# Patient Record
Sex: Female | Born: 1935 | Race: White | Hispanic: No | Smoking: Former smoker
Health system: Southern US, Academic
[De-identification: ages and names within clinical notes are randomized; demographics above are authoritative.]

## PROBLEM LIST (undated history)

## (undated) DIAGNOSIS — M4800 Spinal stenosis, site unspecified: Secondary | ICD-10-CM

## (undated) DIAGNOSIS — M419 Scoliosis, unspecified: Secondary | ICD-10-CM

## (undated) DIAGNOSIS — S022XXA Fracture of nasal bones, initial encounter for closed fracture: Secondary | ICD-10-CM

## (undated) DIAGNOSIS — M549 Dorsalgia, unspecified: Secondary | ICD-10-CM

## (undated) DIAGNOSIS — E041 Nontoxic single thyroid nodule: Secondary | ICD-10-CM

## (undated) DIAGNOSIS — E781 Pure hyperglyceridemia: Secondary | ICD-10-CM

## (undated) DIAGNOSIS — G4700 Insomnia, unspecified: Secondary | ICD-10-CM

## (undated) DIAGNOSIS — E559 Vitamin D deficiency, unspecified: Secondary | ICD-10-CM

## (undated) DIAGNOSIS — M1990 Unspecified osteoarthritis, unspecified site: Secondary | ICD-10-CM

## (undated) DIAGNOSIS — E785 Hyperlipidemia, unspecified: Secondary | ICD-10-CM

## (undated) DIAGNOSIS — M810 Age-related osteoporosis without current pathological fracture: Secondary | ICD-10-CM

## (undated) DIAGNOSIS — M5030 Other cervical disc degeneration, unspecified cervical region: Secondary | ICD-10-CM

## (undated) DIAGNOSIS — M48061 Spinal stenosis, lumbar region without neurogenic claudication: Secondary | ICD-10-CM

## (undated) HISTORY — PX: HX GALL BLADDER SURGERY/CHOLE: SHX55

## (undated) HISTORY — DX: Other cervical disc degeneration, unspecified cervical region: M50.30

## (undated) HISTORY — PX: HX DILATION AND CURETTAGE: SHX78

## (undated) HISTORY — DX: Hyperlipidemia, unspecified: E78.5

## (undated) HISTORY — PX: HX OTHER: 2100001105

## (undated) HISTORY — DX: Spinal stenosis, site unspecified: M48.00

## (undated) HISTORY — DX: Age-related osteoporosis without current pathological fracture: M81.0

## (undated) HISTORY — PX: HX COLONOSCOPY: 2100001147

## (undated) HISTORY — PX: HX TONSILLECTOMY: SHX27

## (undated) HISTORY — DX: Vitamin D deficiency, unspecified: E55.9

## (undated) HISTORY — DX: Pure hyperglyceridemia: E78.1

## (undated) HISTORY — PX: HX CATARACT REMOVAL: SHX102

## (undated) HISTORY — DX: Nontoxic single thyroid nodule: E04.1

## (undated) HISTORY — DX: Fracture of nasal bones, initial encounter for closed fracture: S02.2XXA

## (undated) HISTORY — PX: HX WISDOM TEETH EXTRACTION: SHX21

## (undated) HISTORY — DX: Dorsalgia, unspecified: M54.9

## (undated) HISTORY — PX: HX HIP SURGERY: 2100001310

## (undated) HISTORY — DX: Scoliosis, unspecified: M41.9

## (undated) HISTORY — DX: Spinal stenosis, lumbar region without neurogenic claudication: M48.061

## (undated) HISTORY — DX: Unspecified osteoarthritis, unspecified site: M19.90

## (undated) HISTORY — DX: Insomnia, unspecified: G47.00

## (undated) HISTORY — PX: KNEE ARTHROPLASTY: SHX992

## (undated) HISTORY — PX: TONSILLECTOMY: SUR1361

## (undated) HISTORY — PX: CHOLECYSTECTOMY: SHX55

---

## 2009-02-13 ENCOUNTER — Inpatient Hospital Stay (HOSPITAL_COMMUNITY): Payer: Self-pay | Admitting: Neurological Surgery

## 2011-04-10 ENCOUNTER — Ambulatory Visit (INDEPENDENT_AMBULATORY_CARE_PROVIDER_SITE_OTHER): Payer: Medicare Other

## 2011-04-25 ENCOUNTER — Other Ambulatory Visit (INDEPENDENT_AMBULATORY_CARE_PROVIDER_SITE_OTHER): Payer: Self-pay

## 2011-04-25 ENCOUNTER — Encounter (INDEPENDENT_AMBULATORY_CARE_PROVIDER_SITE_OTHER): Payer: Self-pay | Admitting: Neurological Surgery

## 2012-07-21 ENCOUNTER — Encounter (INDEPENDENT_AMBULATORY_CARE_PROVIDER_SITE_OTHER): Payer: Self-pay | Admitting: Medical

## 2012-08-10 ENCOUNTER — Encounter (INDEPENDENT_AMBULATORY_CARE_PROVIDER_SITE_OTHER): Payer: Self-pay | Admitting: Otolaryngology

## 2012-08-10 ENCOUNTER — Ambulatory Visit (INDEPENDENT_AMBULATORY_CARE_PROVIDER_SITE_OTHER): Payer: Medicare Other | Admitting: Otolaryngology

## 2012-08-10 VITALS — Resp 18 | Ht 62.0 in | Wt 120.0 lb

## 2012-08-10 NOTE — Progress Notes (Signed)
[facility] ENT AND AUDIOLOGY  [facility]  [address] Laurell Ladera [address] [address]  [phone]          Encounter Date: 08/10/2012  9:00 AM EDT      Name: Sheri Garza  Age: 77 y.o.  DOB: 1935-11-05  Sex: female    Chief Complaint:   Chief Complaint   Patient presents with   . Nasal Drainage       HPI    Sheri Garza is a 77 y.o. female who complains of nasal drainage.  She is having problems with thick mucus.  This is causing her difficulty sleeping at night.  She will feel like this is choking her.  She has not tried any nasal sprays or antihistamines. She does not take any diuretics.  She drinks only a couple of glasses of water per day. She reports having had 5-6 episodes that do not last long and is able to be stopped with tissues. She does have a history of nasal trauma in the past.     History  Family History   Problem Relation Age of Onset   . Diabetes     . HTN <20 y.o.     . Cancer       Past Medical History   Diagnosis Date   . Dry eye      Past Surgical History   Procedure Laterality Date   . Hx gall bladder surgery/chole     . Hx dilation and curettage     . Hx other       trigger finger, repair quad     There are no active problems to display for this patient.    Current Outpatient Prescriptions   Medication Sig   . multivitamin Oral Tablet Take 1 Tab by mouth Once a day     History   Smoking status   . Never Smoker    Smokeless tobacco   . Not on file     History   Alcohol Use No     History   Drug Use No       Review of Systems   Constitutional: Negative.    HENT: Negative.    Eyes: Positive for redness.        Positive for dry eye.   Respiratory: Negative.    Cardiovascular: Negative.    Gastrointestinal: Negative.    Genitourinary: Negative.    Musculoskeletal: Negative.    Skin: Negative.    Neurological: Negative.    Endo/Heme/Allergies: Negative.    Psychiatric/Behavioral: Negative.        Examination  Vitals: Resp 18   Ht 1.575 m (5\' 2" )   Wt 54.432 kg (120 lb)   BMI 21.94 kg/m2   Physical Exam   Constitutional: She appears well-developed and well-nourished. No distress.   HENT:   Head: Normocephalic and atraumatic.   Right Ear: Tympanic membrane, external ear and ear canal normal.   Left Ear: Tympanic membrane, external ear and ear canal normal.   Nose: Septal deviation (off to the right) present.   Mouth/Throat: Uvula is midline and oropharynx is clear and moist. Mucous membranes are dry. Oral lesions (located inside the right cheek from recurrent trauma) present. No oropharyngeal exudate.   Pale mucosa.   Eyes: Pupils are equal, round, and reactive to light. No scleral icterus.   Neck: No tracheal deviation present.   Pulm:  Observations:  no respiratory distress and no stridor.    Lymphadenopathy:  She has no cervical adenopathy.   Neurological: She is alert.   Skin: Skin is warm and dry.   Psychiatric: She has a normal mood and affect. Her behavior is normal.     .    Assessment and Plan  Sheri Garza was seen today for nasal drainage.    Thick nasal mucus    Epistaxis    DNS (deviated nasal septum)    I recommended the patient perform saline moisturization, use a humidifier in her room at bedtime, and drink 4-5 glasses of water per day. A nosebleed handout was provided. I advised her to try a nasal spray or Mucinex to help with thickened mucus. She was notified that nasal spray usage may cause irritation in the nasal septum and possibly nosebleeds.       Return in about 5 months (around 01/10/2013).    Weyman Rodney, LPN    I have reviewed and confirmed the ROS, PFSH, and all other elements documented by the LPN. The scribed portion of the progress note was scribed on my behalf and at my direction.  I have reviewed and attest to the accuracy of the note.    Kae Heller, MD 08/10/2012, 4:10 PM

## 2012-09-14 ENCOUNTER — Other Ambulatory Visit (INDEPENDENT_AMBULATORY_CARE_PROVIDER_SITE_OTHER): Payer: Self-pay | Admitting: Adult Reconstructive Orthopaedic Surgery

## 2012-09-15 ENCOUNTER — Ambulatory Visit (INDEPENDENT_AMBULATORY_CARE_PROVIDER_SITE_OTHER): Payer: Medicare Other | Admitting: Adult Reconstructive Orthopaedic Surgery

## 2012-09-15 ENCOUNTER — Encounter (INDEPENDENT_AMBULATORY_CARE_PROVIDER_SITE_OTHER): Payer: Self-pay | Admitting: Adult Reconstructive Orthopaedic Surgery

## 2012-09-15 VITALS — BP 124/68 | Temp 97.8°F | Ht 62.0 in | Wt 120.0 lb

## 2012-09-17 NOTE — Progress Notes (Signed)
[facility]  [address]  [address]  [place], [place] [address]  [phone]      FOLLOW-UP VISIT    PATIENT NAME:        Sheri Garza, Sheri Garza  VISIT IDENTIFICATION [id number]  MEDICAL RECORD NUMBER [id number]    DICTATING PHYSICIAN: Nile Dear. Grafton, DO   REFERRING PHYSICIAN:  Clemon Chambers, MD    DOB:   31-Jan-1935  DOS: 09/15/2012    cc:  Theron Arista Ang, MD    HISTORY OF PRESENT ILLNESS:  When I had last seen Ms. Dedominicis she had radiculopathy of the left lower  extremity and was seen by Dr. Arrie Aran, started in therapy, and she is doing  very well in regards to sciatic symptoms coming down her left leg. Now she is  having right-sided knee pain.  The knee pain only comes oddly enough when she  kneels down onto her knee. She has been cleaning up from road work that was  done around her house that effected her yard and she states that when she gets  down to pick things up it bothers her in the front of the knee. She denies any  pain that awakens her from sleeping. She denies any start-up pain symptoms.  She is here today for an evaluation in regards to her right knee pain.   Past Medical History  Current Outpatient Prescriptions   Medication Sig   . artificial tear, gonioscop-HPM, (GONIOSOL) 2.5 % Ophthalmic Drops Instill 1 Drop into both eyes Once a day   . multivitamin Oral Tablet Take 1 Tab by mouth Once a day     Allergies   Allergen Reactions   . Flagyl (Metronidazole)    . Gentamicin    . Iodine-Iodine Containing    . Other      Antihistamines and all caines   . Penicillins    . Tetracycline    . Vancomycin      Past Medical History   Diagnosis Date   . Dry eye      Past Surgical History   Procedure Laterality Date   . Hx gall bladder surgery/chole     . Hx dilation and curettage     . Hx other       trigger finger, repair quad     Family History   Problem Relation Age of Onset   . Diabetes     . HTN <20 y.o.     . Cancer       History     Social History   . Marital Status: Widowed     Spouse Name: N/A      Number of Children: N/A   . Years of Education: N/A     Social History Main Topics   . Smoking status: Never Smoker    . Smokeless tobacco: Not on file   . Alcohol Use: No   . Drug Use: No   . Sexually Active: Not on file     Other Topics Concern   . Not on file     Social History Narrative   . No narrative on file       REVIEW OF SYSTEMS:  Review of systems is positive for musculoskeletal complaints of knee pain.   History of chronic low back pain with paresthesias in the left lower  extremity.     PHYSICAL EXAMINATION:  BP 124/68   Temp(Src) 36.6 C (97.8 F)   Ht 1.575 m (5\' 2" )   Wt 54.432 kg (120  lb)   BMI 21.94 kg/m2    Orthopedic Evaluation:  Patient displays an normal/ antalgic/Trendelenburg gait.      Gross inspection reveals normal mechanical and anatomic axis of the lower  extremities with no appreciable leg length discrepancy.  No tenderness found  with AP or lateral compression testing of the pelvis.      Right hip shows full PROM (Forward flexion 0-115 degrees, internal rotation  0-35 degrees, external rotation 0-45 degrees). McCarthy Maneuvers are negative  for impingement. FABRE testing negative. No tenderness to palpation over the  greater trochanter.  Straight leg testing is negative for radiculopathy.   Compartments are supple.  Skin is clear.  +5/5 hip flexors/extenders, as well  as, knee flexors and extenders.  Intact sensory exam along all dermatomes.      Left hip shows full PROM (Forward flexion 0-115 degrees, internal rotation  0-35 degrees, external rotation 0-45 degrees). McCarthy Maneuvers are negative  for impingement. FABRE testing negative. No tenderness to palpation over the  greater trochanter.  Straight leg testing is negative for radiculopathy.   Compartments are supple.  Skin is clear.  +5/5 hip flexors/extenders, as well  as, knee flexors and extenders.  Intact sensory exam along all dermatomes.      Right knee shows point tenderness to palpation with the knee flexed at 45   degrees over the distal end of the lateral femoral condyle and proximal tibia.       Left knee shows full PROM (0-120 degrees).  Patella tracks midline without  instability.  Clark patella-femoral test is negative for crepitus and pain.   No effusion is present. Lachman test is negative. Posterior Drawer test is  negative.  Collateral ligamental testing is stable at 30 degrees of extension,  mid-flexion, and terminal flexion. McMurray testing is negative. No Baker's  cyst is present. Skin is clear.  Normoreflexic.      Right Ankle shows full PROM in plantar and dorsiflexion.  Stable to ligamental  testing. Skin is clear. +5/5 motor strength to plantar/dorsiflexion,  inversion/eversion, and EHL. Intact sensory to light-touch. +2/4 dorsalis  pedis and posterior tibial pulse.      Left Ankle shows full PROM in plantar and dorsiflexion. Stable to ligamental  testing. Skin is clear. +5/5 motor strength to plantar/dorsiflexion,  inversion/eversion, and EHL. Intact sensory to light-touch. +2/4 dorsalis  pedis and posterior tibial pulse.      X-RAYS:  X-rays of the knees were obtained today. This does show some evidence of  arthritic changes beginning in the knee; I grade these more mild. I could see  some sclerotic changes seen in the proximal and lateral tibia.  There are  osteophytes seen and these are best seen on the merchant view.     ASSESSMENT:  Right knee osteoarthritis.     PLAN:  At this time, I have discussed with Mrs. Hevia her radiographic and clinical  findings. Obviously she is very functional and having no pain with activity  right now, therefore, I am not recommending anything.  If her pain becomes  worse other than just kneeling on her knee becomes worse with activities, I  have recommended initiating and anti-inflammatory as well as we can initiate  injection management.  I reviewed these treatments and their benefits in  detail. She will follow up with [place] on a p.r.n. basis.  Nile Dear. Toni Amend, DO                                          d:  09/15/2012 14:26:43  t:  09/17/2012 09:24:40  jr  doc#:  [id number]  voice#:  [id number]  <START FOOTER> Page 2 of 3  <end footer>

## 2013-01-11 ENCOUNTER — Encounter (INDEPENDENT_AMBULATORY_CARE_PROVIDER_SITE_OTHER): Payer: Medicare Other | Admitting: Otolaryngology

## 2013-02-03 ENCOUNTER — Encounter (INDEPENDENT_AMBULATORY_CARE_PROVIDER_SITE_OTHER): Payer: Self-pay | Admitting: Otolaryngology

## 2013-02-03 ENCOUNTER — Ambulatory Visit (INDEPENDENT_AMBULATORY_CARE_PROVIDER_SITE_OTHER): Payer: Medicare Other | Admitting: Otolaryngology

## 2013-02-03 VITALS — Resp 20 | Ht 62.0 in | Wt 120.0 lb

## 2013-02-03 DIAGNOSIS — R040 Epistaxis: Secondary | ICD-10-CM

## 2013-02-03 DIAGNOSIS — R0982 Postnasal drip: Principal | ICD-10-CM

## 2013-02-03 DIAGNOSIS — J342 Deviated nasal septum: Secondary | ICD-10-CM

## 2013-02-03 NOTE — Progress Notes (Signed)
[facility]  [facility]  [address]   [place] [id number]  [phone]          Encounter Date: 02/03/2013 11:00 AM EST      Name: Sheri Garza  Age: 78 y.o.  DOB: 04-Feb-1935  Sex: female    Chief Complaint:   Chief Complaint   Patient presents with    Epistaxis    Post Nasal Drip       HPI    Sheri Garza follows up today for thick post nasal drip and epistaxis. She hasn't had a nosebleed for the past few months. She complains of PND. She is using nasal saline BID and PRN bacitracin. She is not using an antihistamine or a nasal spray.  She is not terribly bothered by the post nasal drip.  She does not want to do any medication for the condition because she doesn't like to take medications.    Review of Systems   Constitutional: Negative for fever, chills and weight loss.       Examination  Vitals: Resp 20   Ht 1.575 m (5\' 2" )   Wt 54.432 kg (120 lb)   BMI 21.94 kg/m2  Physical Exam   Constitutional: She appears well-developed and well-nourished. No distress.   HENT:   Head: Normocephalic and atraumatic.   Right Ear: Tympanic membrane, external ear and ear canal normal. No middle ear effusion.   Left Ear: Tympanic membrane and external ear normal.  No middle ear effusion.   Nose: Mucosal edema (tissues are pale as well) and septal deviation (to the right) present. No epistaxis.   Mouth/Throat: Uvula is midline, oropharynx is clear and moist and mucous membranes are normal. No oropharyngeal exudate.   Eyes: Pupils are equal, round, and reactive to light. No scleral icterus.   Neck: Trachea normal. Neck supple. No tracheal deviation present.   Pulm:  Observations:  no respiratory distress and no stridor.    Lymphadenopathy:     She has no cervical adenopathy.   Neurological: She is alert. GCS eye subscore is 4. GCS verbal subscore is 5. GCS motor subscore is 6.   Skin: Skin is warm and dry.   Psychiatric: She has a normal mood and affect. Her behavior is normal.     .    Assessment and  Plan  Sheri Garza was seen today for epistaxis and post nasal drip.    PND (post-nasal drip)    Epistaxis    DNS (deviated nasal septum)        Continue current treatment.    At this point since she is doing well, she can return to the clinic on an as needed basis.    Sheri RodneyShellie Lexxus Underhill, LPN    I have reviewed and confirmed the ROS, PFSH, and all other elements documented by the LPN. The scribed portion of the progress note was scribed on my behalf and at my direction.  I have reviewed and attest to the accuracy of the note.    Kae HellerKevin S Oxley, MD 02/03/2013, 11:32 AM

## 2013-02-03 NOTE — Patient Instructions (Signed)

## 2014-11-24 ENCOUNTER — Ambulatory Visit (INDEPENDENT_AMBULATORY_CARE_PROVIDER_SITE_OTHER): Payer: Self-pay | Admitting: OTOLARYNGOLOGY

## 2014-12-05 DIAGNOSIS — E042 Nontoxic multinodular goiter: Secondary | ICD-10-CM

## 2014-12-11 ENCOUNTER — Encounter (INDEPENDENT_AMBULATORY_CARE_PROVIDER_SITE_OTHER): Payer: Self-pay | Admitting: OTOLARYNGOLOGY

## 2014-12-11 ENCOUNTER — Ambulatory Visit (INDEPENDENT_AMBULATORY_CARE_PROVIDER_SITE_OTHER): Payer: Medicare Other | Admitting: OTOLARYNGOLOGY

## 2014-12-11 VITALS — BP 110/70 | Temp 98.9°F | Ht 62.0 in | Wt 126.0 lb

## 2014-12-11 DIAGNOSIS — R0982 Postnasal drip: Principal | ICD-10-CM

## 2014-12-11 MED ORDER — AZELASTINE 137 MCG (0.1 %) NASAL SPRAY AEROSOL
2.00 | INHALATION_SPRAY | Freq: Two times a day (BID) | NASAL | Status: AC
Start: 2014-12-11 — End: 2015-01-10

## 2014-12-11 NOTE — H&P (Signed)
[facility]  DEPARTMENT OF OTOLARYNGOLOGY - HEAD AND NECK SURGERY  CLINIC H&P    Name: Sheri Garza, 79 y.o. female  MRN: [id number]  Date of Birth: 1935-04-04  Date of Service: 12/11/2014    Chief Complaint:    Chief Complaint   Patient presents with    Post Nasal Drip     History of Present Illness: This is a 79 y.o. female with PND for over a year; tried saline rinses  Past Medical History:  Past Medical History   Diagnosis Date    Dry eye          Past Surgical History:  Past Surgical History   Procedure Laterality Date    Hx gall bladder surgery/chole      Hx dilation and curettage      Hx other       trigger finger, repair quad         Medications:  Outpatient Prescriptions Marked as Taking for the 12/11/14 encounter (Office Visit) with Maree Erie, MD   Medication Sig    artificial tear, gonioscop-HPM, (GONIOSOL) 2.5 % Ophthalmic Drops Instill 1 Drop into both eyes Once a day    azelastine (ASTELIN) 137 mcg (0.1 %) Nasal Aerosol, Spray 2 Sprays by Each Nostril route Twice daily for 30 days Use in each nostril as directed      Family History:  Family History   Problem Relation Age of Onset    Diabetes      HTN <20 y.o.      Cancer           Social History:  Social History     Occupational History    Not on file.     Social History Main Topics    Smoking status: Never Smoker     Smokeless tobacco: Never Used    Alcohol Use: No    Drug Use: No    Sexual Activity: Not on file     Allergies:  Allergies   Allergen Reactions    Flagyl [Metronidazole]     Gentamicin     Iodine And Iodide Containing Products     Other      Antihistamines and all caines    Penicillins     Tetracycline     Vancomycin        Review of Systems:    Do you have any fevers: no   Any weight change: no   Change in your vision: no    Chest Pain: no   Shortness of Breath: no   Stomach pain: no   Urinary difficulity: no   Joint Pain: no   Skin Problems: no   Weakness or Numbness: no   Easy Bruising or  Bleeding: no   Excessive Thirst: no   Seasonal Allergies: no    All other systems reviewed and found to be negative.    Physical Exam:  BP 110/70 mmHg   Temp(Src) 37.2 C (98.9 F)   Ht 1.575 m ( )   Wt 57.153 kg (126 lb)   BMI 23.04 kg/m2  General Appearance: Pleasant, cooperative, healthy, and in no acute distress.  Eyes: Conjunctivae/corneas clear, PERRLA, EOM's intact.  Head and Face: Normocephalic, atraumatic.  Face symmetric, no obvious lesions.   Pinnae: Normal shape and position.   External auditory canals:  Patent without inflammation.  Tympanic membranes:  Intact, translucent, midposition, middle ear aerated.  Nose:  External pyramid midline. Septum midline. Mucosa normal. No purulence, polyps, or crusts. Pale  turbinates, clear mucus  Oral Cavity/Oropharynx: No mucosal lesions, masses, or pharyngeal asymmetry.  Good dentition  Hypopharynx/Larynx: Indirect mirror laryngoscopy revealed no hypopharyngeal or laryngeal masses or lesions, normal laryngeal mobility, and voice normal.  Neck:  No palpable , salivary gland, or neck masses. Nodular thyroid  Heme/Lymph:  No cervical adenopathy.  Cardiovascular:  Good perfusion of upper extremities.  No cyanosis of the hands or fingers.  Lungs: No apparent stridorous breathing. No acute distress.  Skin: Skin warm and dry.  Neurologic: Cranial nerves:  grossly intact.  Psychiatric:  Alert and oriented x 3.    Review of Information:  N/A    Procedure:  No notes on file    Assessment:   Sheri Garza is a 79 y.o. female who presents clear nasal discharge      ICD-10-CM    1. PND (post-nasal drip) R09.82          Plan:  Orders Placed This Encounter    azelastine (ASTELIN) 137 mcg (0.1 %) Nasal Aerosol, Spray   discussed allergy tests and imaging      Maree ErieJoedy Angelina Neece, MD  12/11/2014, 13:52    Maree ErieJoedy Markelle Asaro, MD    CC:    PCP Clemon ChambersPeter Ang, MD  [address]  [place] [id number]   Referring Provider Self, Referral  No address on file

## 2015-01-12 ENCOUNTER — Encounter (INDEPENDENT_AMBULATORY_CARE_PROVIDER_SITE_OTHER): Payer: Medicare Other | Admitting: OTOLARYNGOLOGY

## 2015-02-09 DIAGNOSIS — E041 Nontoxic single thyroid nodule: Secondary | ICD-10-CM

## 2015-03-10 ENCOUNTER — Emergency Department (HOSPITAL_COMMUNITY): Payer: Medicare Other

## 2015-03-10 ENCOUNTER — Inpatient Hospital Stay (HOSPITAL_COMMUNITY)
Admission: EM | Admit: 2015-03-10 | Discharge: 2015-03-12 | DRG: 872 | Disposition: A | Discharge status: Home / Self Care | Payer: Medicare Other | Attending: Internal Medicine | Admitting: Internal Medicine

## 2015-03-10 ENCOUNTER — Encounter (HOSPITAL_COMMUNITY): Payer: Self-pay | Admitting: Emergency Medicine

## 2015-03-10 DIAGNOSIS — A419 Sepsis, unspecified organism: Principal | ICD-10-CM | POA: Diagnosis present

## 2015-03-10 DIAGNOSIS — E876 Hypokalemia: Secondary | ICD-10-CM | POA: Diagnosis present

## 2015-03-10 DIAGNOSIS — Z888 Allergy status to other drugs, medicaments and biological substances status: Secondary | ICD-10-CM

## 2015-03-10 DIAGNOSIS — Z96659 Presence of unspecified artificial knee joint: Secondary | ICD-10-CM | POA: Diagnosis present

## 2015-03-10 DIAGNOSIS — Z9049 Acquired absence of other specified parts of digestive tract: Secondary | ICD-10-CM

## 2015-03-10 DIAGNOSIS — Z881 Allergy status to other antibiotic agents status: Secondary | ICD-10-CM

## 2015-03-10 DIAGNOSIS — R509 Fever, unspecified: Secondary | ICD-10-CM

## 2015-03-10 DIAGNOSIS — R197 Diarrhea, unspecified: Secondary | ICD-10-CM

## 2015-03-10 DIAGNOSIS — R112 Nausea with vomiting, unspecified: Secondary | ICD-10-CM

## 2015-03-10 DIAGNOSIS — Z880 Allergy status to penicillin: Secondary | ICD-10-CM

## 2015-03-10 DIAGNOSIS — J069 Acute upper respiratory infection, unspecified: Secondary | ICD-10-CM

## 2015-03-10 DIAGNOSIS — Z91041 Radiographic dye allergy status: Secondary | ICD-10-CM

## 2015-03-10 DIAGNOSIS — Z91013 Allergy to seafood: Secondary | ICD-10-CM

## 2015-03-10 DIAGNOSIS — Z7401 Bed confinement status: Secondary | ICD-10-CM

## 2015-03-10 DIAGNOSIS — E860 Dehydration: Secondary | ICD-10-CM

## 2015-03-10 DIAGNOSIS — J101 Influenza due to other identified influenza virus with other respiratory manifestations: Secondary | ICD-10-CM | POA: Diagnosis present

## 2015-03-10 DIAGNOSIS — Z87891 Personal history of nicotine dependence: Secondary | ICD-10-CM

## 2015-03-10 LAB — CBC WITH DIFFERENTIAL/PLATELET
BASOS PCT: 0 %
Basophils Absolute: 0 10*3/uL (ref 0.0–0.1)
Eosinophils Absolute: 0 10*3/uL (ref 0.0–0.7)
Eosinophils Relative: 0 %
HEMATOCRIT: 42.1 % (ref 36.0–46.0)
HEMOGLOBIN: 13.6 g/dL (ref 12.0–15.0)
Lymphocytes Relative: 7 %
Lymphs Abs: 0.5 10*3/uL — ABNORMAL LOW (ref 0.7–4.0)
MCH: 27.6 pg (ref 26.0–34.0)
MCHC: 32.3 g/dL (ref 30.0–36.0)
MCV: 85.4 fL (ref 78.0–100.0)
MONOS PCT: 11 %
Monocytes Absolute: 0.8 10*3/uL (ref 0.1–1.0)
NEUTROS ABS: 6.6 10*3/uL (ref 1.7–7.7)
NEUTROS PCT: 82 %
Platelets: 159 10*3/uL (ref 150–400)
RBC: 4.93 MIL/uL (ref 3.87–5.11)
RDW: 13 % (ref 11.5–15.5)
WBC: 8 10*3/uL (ref 4.0–10.5)

## 2015-03-10 LAB — URINALYSIS, ROUTINE W REFLEX MICROSCOPIC
Bilirubin Urine: NEGATIVE
Glucose, UA: NEGATIVE mg/dL
KETONES UR: 15 mg/dL — AB
LEUKOCYTES UA: NEGATIVE
NITRITE: NEGATIVE
PH: 5.5 (ref 5.0–8.0)
Protein, ur: NEGATIVE mg/dL

## 2015-03-10 LAB — COMPREHENSIVE METABOLIC PANEL
ALT: 14 U/L (ref 14–54)
AST: 24 U/L (ref 15–41)
Albumin: 3.5 g/dL (ref 3.5–5.0)
Alkaline Phosphatase: 90 U/L (ref 38–126)
Anion gap: 8 (ref 5–15)
BUN: 15 mg/dL (ref 6–20)
CHLORIDE: 104 mmol/L (ref 101–111)
CO2: 26 mmol/L (ref 22–32)
CREATININE: 0.58 mg/dL (ref 0.44–1.00)
Calcium: 8.5 mg/dL — ABNORMAL LOW (ref 8.9–10.3)
GFR calc non Af Amer: >60 mL/min (ref >60)
Glucose, Bld: 97 mg/dL (ref 65–99)
Potassium: 3.4 mmol/L — ABNORMAL LOW (ref 3.5–5.1)
SODIUM: 138 mmol/L (ref 135–145)
Total Bilirubin: 0.7 mg/dL (ref 0.3–1.2)
Total Protein: 6.3 g/dL — ABNORMAL LOW (ref 6.5–8.1)

## 2015-03-10 LAB — URINE MICROSCOPIC-ADD ON

## 2015-03-10 LAB — TROPONIN I

## 2015-03-10 LAB — LIPASE, BLOOD: Lipase: 24 U/L (ref 11–51)

## 2015-03-10 LAB — LACTIC ACID, PLASMA: Lactic Acid, Venous: 0.9 mmol/L (ref 0.5–2.0)

## 2015-03-10 MED ORDER — ACETAMINOPHEN 325 MG PO TABS
650.0000 mg | ORAL_TABLET | Freq: Once | ORAL | Status: AC
  Administered 2015-03-10: 650 mg via ORAL
  Filled 2015-03-10: qty 2

## 2015-03-10 MED ORDER — POTASSIUM CHLORIDE CRYS ER 20 MEQ PO TBCR
40.0000 meq | EXTENDED_RELEASE_TABLET | Freq: Once | ORAL | Status: AC
  Administered 2015-03-10: 40 meq via ORAL
  Filled 2015-03-10: qty 2

## 2015-03-10 MED ORDER — SODIUM CHLORIDE 0.9 % IV BOLUS (SEPSIS)
500.0000 mL | Freq: Once | INTRAVENOUS | Status: AC
  Administered 2015-03-10: 500 mL via INTRAVENOUS

## 2015-03-10 MED ORDER — ONDANSETRON HCL 4 MG/2ML IJ SOLN: 4.0000 mg | INTRAMUSCULAR | Status: DC | PRN

## 2015-03-10 MED ORDER — SODIUM CHLORIDE 0.9 % IV SOLN
INTRAVENOUS | Status: DC
  Administered 2015-03-10: 75 mL/h via INTRAVENOUS

## 2015-03-10 NOTE — ED Notes (Signed)
Patient ambulatory to restroom with steady gait, clean catch instructions given and advised pt to bring specimen back to room as well.  

## 2015-03-10 NOTE — ED Provider Notes (Signed)
CSN: [id number]     Arrival date & time 03/10/15  2014 History   First MD Initiated Contact with Patient 03/10/15 2040     Chief Complaint  Patient presents with  . Nausea  . Generalized Body Aches      HPI  Pt was seen at 2050.  Per pt, c/o gradual onset and persistence of constant runny/stuffy nose, sinus congestion, and cough for the past 2-3 days. Has been associated with home temps to "99," generalized body aches/fatigue, as well as multiple intermittent episodes of N/V/D. Pt has had poor PO intake due to her symptoms. Others in family with similar symptoms. Denies fevers 101+, no rash, no CP/SOB, no neck or back pain, no abd pain, no black or blood in stools or emesis, no focal motor weakness, no tingling/numbness in extremities.     History reviewed. No pertinent past medical history.   Past Surgical History  Procedure Laterality Date  . Knee arthroplasty    . Cholecystectomy    . Tonsillectomy      Social History  Substance Use Topics  . Smoking status: Former Games developer  . Smokeless tobacco: Never Used  . Alcohol Use: No    Review of Systems ROS: Statement: All systems negative except as marked or noted in the HPI; Constitutional: Negative for fever and chills. +generalized weakness/fatigue, generalized body aches. ; ; Eyes: Negative for eye pain, redness and discharge. ; ; ENMT: Negative for ear pain, hoarseness, nasal congestion, sinus pressure and sore throat. ; ; Cardiovascular: Negative for chest pain, palpitations, diaphoresis, dyspnea and peripheral edema. ; ; Respiratory: +cough. Negative for wheezing and stridor. ; ; Gastrointestinal: +N/V/D, poor PO intake. Negative for abdominal pain, blood in stool, hematemesis, jaundice and rectal bleeding. . ; ; Genitourinary: Negative for dysuria, flank pain and hematuria. ; ; Musculoskeletal: Negative for back pain and neck pain. Negative for swelling and trauma.; ; Skin: Negative for pruritus, rash, abrasions, blisters, bruising  and skin lesion.; ; Neuro: Negative for headache, lightheadedness and neck stiffness. Negative for altered level of consciousness , altered mental status, extremity weakness, paresthesias, involuntary movement, seizure and syncope.      Allergies  Antihistamines, chlorpheniramine-type; Gentamycin; Iodine; Lidocaine; Shellfish allergy; Tetracyclines & related; Vancomycin; Metronidazole; and Penicillins  Home Medications   Prior to Admission medications   Not on File   BP 107/49 mmHg  Pulse 95  Temp(Src) 99.8 F (37.7 C) (Oral)  Resp 17  Ht 5\' 2"  (1.575 m)  Wt 126 lb (57.153 kg)  BMI 23.04 kg/m2  SpO2 95%   22:42:57 Orthostatic Vital Signs RC  Orthostatic Lying  - BP- Lying: 107/57 mmHg ; Pulse- Lying: 112  Orthostatic Sitting - BP- Sitting: 115/53 mmHg ; Pulse- Sitting: 119  Orthostatic Standing at 0 minutes - BP- Standing at 0 minutes: 114/65 mmHg ; Pulse- Standing at 0 minutes: 115     Patient Vitals for the past 24 hrs:  BP Temp Temp src Pulse Resp SpO2 Height Weight  03/10/15 2313 (!) 108/49 mmHg - - 102 (!) 27 90 % - -  03/10/15 2259 - 101.3 F (38.5 C) Oral - - - - -  03/10/15 2138 - 100.1 F (37.8 C) Oral - - - - -  03/10/15 2020 (!) 107/49 mmHg 99.8 F (37.7 C) Oral 95 17 95 % 5\' 2"  (1.575 m) 126 lb (57.153 kg)     Physical Exam  2055: Physical examination:  Nursing notes reviewed; Vital signs and O2 SAT reviewed;  Constitutional: Well  developed, Well nourished, In no acute distress; Head:  Normocephalic, atraumatic; Eyes: EOMI, PERRL, No scleral icterus; ENMT: Mouth and pharynx normal, Mucous membranes dry; Neck: Supple, Full range of motion, No lymphadenopathy; Cardiovascular: Regular rate and rhythm, No gallop; Respiratory: Breath sounds clear & equal bilaterally, No wheezes.  Speaking full sentences with ease, Normal respiratory effort/excursion; Chest: Nontender, Movement normal; Abdomen: Soft, Nontender, Nondistended, Normal bowel sounds; Genitourinary: No CVA  tenderness; Extremities: Pulses normal, No tenderness, No edema, No calf edema or asymmetry.; Neuro: AA&Ox3, Major CN grossly intact.  Speech clear. No gross focal motor or sensory deficits in extremities.; Skin: Color normal, Warm, Dry.   ED Course  Procedures (including critical care time) Labs Review  Imaging Review  I have personally reviewed and evaluated these images and lab results as part of my medical decision-making.   EKG Interpretation None      MDM  MDM Reviewed: previous chart, nursing note and vitals Reviewed previous: labs and ECG Interpretation: labs, ECG and x-ray   Results for orders placed or performed during the hospital encounter of 03/10/15  Comprehensive metabolic panel  Result Value Ref Range   Sodium 138 135 - 145 mmol/L   Potassium 3.4 (L) 3.5 - 5.1 mmol/L   Chloride 104 101 - 111 mmol/L   CO2 26 22 - 32 mmol/L   Glucose, Bld 97 65 - 99 mg/dL   BUN 15 6 - 20 mg/dL   Creatinine, Ser 1.61 0.44 - 1.00 mg/dL   Calcium 8.5 (L) 8.9 - 10.3 mg/dL   Total Protein 6.3 (L) 6.5 - 8.1 g/dL   Albumin 3.5 3.5 - 5.0 g/dL   AST 24 15 - 41 U/L   ALT 14 14 - 54 U/L   Alkaline Phosphatase 90 38 - 126 U/L   Total Bilirubin 0.7 0.3 - 1.2 mg/dL   GFR calc non Af Amer >60 >60 mL/min   GFR calc Af Amer >60 >60 mL/min   Anion gap 8 5 - 15  Lipase, blood  Result Value Ref Range   Lipase 24 11 - 51 U/L  Troponin I  Result Value Ref Range   Troponin I <0.03 <0.031 ng/mL  Lactic acid, plasma  Result Value Ref Range   Lactic Acid, Venous 0.9 0.5 - 2.0 mmol/L  CBC with Differential  Result Value Ref Range   WBC 8.0 4.0 - 10.5 K/uL   RBC 4.93 3.87 - 5.11 MIL/uL   Hemoglobin 13.6 12.0 - 15.0 g/dL   HCT 09.6 04.5 - 40.9 %   MCV 85.4 78.0 - 100.0 fL   MCH 27.6 26.0 - 34.0 pg   MCHC 32.3 30.0 - 36.0 g/dL   RDW 81.1 91.4 - 78.2 %   Platelets 159 150 - 400 K/uL   Neutrophils Relative % 82 %   Neutro Abs 6.6 1.7 - 7.7 K/uL   Lymphocytes Relative 7 %   Lymphs Abs  0.5 (L) 0.7 - 4.0 K/uL   Monocytes Relative 11 %   Monocytes Absolute 0.8 0.1 - 1.0 K/uL   Eosinophils Relative 0 %   Eosinophils Absolute 0.0 0.0 - 0.7 K/uL   Basophils Relative 0 %   Basophils Absolute 0.0 0.0 - 0.1 K/uL  Urinalysis, Routine w reflex microscopic  Result Value Ref Range   Color, Urine YELLOW YELLOW   APPearance CLEAR CLEAR   Specific Gravity, Urine >1.030 (H) 1.005 - 1.030   pH 5.5 5.0 - 8.0   Glucose, UA NEGATIVE NEGATIVE mg/dL   Hgb  urine dipstick TRACE (A) NEGATIVE   Bilirubin Urine NEGATIVE NEGATIVE   Ketones, ur 15 (A) NEGATIVE mg/dL   Protein, ur NEGATIVE NEGATIVE mg/dL   Nitrite NEGATIVE NEGATIVE   Leukocytes, UA NEGATIVE NEGATIVE  Urine microscopic-add on  Result Value Ref Range   Squamous Epithelial / LPF 6-30 (A) NONE SEEN   WBC, UA 6-30 0 - 5 WBC/hpf   RBC / HPF 0-5 0 - 5 RBC/hpf   Bacteria, UA RARE (A) NONE SEEN   Dg Abd Acute W/chest 03/10/2015  CLINICAL DATA:  Acute onset of productive cough and upper abdominal pain. Generalized body aches, nausea, vomiting and diarrhea. Generalized weakness. Initial encounter. EXAM: DG ABDOMEN ACUTE W/ 1V CHEST COMPARISON:  None. FINDINGS: The lungs are well-aerated. Mild vascular congestion is noted. Mild bilateral atelectasis is seen. Scarring is noted at the lung apices. There is no evidence of pleural effusion or pneumothorax. The cardiomediastinal silhouette is borderline normal in size. The visualized bowel gas pattern is unremarkable. Scattered stool and air are seen within the colon; there is no evidence of small bowel dilatation to suggest obstruction. No free intra-abdominal air is identified on the provided upright view. Clips are noted within the right upper quadrant, reflecting prior cholecystectomy. No acute osseous abnormalities are seen; the sacroiliac joints are unremarkable in appearance. Mild degenerative change is noted along the lumbar spine. IMPRESSION: 1. Unremarkable bowel gas pattern; no free  intra-abdominal air seen. Small amount of stool noted in the colon. 2. Mild vascular congestion noted. Mild bilateral atelectasis seen. Scarring at the lung apices. Electronically Signed   By: Roanna Raider M.D.   On: 03/10/2015 22:25    2055:  Exam completed and plan of care explained to pt and family. Pt refusing interventions. Explained rationale for interventions and pt verb understanding.  Pt continues to refuse interventions, stating her family "made me come" and "I don't want to be here and have any of this." Pt's son insistent pt stay for evaluation/workup. Mother now agreeable.  2145:  Pt again refusing interventions (temp, urine, IV, anti-emetic, etc). States she "doesn't want to be here." Explained rationale for plan of care. Pt again verb understanding, stating she "doesn't want that stuff." I queried pt's goals of care for her ED visit today. She stated she "didn't have any because he made me come." Pt son insistent he wants pt "checked out." Pt now agreeable again to proceed with plan of care.   2350:  APAP given for fever. Udip appears contaminated; UC is pending. Pt persistently tachycardic despite APAP and IVF; will admit. Dx and testing d/w pt and family.  Questions answered.  Verb understanding, agreeable to admit. T/C to [facility] Dr. Antionette Char, case discussed, including:  HPI, pertinent PM/SHx, VS/PE, dx testing, ED course and treatment:  Agreeable to admit, requests to write temporary orders, obtain tele bed to team APAdmits.   Samuel Jester, DO 03/13/15 1232

## 2015-03-10 NOTE — ED Notes (Signed)
Nausea, diarrhea, general body aches, weakness since yesterday

## 2015-03-11 ENCOUNTER — Encounter (HOSPITAL_COMMUNITY): Payer: Self-pay | Admitting: Family Medicine

## 2015-03-11 DIAGNOSIS — E876 Hypokalemia: Secondary | ICD-10-CM | POA: Diagnosis present

## 2015-03-11 DIAGNOSIS — J069 Acute upper respiratory infection, unspecified: Secondary | ICD-10-CM | POA: Insufficient documentation

## 2015-03-11 DIAGNOSIS — E860 Dehydration: Secondary | ICD-10-CM | POA: Diagnosis present

## 2015-03-11 DIAGNOSIS — R509 Fever, unspecified: Secondary | ICD-10-CM | POA: Diagnosis present

## 2015-03-11 DIAGNOSIS — Z87891 Personal history of nicotine dependence: Secondary | ICD-10-CM | POA: Diagnosis not present

## 2015-03-11 DIAGNOSIS — Z96659 Presence of unspecified artificial knee joint: Secondary | ICD-10-CM | POA: Diagnosis present

## 2015-03-11 DIAGNOSIS — Z7401 Bed confinement status: Secondary | ICD-10-CM | POA: Diagnosis not present

## 2015-03-11 DIAGNOSIS — A419 Sepsis, unspecified organism: Secondary | ICD-10-CM | POA: Diagnosis present

## 2015-03-11 DIAGNOSIS — Z91041 Radiographic dye allergy status: Secondary | ICD-10-CM | POA: Diagnosis not present

## 2015-03-11 DIAGNOSIS — R112 Nausea with vomiting, unspecified: Secondary | ICD-10-CM | POA: Insufficient documentation

## 2015-03-11 DIAGNOSIS — Z880 Allergy status to penicillin: Secondary | ICD-10-CM | POA: Diagnosis not present

## 2015-03-11 DIAGNOSIS — Z91013 Allergy to seafood: Secondary | ICD-10-CM | POA: Diagnosis not present

## 2015-03-11 DIAGNOSIS — Z881 Allergy status to other antibiotic agents status: Secondary | ICD-10-CM | POA: Diagnosis not present

## 2015-03-11 DIAGNOSIS — J101 Influenza due to other identified influenza virus with other respiratory manifestations: Secondary | ICD-10-CM | POA: Diagnosis present

## 2015-03-11 DIAGNOSIS — Z888 Allergy status to other drugs, medicaments and biological substances status: Secondary | ICD-10-CM | POA: Diagnosis not present

## 2015-03-11 DIAGNOSIS — Z9049 Acquired absence of other specified parts of digestive tract: Secondary | ICD-10-CM | POA: Diagnosis not present

## 2015-03-11 DIAGNOSIS — R197 Diarrhea, unspecified: Secondary | ICD-10-CM

## 2015-03-11 LAB — LACTIC ACID, PLASMA: Lactic Acid, Venous: 0.7 mmol/L (ref 0.5–2.0)

## 2015-03-11 LAB — PROCALCITONIN: Procalcitonin: <0.1 ng/mL

## 2015-03-11 LAB — APTT: aPTT: 28 seconds (ref 24–37)

## 2015-03-11 LAB — BRAIN NATRIURETIC PEPTIDE: B NATRIURETIC PEPTIDE 5: 40 pg/mL (ref 0.0–100.0)

## 2015-03-11 LAB — GLUCOSE, CAPILLARY: Glucose-Capillary: 75 mg/dL (ref 65–99)

## 2015-03-11 LAB — MAGNESIUM: Magnesium: 1.8 mg/dL (ref 1.7–2.4)

## 2015-03-11 LAB — PROTIME-INR
INR: 1.07 (ref 0.00–1.49)
PROTHROMBIN TIME: 14.1 s (ref 11.6–15.2)

## 2015-03-11 LAB — INFLUENZA PANEL BY PCR (TYPE A & B)
H1N1 flu by pcr: NOT DETECTED
INFLAPCR: POSITIVE — AB
INFLBPCR: NEGATIVE

## 2015-03-11 MED ORDER — ACETAMINOPHEN 650 MG RE SUPP: 650.0000 mg | Freq: Four times a day (QID) | RECTAL | Status: DC | PRN

## 2015-03-11 MED ORDER — POTASSIUM CHLORIDE IN NACL 20-0.9 MEQ/L-% IV SOLN
INTRAVENOUS | Status: AC
  Administered 2015-03-11 (×2): via INTRAVENOUS

## 2015-03-11 MED ORDER — SODIUM CHLORIDE 0.9% FLUSH
3.0000 mL | Freq: Two times a day (BID) | INTRAVENOUS | Status: DC
  Administered 2015-03-11: 3 mL via INTRAVENOUS

## 2015-03-11 MED ORDER — ENOXAPARIN SODIUM 40 MG/0.4ML ~~LOC~~ SOLN: 40.0000 mg | SUBCUTANEOUS | Status: DC

## 2015-03-11 MED ORDER — BISACODYL 5 MG PO TBEC: 5.0000 mg | DELAYED_RELEASE_TABLET | Freq: Every day | ORAL | Status: DC | PRN

## 2015-03-11 MED ORDER — POTASSIUM CHLORIDE IN NACL 20-0.9 MEQ/L-% IV SOLN
INTRAVENOUS | Status: AC
  Administered 2015-03-11: via INTRAVENOUS

## 2015-03-11 MED ORDER — HYPROMELLOSE (GONIOSCOPIC) 2.5 % OP SOLN
1.0000 [drp] | Freq: Three times a day (TID) | OPHTHALMIC | Status: DC
  Administered 2015-03-12: 1 [drp] via OPHTHALMIC
  Filled 2015-03-11: qty 15

## 2015-03-11 MED ORDER — OSELTAMIVIR PHOSPHATE 30 MG PO CAPS
30.0000 mg | ORAL_CAPSULE | Freq: Two times a day (BID) | ORAL | Status: DC
  Administered 2015-03-11 – 2015-03-12 (×4): 30 mg via ORAL
  Filled 2015-03-11 (×9): qty 1

## 2015-03-11 MED ORDER — POLYVINYL ALCOHOL 1.4 % OP SOLN
1.0000 [drp] | Freq: Three times a day (TID) | OPHTHALMIC | Status: DC
  Filled 2015-03-11: qty 15

## 2015-03-11 MED ORDER — OSELTAMIVIR PHOSPHATE 75 MG PO CAPS: 75.0000 mg | ORAL_CAPSULE | Freq: Once | ORAL | Status: DC

## 2015-03-11 MED ORDER — ACETAMINOPHEN 325 MG PO TABS
650.0000 mg | ORAL_TABLET | Freq: Four times a day (QID) | ORAL | Status: DC | PRN
  Administered 2015-03-11 (×2): 650 mg via ORAL
  Filled 2015-03-11 (×2): qty 2

## 2015-03-11 MED ORDER — ENOXAPARIN SODIUM 40 MG/0.4ML ~~LOC~~ SOLN
40.0000 mg | SUBCUTANEOUS | Status: DC
  Filled 2015-03-11: qty 0.4

## 2015-03-11 MED ORDER — HYDROCODONE-ACETAMINOPHEN 5-325 MG PO TABS: 1.0000 | ORAL_TABLET | ORAL | Status: DC | PRN

## 2015-03-11 MED ORDER — ONDANSETRON HCL 4 MG PO TABS: 4.0000 mg | ORAL_TABLET | Freq: Four times a day (QID) | ORAL | Status: DC | PRN

## 2015-03-11 MED ORDER — CARBOXYMETHYLCELLULOSE SODIUM 0.5 % OP SOLN: 2.0000 [drp] | Freq: Three times a day (TID) | OPHTHALMIC | Status: DC

## 2015-03-11 MED ORDER — POLYETHYLENE GLYCOL 3350 17 G PO PACK: 17.0000 g | PACK | Freq: Every day | ORAL | Status: DC | PRN

## 2015-03-11 MED ORDER — ONDANSETRON HCL 4 MG/2ML IJ SOLN: 4.0000 mg | Freq: Four times a day (QID) | INTRAMUSCULAR | Status: DC | PRN

## 2015-03-11 NOTE — H&P (Signed)
[facility] History and Physical  Tmya Wigington WUJ:[id number] DOB: August 19, 1935 DOA: 03/10/2015  Referring physician: ED physician PCP: No PCP Per Patient  Specialists:  None listed  Chief Complaint:  Fever, cough, diarrhea  HPI: Stacey Payne is a 80 y.o. female with PMH of osteoarthritis status post total knee arthroplasty who presents to the ED with 2 days of fever, chills, malaise, rhinorrhea, sinus congestion, cough, and watery diarrhea. She describes herself as generally healthy and was in her usual state until approximately 2 days ago when she woke with clear rhinorrhea and sinus congestion. Over the course of the day, symptoms be came accompanied by fever and chills. Watery diarrhea and then developed and persists. Her symptoms worsened progressively over the ensuing days and by the day of admission, she had become essentially bedbound with generalized weakness. She denies any chest pain or palpitations. She describes her cough is productive of thick white sputum. Diarrhea is watery and there is nausea but no vomiting or abdominal pain. She denies any recent long distance travel but her son with whom she lives is just now recovering from very similar symptoms.  In ED, patient was found toto be febrile to 38.5 C, saturating 90% on room air, tachycardic in the low 100s, tachypneic with a rate of 27, and with blood pressure in the 110/50 range. Radiographs of the chest and abdomen were obtained in feature mild vascular congestion and mild bilateral atelectasis but bowel gas pattern is unremarkable and there are no infiltrates or pulmonary edema identified. CBC is normal and CMP features mild hypokalemia to 3.4. Troponin is undetectable, lactic acid is 0.9, and urinalysis features rare bacteria with negative leukocyte, negative nitrite, and specific gravity > 1.030.   flu swab was obtained and pending. Patient was given a 500 mL normal saline bolus, continued on IV fluids with 100 mL/hr.  Potassium was replaced by mouth. Tylenol was given for fever and Zofran for nausea. Patient persisted in sinus tachycardia and blood pressure remained marginal despite IV fluids.  She will be admitted to the telemetry unit for ongoing evaluation and management of sepsis suspected secondary to acute URI.  Where does patient live?   At home     Can patient participate in ADLs?  Yes       Review of Systems:   General: no sweats or weight change. Fever, chills, loss of appetite, fatigue  HEENT: no blurry vision, hearing changes or sore throat. Sinus congestion, rhinorrhea Pulm: no wheeze. Exertional dyspnea, cough  CV: no chest pain or palpitations Abd: no vomiting, abdominal pain, or constipation. Nausea, watery diarrhea GU: no dysuria, hematuria, increased urinary frequency, or urgency  Ext: no leg edema Neuro: no focal weakness, numbness, or tingling, no vision change or hearing loss Skin: no rash, no wounds MSK: No muscle spasm, no deformity, no red, hot, or swollen joint Heme: No easy bruising or bleeding Travel history: No recent long distant travel    Allergy:  Allergies  Allergen Reactions  . Antihistamines, Chlorpheniramine-Type Anaphylaxis    All antihistamines   . Gentamycin [Gentamicin] Other (See Comments)    Phlebitis   . Lidocaine Swelling    All numbing agents except xylocaine  . Tetracyclines & Related Other (See Comments)    Tongue blisters  . Vancomycin Other (See Comments)    phlebitis  . Iodine Rash  . Metronidazole Swelling and Rash  . Penicillins Rash  . Shellfish Allergy Rash    History reviewed. No pertinent past medical history.  Past  Surgical History  Procedure Laterality Date  . Knee arthroplasty    . Cholecystectomy    . Tonsillectomy      Social History:  reports that she has quit smoking. She has never used smokeless tobacco. She reports that she does not drink alcohol or use illicit drugs.  Family History: History reviewed. No pertinent  family history.   Prior to Admission medications   Medication Sig Start Date End Date Taking? Authorizing Provider  carboxymethylcellulose (REFRESH PLUS) 0.5 % SOLN Place 2 drops into both eyes 3 (three) times daily.   Yes Historical Provider, MD    Physical Exam: Filed Vitals:   03/10/15 2020 03/10/15 2138 03/10/15 2259 03/10/15 2313  BP: 107/49   108/49  Pulse: 95   102  Temp: 99.8 F (37.7 C) 100.1 F (37.8 C) 101.3 F (38.5 C)   TempSrc: Oral Oral Oral   Resp: 17   27  Height:  (1.575 m)     Weight: 57.153 kg (126 lb)     SpO2: 95%   90%   General: Not in acute distress, but obvious discomfort HEENT:       Eyes: PERRL, EOMI, no scleral icterus or conjunctival pallor.       ENT: No discharge from the ears or nose, no pharyngeal ulcers. Tender over maxillary sinuses, Rt > Lt        Neck: No JVD, no bruit, no appreciable mass Heme: No cervical adenopathy, no pallor Cardiac:  Rate ~120 and regular, No murmurs, No gallops or rubs. Pulm: Good air movement bilaterally. Coarse ronchi over right apex. Abd: Soft, nondistended, mild tenderness throughout, no rebound pain or gaurding, no mass or organomegaly, BS present. Ext: No LE edema bilaterally. 2+DP/PT pulse bilaterally. Musculoskeletal: No gross deformity, no red, hot, swollen joints   Skin: No rashes or wounds on exposed surfaces  Neuro: Alert, oriented X3, cranial nerves II-XII grossly intact. No focal findings Psych: Patient is not overtly psychotic, appropriate mood and affect.  Labs on Admission:  Basic Metabolic Panel:  Recent Labs Lab 03/10/15 2130  NA 138  K 3.4*  CL 104  CO2 26  GLUCOSE 97  BUN 15  CREATININE 0.58  CALCIUM 8.5*   Liver Function Tests:  Recent Labs Lab 03/10/15 2130  AST 24  ALT 14  ALKPHOS 90  BILITOT 0.7  PROT 6.3*  ALBUMIN 3.5    Recent Labs Lab 03/10/15 2130  LIPASE 24   No results for input(s): AMMONIA in the last 168 hours. CBC:  Recent Labs Lab  03/10/15 2130  WBC 8.0  NEUTROABS 6.6  HGB 13.6  HCT 42.1  MCV 85.4  PLT 159   Cardiac Enzymes:  Recent Labs Lab 03/10/15 2130  TROPONINI <0.03    BNP (last 3 results) No results for input(s): BNP in the last 8760 hours.  ProBNP (last 3 results) No results for input(s): PROBNP in the last 8760 hours.  CBG: No results for input(s): GLUCAP in the last 168 hours.  Radiological Exams on Admission: Dg Abd Acute W/chest  03/10/2015  CLINICAL DATA:  Acute onset of productive cough and upper abdominal pain. Generalized body aches, nausea, vomiting and diarrhea. Generalized weakness. Initial encounter. EXAM: DG ABDOMEN ACUTE W/ 1V CHEST COMPARISON:  None. FINDINGS: The lungs are well-aerated. Mild vascular congestion is noted. Mild bilateral atelectasis is seen. Scarring is noted at the lung apices. There is no evidence of pleural effusion or pneumothorax. The cardiomediastinal silhouette is borderline normal in size. The  visualized bowel gas pattern is unremarkable. Scattered stool and air are seen within the colon; there is no evidence of small bowel dilatation to suggest obstruction. No free intra-abdominal air is identified on the provided upright view. Clips are noted within the right upper quadrant, reflecting prior cholecystectomy. No acute osseous abnormalities are seen; the sacroiliac joints are unremarkable in appearance. Mild degenerative change is noted along the lumbar spine. IMPRESSION: 1. Unremarkable bowel gas pattern; no free intra-abdominal air seen. Small amount of stool noted in the colon. 2. Mild vascular congestion noted. Mild bilateral atelectasis seen. Scarring at the lung apices. Electronically Signed   By: Roanna Raider M.D.   On: 03/10/2015 22:25    EKG: Independently reviewed.  Abnormal findings:  Sinus tachycardia (rate 101), otherwise normal    Assessment/Plan  1. Sepsis  - Meets criteria at time of admission with fever, tachycardia, tachypnea, and suspected  respiratory source  - Blood and urine cultures are incubating  - Suspect this is secondary to acute viral illness and will watch pt off of abx for now with cultures pending  - If condition worsens or procalcitonin returns elevated, will consider initiating empiric abx  - Trend lactate, check procalcitonin - Continue IVF, monitor on telemetry  - Supportive care with antipyretics, antiemetics  - Droplet precautions  - Empiric Tamiflu, will likely d/c if flu PCR negative    2. Dehydration  - Secondary to diarrhea, acute febrile illness  - Continue IVF resuscitation  - Follow fluid status  - May require diuresis following resolution of acute illness and stabilization of BP and HR as already mild vascular congestion noted on CXR   3. Hypokalemia - Serum potassium 3.4 on presentation, likely secondary to GI losses in setting of diarrhea  - Replaced PO in ED, will continue to supplement in IVF  - Check mag level and replete as needed   4. Acute URI  - Suspect seasonal influenza given current outbreak  - Flu swab sent and pending  - Empiric Tamiflu, will continue if flu assay positive     DVT ppx:  SQ Lovenox     Code Status: Full code Family Communication:  Yes, patient's son at bed side Disposition Plan: Admit to inpatient   Date of Service 03/11/2015    Briscoe Deutscher, MD [facility] Pager [phone]  If 7PM-7AM, please contact night-coverage [email or web address] Password [facility] 03/11/2015, 12:32 AM

## 2015-03-11 NOTE — Progress Notes (Signed)
Patient seen and examined, chart reviewed. Discussed with son and granddaughter at bedside and all questions answered. She presents with myalgias, fever, cough and diarrhea. She has tested positive for influenza B and is on tamiflu. DC 24 hours after last temperature.  Peggye Pitt, MD [facility] Pager: [phone]

## 2015-03-11 NOTE — ED Notes (Signed)
Lab at the bedside 

## 2015-03-11 NOTE — Progress Notes (Signed)
CRITICAL VALUE ALERT  Critical value received:  Flu pcr Flu A positive   Date of notification: 03/11/15  Time of notification:  0155  Critical value read back: yes  Nurse who received alert:  L. Junita Push, RN  MD notified (1st page): Dr. Antionette Char  Time of first page:  0159  MD notified (2nd page):  Time of second page:  Responding MD:  Dr. Antionette Char  Time MD responded:  0200

## 2015-03-11 NOTE — Progress Notes (Signed)
Patient had a oral temp of 101.4 this am, Dr Ardyth Harps notified. Will continue to monitor patient.

## 2015-03-12 LAB — COMPREHENSIVE METABOLIC PANEL
ALT: 15 U/L (ref 14–54)
ANION GAP: 8 (ref 5–15)
AST: 30 U/L (ref 15–41)
Albumin: 2.9 g/dL — ABNORMAL LOW (ref 3.5–5.0)
Alkaline Phosphatase: 76 U/L (ref 38–126)
BILIRUBIN TOTAL: 0.5 mg/dL (ref 0.3–1.2)
BUN: 9 mg/dL (ref 6–20)
CHLORIDE: 107 mmol/L (ref 101–111)
CO2: 22 mmol/L (ref 22–32)
Calcium: 7.9 mg/dL — ABNORMAL LOW (ref 8.9–10.3)
Creatinine, Ser: 0.59 mg/dL (ref 0.44–1.00)
Glucose, Bld: 73 mg/dL (ref 65–99)
POTASSIUM: 3.9 mmol/L (ref 3.5–5.1)
Sodium: 137 mmol/L (ref 135–145)
TOTAL PROTEIN: 5.5 g/dL — AB (ref 6.5–8.1)

## 2015-03-12 LAB — CBC
HEMATOCRIT: 40.6 % (ref 36.0–46.0)
Hemoglobin: 13.1 g/dL (ref 12.0–15.0)
MCH: 27.9 pg (ref 26.0–34.0)
MCHC: 32.3 g/dL (ref 30.0–36.0)
MCV: 86.4 fL (ref 78.0–100.0)
PLATELETS: 130 10*3/uL — AB (ref 150–400)
RBC: 4.7 MIL/uL (ref 3.87–5.11)
RDW: 13.3 % (ref 11.5–15.5)
WBC: 4.5 10*3/uL (ref 4.0–10.5)

## 2015-03-12 LAB — GLUCOSE, CAPILLARY
GLUCOSE-CAPILLARY: 130 mg/dL — AB (ref 65–99)
GLUCOSE-CAPILLARY: 48 mg/dL — AB (ref 65–99)

## 2015-03-12 LAB — URINE CULTURE: Culture: 2000

## 2015-03-12 MED ORDER — OSELTAMIVIR PHOSPHATE 30 MG PO CAPS: 30.0000 mg | ORAL_CAPSULE | Freq: Two times a day (BID) | ORAL | Status: AC

## 2015-03-12 MED ORDER — GLUCOSE 40 % PO GEL
ORAL | Status: AC
  Administered 2015-03-12: 37.5 g
  Filled 2015-03-12: qty 1

## 2015-03-12 NOTE — Progress Notes (Signed)
CRITICAL VALUE ALERT  Critical value received:  CBG 48  Date of notification:  03/12/2015  Time of notification:  0741  Critical value read back:Yes.    Nurse who received alert:  Billie Lade  MD notified (1st page):  Dr Ardyth Harps  Time of first page:  [phone]  MD notified (2nd page):  Time of second page:  Responding MD:  Awaiting response  Time MD responded:  Awaiting response

## 2015-03-12 NOTE — Clinical Documentation Improvement (Signed)
Internal Medicine  Based on the clinical findings below, please document any associated diagnoses/conditions the patient has or may have.    .   Influenza A  Other  Clinically Undetermined  Supporting Information: Component     Latest Ref Rng 03/10/2015  Influenza A By PCR     NEGATIVE POSITIVE (A)  Influenza B By PCR     NEGATIVE NEGATIVE  H1N1 flu by pcr     NOT DETECTED NOT DETECTED     Please exercise your independent, professional judgment when responding. A specific answer is not anticipated or expected. Please update your documentation within the medical record to reflect your response to this query. Thank you  Thank Barrie Dunker [facility] Information [facility] [place] [phone]

## 2015-03-12 NOTE — Discharge Summary (Signed)
Physician Discharge Summary  Stacey Payne ZOX:[id number] DOB: 1935/07/01 DOA: 03/10/2015  PCP: No PCP Per Patient  Admit date: 03/10/2015 Discharge date: 03/12/2015  Time spent: 45 minutes  Recommendations for Outpatient Follow-up:  -Will be discharged home today. -Advised to follow up with PCP in 2 weeks.   Discharge Diagnoses:  Principal Problem:   Influenza A Active Problems:   Dehydration   Acute URI   Sepsis (HCC)   Hypokalemia   Sepsis due to undetermined organism [facility])   Discharge Condition: Stable and improved  Filed Weights   03/10/15 2020 03/11/15 0156 03/12/15 0700  Weight: 57.153 kg (126 lb) 55.7 kg (122 lb 12.7 oz) 55.384 kg (122 lb 1.6 oz)    History of present illness:  As per Dr. Antionette Char on 2/26: Stacey Payne is a 80 y.o. female with PMH of osteoarthritis status post total knee arthroplasty who presents to the ED with 2 days of fever, chills, malaise, rhinorrhea, sinus congestion, cough, and watery diarrhea. She describes herself as generally healthy and was in her usual state until approximately 2 days ago when she woke with clear rhinorrhea and sinus congestion. Over the course of the day, symptoms be came accompanied by fever and chills. Watery diarrhea and then developed and persists. Her symptoms worsened progressively over the ensuing days and by the day of admission, she had become essentially bedbound with generalized weakness. She denies any chest pain or palpitations. She describes her cough is productive of thick white sputum. Diarrhea is watery and there is nausea but no vomiting or abdominal pain. She denies any recent long distance travel but her son with whom she lives is just now recovering from very similar symptoms.  In ED, patient was found toto be febrile to 38.5 C, saturating 90% on room air, tachycardic in the low 100s, tachypneic with a rate of 27, and with blood pressure in the 110/50 range. Radiographs of the chest and abdomen were  obtained in feature mild vascular congestion and mild bilateral atelectasis but bowel gas pattern is unremarkable and there are no infiltrates or pulmonary edema identified. CBC is normal and CMP features mild hypokalemia to 3.4. Troponin is undetectable, lactic acid is 0.9, and urinalysis features rare bacteria with negative leukocyte, negative nitrite, and specific gravity > 1.030. flu swab was obtained and pending. Patient was given a 500 mL normal saline bolus, continued on IV fluids with 100 mL/hr. Potassium was replaced by mouth. Tylenol was given for fever and Zofran for nausea. Patient persisted in sinus tachycardia and blood pressure remained marginal despite IV fluids. She will be admitted to the telemetry unit for ongoing evaluation and management of sepsis suspected secondary to acute URI.  Hospital Course:   Influenza A ->24 hours since last fever. -Plan to DC home today. -Has 4 days of tamiflu remaining.  Procedures:  None   Consultations:  None  Discharge Instructions  Discharge Instructions    Increase activity slowly    Complete by:  As directed             Medication List    TAKE these medications        carboxymethylcellulose 0.5 % Soln  Commonly known as:  REFRESH PLUS  Place 2 drops into both eyes 3 (three) times daily.     oseltamivir 30 MG capsule  Commonly known as:  TAMIFLU  Take 1 capsule (30 mg total) by mouth 2 (two) times daily.       Allergies  Allergen Reactions  .  Antihistamines, Chlorpheniramine-Type Anaphylaxis    All antihistamines   . Gentamycin [Gentamicin] Other (See Comments)    Phlebitis   . Lidocaine Swelling    All numbing agents except xylocaine  . Tetracyclines & Related Other (See Comments)    Tongue blisters  . Vancomycin Other (See Comments)    phlebitis  . Iodine Rash  . Metronidazole Swelling and Rash  . Penicillins Rash  . Shellfish Allergy Rash       Follow-up Information    Schedule an appointment as  soon as possible for a visit in 2 weeks to follow up.   Why:  with your regular physician       The results of significant diagnostics from this hospitalization (including imaging, microbiology, ancillary and laboratory) are listed below for reference.    Significant Diagnostic Studies: Dg Abd Acute W/chest  03/10/2015  CLINICAL DATA:  Acute onset of productive cough and upper abdominal pain. Generalized body aches, nausea, vomiting and diarrhea. Generalized weakness. Initial encounter. EXAM: DG ABDOMEN ACUTE W/ 1V CHEST COMPARISON:  None. FINDINGS: The lungs are well-aerated. Mild vascular congestion is noted. Mild bilateral atelectasis is seen. Scarring is noted at the lung apices. There is no evidence of pleural effusion or pneumothorax. The cardiomediastinal silhouette is borderline normal in size. The visualized bowel gas pattern is unremarkable. Scattered stool and air are seen within the colon; there is no evidence of small bowel dilatation to suggest obstruction. No free intra-abdominal air is identified on the provided upright view. Clips are noted within the right upper quadrant, reflecting prior cholecystectomy. No acute osseous abnormalities are seen; the sacroiliac joints are unremarkable in appearance. Mild degenerative change is noted along the lumbar spine. IMPRESSION: 1. Unremarkable bowel gas pattern; no free intra-abdominal air seen. Small amount of stool noted in the colon. 2. Mild vascular congestion noted. Mild bilateral atelectasis seen. Scarring at the lung apices. Electronically Signed   By: Roanna Raider M.D.   On: 03/10/2015 22:25    Microbiology: Recent Results (from the past 240 hour(s))  Urine culture     Status: None   Collection Time: 03/10/15 10:54 PM  Result Value Ref Range Status   Specimen Description URINE, CLEAN CATCH  Final   Special Requests NONE  Final   Culture   Final    2,000 COLONIES/mL INSIGNIFICANT GROWTH Performed at [facility]     Report Status 03/12/2015 FINAL  Final  Culture, blood (x 2)     Status: None (Preliminary result)   Collection Time: 03/11/15 12:44 AM  Result Value Ref Range Status   Specimen Description BLOOD RIGHT ARM  Final   Special Requests BOTTLES DRAWN AEROBIC AND ANAEROBIC 6CC  Final   Culture NO GROWTH 1 DAY  Final   Report Status PENDING  Incomplete  Culture, blood (x 2)     Status: None (Preliminary result)   Collection Time: 03/11/15 12:50 AM  Result Value Ref Range Status   Specimen Description LEFT ANTECUBITAL  Final   Special Requests BOTTLES DRAWN AEROBIC ONLY 7CC  Final   Culture NO GROWTH 1 DAY  Final   Report Status PENDING  Incomplete     Labs: Basic Metabolic Panel:  Recent Labs Lab 03/10/15 2130 03/11/15 0040 03/12/15 0115  NA 138  --  137  K 3.4*  --  3.9  CL 104  --  107  CO2 26  --  22  GLUCOSE 97  --  73  BUN 15  --  9  CREATININE 0.58  --  0.59  CALCIUM 8.5*  --  7.9*  MG  --  1.8  --    Liver Function Tests:  Recent Labs Lab 03/10/15 2130 03/12/15 0115  AST 24 30  ALT 14 15  ALKPHOS 90 76  BILITOT 0.7 0.5  PROT 6.3* 5.5*  ALBUMIN 3.5 2.9*    Recent Labs Lab 03/10/15 2130  LIPASE 24   No results for input(s): AMMONIA in the last 168 hours. CBC:  Recent Labs Lab 03/10/15 2130 03/12/15 0115  WBC 8.0 4.5  NEUTROABS 6.6  --   HGB 13.6 13.1  HCT 42.1 40.6  MCV 85.4 86.4  PLT 159 130*   Cardiac Enzymes:  Recent Labs Lab 03/10/15 2130  TROPONINI <0.03   BNP: BNP (last 3 results)  Recent Labs  03/11/15 0040  BNP 40.0    ProBNP (last 3 results) No results for input(s): PROBNP in the last 8760 hours.  CBG:  Recent Labs Lab 03/11/15 0746 03/12/15 0736 03/12/15 0816  GLUCAP 75 48* 130*       Signed:  HERNANDEZ ACOSTA,ESTELA  [facility] Pager: [phone] 03/12/2015, 4:16 PM

## 2015-03-16 LAB — CULTURE, BLOOD (ROUTINE X 2)
CULTURE: NO GROWTH
Culture: NO GROWTH

## 2015-12-26 ENCOUNTER — Other Ambulatory Visit (HOSPITAL_COMMUNITY): Payer: Self-pay

## 2015-12-26 DIAGNOSIS — E041 Nontoxic single thyroid nodule: Secondary | ICD-10-CM

## 2016-01-29 ENCOUNTER — Other Ambulatory Visit (HOSPITAL_COMMUNITY): Payer: Self-pay | Admitting: Radiology

## 2016-04-01 ENCOUNTER — Ambulatory Visit (INDEPENDENT_AMBULATORY_CARE_PROVIDER_SITE_OTHER): Payer: Medicare Other

## 2016-04-01 ENCOUNTER — Encounter (INDEPENDENT_AMBULATORY_CARE_PROVIDER_SITE_OTHER): Payer: Self-pay

## 2016-04-01 DIAGNOSIS — E785 Hyperlipidemia, unspecified: Secondary | ICD-10-CM | POA: Insufficient documentation

## 2016-04-01 DIAGNOSIS — M4800 Spinal stenosis, site unspecified: Secondary | ICD-10-CM | POA: Insufficient documentation

## 2016-04-01 DIAGNOSIS — E041 Nontoxic single thyroid nodule: Principal | ICD-10-CM

## 2016-04-01 NOTE — Progress Notes (Signed)
[facility]    Sheri Garza is a 81 y.o. y.o. female who presents to Endocrine clinic today with concerns regarding thyroid nodules.        [facility]  Right lobe : RMP 0.7 CM and 1.1x0.5 cm nodule  Left lobe : 2.7x 1.1 cm nodule    No history of head or neck irradiation . No family history of Thyroid cancer    She has mild dysphagia and hoarseness which comes off and on but not persistent    Last TSH : 0.9    She had FNAB of the left side nodule which was benign in 2015  She had FNA BT of the right midpole thyroid nodule which was benign in January of 2017    She had a follow up US in NOV 2016 which was stable from 2015    Another thyroid ultrasound in 2017 from [facility] shows a thyroid nodules are overall stable  This  ultrasound report will be scanned in the electronic medical record      Social History   Substance Use Topics   . Smoking status: Never Smoker   . Smokeless tobacco: Never Used   . Alcohol use No       Past Medical History:   Diagnosis Date   . Dry eye    . Hyperlipidemia    . Spinal stenosis    . Thyroid nodule            Past Surgical History:   Procedure Laterality Date   . HX CHOLECYSTECTOMY     . HX DILATION AND CURETTAGE     . HX OTHER      trigger finger, repair quad             Outpatient Medications Prior to Visit:  artificial tear, gonioscop-HPM, (GONIOSOL) 2.5 % Ophthalmic Drops Instill 1 Drop into both eyes Once a day   multivitamin Oral Tablet Take 1 Tab by mouth Once a day     No facility-administered medications prior to visit.     Allergies   Allergen Reactions   . Flagyl [Metronidazole]    . Gentamicin    . Iodine And Iodide Containing Products    . Other      Antihistamines and all caines   . Penicillins    . Tetracycline    . Vancomycin          REVIEW OF SYSTEMS:    GENERAL:  Loss of appetite: no  Weight Loss:no  Weight Gain:no  Fatigue:yes GENITOURINARY:  Frequent Urination:no  Change Sex Drive:no      SKIN:  Dry Skin:no  Hair Loss:no  Hair Growth:no  Nail Changes:no MUSCULOSKELETAL:  Joint Pain:yes  Muscle Pain:no  Muscle Cramps:no     EYES:  Blurred Vision:no  Cataract: no  Decreased Vision: no  Double Vision: no  Glaucoma: no NEUROLOGY:  Headaches:no  Numbness-location:    Burning-location:   Tingling-location:    NECK:  Pain:no  Swelling:no  Swollen Gland:no PSYCHIATRIC:  Anxiety:no  Depression:no}  Insomnia:no  Mood Changes:no  Memory Loss:no   HEART:  Irregular Heart Rate:no  Rapid Heart Rate:no ENDOCRINOLOGY:  Cold Intolerance:no  Elevated Blood Sugar:no  Heat Intolerance:no  Libido Change:no  Low Blood Sugar:no   GASTROINTESTINAL:  Nausea: no  Constipation:no  Diarrhea:no ANY OTHER SYMPTOMS:         Physical Exam    Thyroid:  enlarged  Vitals:  Blood pressure  120/62, pulse 90, height 1.575 m (5\' 2" ), weight 54.9 kg (121 lb), SpO2 96 %.   Body mass index is 22.13 kg/(m^2).  General:  pleasant, well-appearing, no acute distress, conversant  Psych:  appropriate affect, A&Ox3  Skin:  warm, dry, no rashes, no abnormal bruising, no abnormal striae  Eyes:  EOMI, PER, non-injected conjunctiva  HENT:  atraumatic, oral mucosa moist and pink, nl pinna  Neck:  trachea midline, neck supple    LNs:  no cervical, submandibular, supraclavicular adenopathy, no LNs notes in extremities  Heart: RRR, 2+ peripheral pulses, no peripheral edema  Lungs: CTAB, nl respiratory effort, no wheezes, rales, or rhonchi  Abdomen: soft, non-tender, normoactive bowel sounds  Musculoskeletal: nl-looking joints, nl muscular strength, nl gait  Neurological: nl patellar reflexes, no tremor, nl sensation        Assessment and Plan:    1. THYROID NODULE  - [facility]    - the right mid pole thyroid nodule was benign in January of 2017.  The lower left lobe thyroid nodule was benign in November of 2015.    - ultrasound from 2015-2018 have been stable    - Discussed the prevalence of thyroid nodules in the  general population and the low risk of cancer . Reviewed with her the American Thyroid Association's recommendations regarding nodules.    -Given the size and features of her nodules and the fact that they have been stable with 3 years, I have recommended that we simply follow it for now with palpation and serial ultrasound..     - Discussed reporting compressive symptoms like choking , cough , shortness of breath as well increase in lump around the neck etc    She will repeat U/S in 2 years by her primary care physician.

## 2016-04-01 NOTE — Nursing Note (Signed)
Patient is referred here for management of Thyroid nodule  She is here for follow up    [facility]  Right lobe : RMP 0.7 CM and 1.1x0.5 cm nodule  Left lobe : 2.7x 1.1 cm nodule  Isthmus    No history of head or neck irradiation . No family history of Thyroid cancer    She has mild dysphagia and hoarseness which comes off and on but not persistent    Last TSH : 0.9    She had FNAB of the left side nodule which was benign    She had a follow up US in NOV 2016 which shows the left sided nodule is stable in size  There are other subcentimeter nodules stable in size  The right side nodule 1.1 cm is now 1.4 cm     REVIEW OF SYSTEMS:    GENERAL:  Loss of appetite: no  Weight Loss:no  Weight Gain:no  Fatigue:yes GENITOURINARY:  Frequent Urination:no  Change Sex Drive:no     SKIN:  Dry Skin:no  Hair Loss:no  Hair Growth:no  Nail Changes:no MUSCULOSKELETAL:  Joint Pain:yes  Muscle Pain:no  Muscle Cramps:no     EYES:  Blurred Vision:no  Cataract: no  Decreased Vision: no  Double Vision: no  Glaucoma: no NEUROLOGY:  Headaches:no  Numbness-location:    Burning-location:   Tingling-location:    NECK:  Pain:no  Swelling:no  Swollen Gland:no PSYCHIATRIC:  Anxiety:no  Depression:no}  Insomnia:no  Mood Changes:no  Memory Loss:no   HEART:  Irregular Heart Rate:no  Rapid Heart Rate:no ENDOCRINOLOGY:  Cold Intolerance:no  Elevated Blood Sugar:no  Heat Intolerance:no  Libido Change:no  Low Blood Sugar:no   GASTROINTESTINAL:  Nausea: no  Constipation:no  Diarrhea:no ANY OTHER SYMPTOMS:

## 2016-08-25 ENCOUNTER — Encounter (INDEPENDENT_AMBULATORY_CARE_PROVIDER_SITE_OTHER): Payer: Self-pay | Admitting: Foot Surgery

## 2016-08-25 ENCOUNTER — Ambulatory Visit (INDEPENDENT_AMBULATORY_CARE_PROVIDER_SITE_OTHER): Payer: Medicare Other | Admitting: Foot Surgery

## 2016-08-25 VITALS — Resp 16 | Ht 62.0 in | Wt 121.0 lb

## 2016-08-25 DIAGNOSIS — M79672 Pain in left foot: Secondary | ICD-10-CM

## 2016-08-25 DIAGNOSIS — M79671 Pain in right foot: Secondary | ICD-10-CM

## 2016-08-25 DIAGNOSIS — L84 Corns and callosities: Secondary | ICD-10-CM

## 2016-08-25 NOTE — Progress Notes (Signed)
[place]   Department of [facility]  Podiatry    Sheri Garza is a 81 y.o. female    Chief Complaint   Patient presents with   . Callous           Past Medical History:     Past Medical History:   Diagnosis Date   . Dry eye    . Hyperlipidemia    . Spinal stenosis    . Thyroid nodule          Past Surgical History:   Procedure Laterality Date   . HX CHOLECYSTECTOMY     . HX DILATION AND CURETTAGE     . HX OTHER      trigger finger, repair quad           Family History:  Family Medical History     Problem Relation (Age of Onset)    Cancer Other, Father, Brother    Diabetes Other, Mother    HTN <20 y.o. Other    Heart Disease Mother              Social History:     Social History   Substance Use Topics   . Smoking status: Never Smoker   . Smokeless tobacco: Never Used   . Alcohol use No       Medications:      Outpatient Medications Prior to Visit:  artificial tear, gonioscop-HPM, (GONIOSOL) 2.5 % Ophthalmic Drops Instill 1 Drop into both eyes Once a day     No facility-administered medications prior to visit.     Allergies:     Allergies   Allergen Reactions   . Flagyl [Metronidazole]    . Gentamicin    . Iodine And Iodide Containing Products    . Other      Antihistamines and all caines   . Penicillins    . Tetracycline    . Vancomycin        Patient Active Problem List   Diagnosis   . Right Knee OA   . Thyroid nodule   . Spinal stenosis   . Hyperlipidemia     Resp 16  Ht 1.575 m (_0 )  Wt 54.9 kg (121 lb)  BMI 22.13 kg/m2    DPM Addition to HPI: Sheri Garza is a 81 y.o. female presents today as a patient who is known to me and I have within the past year.  She presents today with complaint of calluses on both of her feet.  She states her extremely sore she points to the ball of the right foot and the heel and the ball of the left foot. She states she does get a burning type sensation in her feet.  And they are in the area of the callus.    All other ROS Negative    Physical  Exam:    Vascular exam       Dorsalis pedis pulse Right  1/4 Left   1/4   Posterior tibial pulse 1+ 1+   Capilary fill time  Less than 3 seconds Less than 3 seconds   Skin temperature cool cool   Edema 0 0   Comments:       Neurological exam      Right Left    Gross sensation intact to touch? yes yes   Paresthesia noted?  no no   Vibratory sensation? intact intact   Comments:       Dermatologic exam  Right Left    Open wounds or lesions? yes and comments Hyperkeratosis is noted plantar aspect the 2nd met head and the plantar right heel. yes and comments Area hyperkeratosis is noted plantar aspect of the 1st metatarsal head.   Ecchymosis 0 0   Erythema 0 0   Edema 0 0   Hyperpigmentation yes yes   Nail appearance Normal in appearance 1-5 Normal in appearance 1-5   Comments:       Musculoskeletal exam      Right Left    Dorsiflexion 5+ 5+   Plantar flexion 5+ 5+   Inversion 5+ 5+   Eversion 5+ 5+   Intrinsic muscle strength     Range of Motion Pain-free range of motion AJ STJ MPJs Pain-free range of motion AJ STJ MPJs   Crepitus no no   Foot type planus planus   Comments:             Assessment:      ICD-10-CM    1. Callus L84    2. Right foot pain M79.671        Plan of Care:    Discussed etiology and treatment options of callus deformities with patient. Did remove areas hyperkeratosis and advised patient on use of different types of skin softeners and paddings.    Follow up:    I have asked Sheri Garza to follow up in 3 months or PRN if symptoms worsen or fail to improve.   Sheri Garza, DPM 08/25/2016, 15:43

## 2016-10-27 ENCOUNTER — Encounter (INDEPENDENT_AMBULATORY_CARE_PROVIDER_SITE_OTHER): Payer: Self-pay | Admitting: Foot Surgery

## 2016-11-24 ENCOUNTER — Encounter (INDEPENDENT_AMBULATORY_CARE_PROVIDER_SITE_OTHER): Payer: Self-pay | Admitting: Foot Surgery

## 2016-11-24 ENCOUNTER — Ambulatory Visit (INDEPENDENT_AMBULATORY_CARE_PROVIDER_SITE_OTHER): Payer: Medicare Other | Admitting: Foot Surgery

## 2016-11-24 VITALS — Ht 62.0 in | Wt 118.0 lb

## 2016-11-24 DIAGNOSIS — M79672 Pain in left foot: Secondary | ICD-10-CM

## 2016-11-24 DIAGNOSIS — M79671 Pain in right foot: Secondary | ICD-10-CM

## 2016-11-24 DIAGNOSIS — L84 Corns and callosities: Secondary | ICD-10-CM

## 2016-11-24 DIAGNOSIS — R600 Localized edema: Secondary | ICD-10-CM

## 2016-11-24 NOTE — Progress Notes (Signed)
[place]   Department of [facility]  Podiatry    Sheri Garza is a 81 y.o. female    Chief Complaint   Patient presents with   . Foot Pain     RFC           Past Medical History:     Past Medical History:   Diagnosis Date   . Dry eye    . Hyperlipidemia    . Spinal stenosis    . Thyroid nodule          Past Surgical History:   Procedure Laterality Date   . HX CHOLECYSTECTOMY     . HX DILATION AND CURETTAGE     . HX OTHER      trigger finger, repair quad           Family History:  Family Medical History:     Problem Relation (Age of Onset)    Cancer Other, Father, Brother    Diabetes Other, Mother    HTN <20 y.o. Other    Heart Disease Mother              Social History:     Social History   Substance Use Topics   . Smoking status: Never Smoker   . Smokeless tobacco: Never Used   . Alcohol use No       Medications:      Outpatient Medications Prior to Visit:  artificial tear, gonioscop-HPM, (GONIOSOL) 2.5 % Ophthalmic Drops Instill 2 Drops into both eyes Three times a day      No facility-administered medications prior to visit.     Allergies:     Allergies   Allergen Reactions   . Flagyl [Metronidazole]    . Gentamicin    . Iodine And Iodide Containing Products    . Other      Antihistamines and all caines, Pt states Xylocaine is ok   . Penicillins    . Tetracycline    . Vancomycin        Patient Active Problem List   Diagnosis   . Right Knee OA   . Thyroid nodule   . Spinal stenosis   . Hyperlipidemia     Ht 1.575 m ('5\' 2"'$ )  Wt 53.5 kg (118 lb)  BMI 21.58 kg/m2    DPM Addition to HPI: Sheri Garza is a 81 y.o. female presents today as a patient who is known to me and I have within the past year.  She presents today with complaint of calluses on both of her feet.  She states her extremely sore she points to the ball of the right foot and the heel and the ball of the left foot. She states she does get a burning type sensation in her feet.   All other ROS Negative    Physical Exam:     Vascular exam       Dorsalis pedis pulse Right  1/4 Left   1/4   Posterior tibial pulse 1+ 1+   Capilary fill time  Less than 3 seconds Less than 3 seconds   Skin temperature cool cool   Edema 0 0   Comments:       Neurological exam      Right Left    Gross sensation intact to touch? yes yes   Paresthesia noted?  no no   Vibratory sensation? intact intact   Comments:       Dermatologic exam  Right Left    Open wounds or lesions? yes and comments Hyperkeratosis is noted plantar aspect the 2nd met head and the plantar right heel. yes and comments Area hyperkeratosis is noted plantar aspect of the 1st metatarsal head.   Ecchymosis 0 0   Erythema 0 0   Edema 0 0   Hyperpigmentation yes yes   Nail appearance Normal in appearance 1-5 Normal in appearance 1-5   Comments:       Musculoskeletal exam      Right Left    Dorsiflexion 5+ 5+   Plantar flexion 5+ 5+   Inversion 5+ 5+   Eversion 5+ 5+   Intrinsic muscle strength     Range of Motion Pain-free range of motion AJ STJ MPJs Pain-free range of motion AJ STJ MPJs   Crepitus no no   Foot type planus planus   Comments:             Assessment:      ICD-10-CM    1. Callus L84    2. Right foot pain M79.671    3. Left foot pain M79.672    4. Bilateral edema of lower extremity R60.0        Plan of Care:    Discussed etiology and treatment options of callus deformities with patient. Did remove areas hyperkeratosis and advised patient on use of different types of skin softeners and paddings.    Follow up:    I have asked Sheri Garza to follow up in 3 months or PRN if symptoms worsen or fail to improve.   Clarise Cruz, DPM 11/24/2016, 14:03

## 2016-12-22 DIAGNOSIS — I340 Nonrheumatic mitral (valve) insufficiency: Secondary | ICD-10-CM

## 2016-12-22 DIAGNOSIS — R600 Localized edema: Secondary | ICD-10-CM

## 2017-02-25 ENCOUNTER — Encounter (INDEPENDENT_AMBULATORY_CARE_PROVIDER_SITE_OTHER): Payer: Medicare Other | Admitting: Foot Surgery

## 2017-03-11 ENCOUNTER — Ambulatory Visit (INDEPENDENT_AMBULATORY_CARE_PROVIDER_SITE_OTHER): Payer: Medicare Other | Admitting: Foot Surgery

## 2017-03-11 ENCOUNTER — Encounter (INDEPENDENT_AMBULATORY_CARE_PROVIDER_SITE_OTHER): Payer: Self-pay | Admitting: Foot Surgery

## 2017-03-11 VITALS — Ht 62.0 in | Wt 118.0 lb

## 2017-03-11 DIAGNOSIS — I872 Venous insufficiency (chronic) (peripheral): Secondary | ICD-10-CM

## 2017-03-11 DIAGNOSIS — R600 Localized edema: Secondary | ICD-10-CM

## 2017-03-11 DIAGNOSIS — L84 Corns and callosities: Secondary | ICD-10-CM

## 2017-03-11 DIAGNOSIS — M79671 Pain in right foot: Principal | ICD-10-CM

## 2017-03-11 DIAGNOSIS — M79672 Pain in left foot: Secondary | ICD-10-CM

## 2017-03-11 NOTE — Progress Notes (Signed)
[place]   Department of [facility]  Podiatry    Sheri Garza is a 82 y.o. female    Chief Complaint   Patient presents with   . Foot Pain     RFC           Past Medical History:     Past Medical History:   Diagnosis Date   . Dry eye    . Hyperlipidemia    . Spinal stenosis    . Thyroid nodule          Past Surgical History:   Procedure Laterality Date   . HX CHOLECYSTECTOMY     . HX DILATION AND CURETTAGE     . HX OTHER      trigger finger, repair quad           Family History:  Family Medical History:     Problem Relation (Age of Onset)    Cancer Other, Father, Brother    Diabetes Other, Mother    HTN <20 y.o. Other    Heart Disease Mother              Social History:     Social History     Tobacco Use   . Smoking status: Never Smoker   . Smokeless tobacco: Never Used   Substance Use Topics   . Alcohol use: No       Medications:      Outpatient Medications Prior to Visit:  artificial tear, gonioscop-HPM, (GONIOSOL) 2.5 % Ophthalmic Drops Instill 2 Drops into both eyes Three times a day      No facility-administered medications prior to visit.     Allergies:     Allergies   Allergen Reactions   . Flagyl [Metronidazole]    . Gentamicin    . Iodine And Iodide Containing Products    . Other      Antihistamines and all caines, Pt states Xylocaine is ok   . Penicillins    . Tetracycline    . Vancomycin        Patient Active Problem List   Diagnosis   . Right Knee OA   . Thyroid nodule   . Spinal stenosis   . Hyperlipidemia     Ht 1.575 m ('5\' 2"'$ )  Wt 53.5 kg (118 lb)  BMI 21.58 kg/m2    DPM Addition to HPI: Sheri Garza is a 82 y.o. female presents today as a patient who is known to me and I have within the past year.  She presents today with complaint of calluses on both of her feet.  She states her extremely sore she points to the ball of the right foot and the heel and the ball of the left foot. She states she does get a burning type sensation in her feet.   All other ROS  Negative    Physical Exam:    Vascular exam       Dorsalis pedis pulse Right  1/4 Left   1/4   Posterior tibial pulse 1+ 1+   Capilary fill time  Less than 3 seconds Less than 3 seconds   Skin temperature cool cool   Edema 0 0   Comments:       Neurological exam      Right Left    Gross sensation intact to touch? yes yes   Paresthesia noted?  no no   Vibratory sensation? intact intact   Comments:  Dermatologic exam      Right Left    Open wounds or lesions? yes and comments Hyperkeratosis is noted plantar aspect the 2nd met head and the plantar right heel. yes and comments Area hyperkeratosis is noted plantar aspect of the 1st metatarsal head.   Ecchymosis 0 0   Erythema 0 0   Edema 0 0   Hyperpigmentation yes yes   Nail appearance Normal in appearance 1-5 Normal in appearance 1-5   Comments:       Musculoskeletal exam      Right Left    Dorsiflexion 5+ 5+   Plantar flexion 5+ 5+   Inversion 5+ 5+   Eversion 5+ 5+   Intrinsic muscle strength     Range of Motion Pain-free range of motion AJ STJ MPJs Pain-free range of motion AJ STJ MPJs   Crepitus no no   Foot type planus planus   Comments:             Assessment:      ICD-10-CM    1. Right foot pain M79.671    2. Left foot pain M79.672    3. Bilateral edema of lower extremity R60.0    4. Callus L84    5. Venous insufficiency of both lower extremities I87.2        Plan of Care:    Did discuss venous insufficiency with the patient today.  Do recommend compressive hose.  Did advise even if she got over-the-counter maybe 10-20 mm of mercury pressure.  This would be better.  Follow up:    I have asked Jadia Capers to follow up in 3 months or PRN if symptoms worsen or fail to improve.   Clarise Cruz, DPM 03/11/2017, 09:37

## 2017-04-04 IMAGING — DX DG ABDOMEN ACUTE W/ 1V CHEST
4 series · 4 of 4 positions shown · non-contrast
Comparison: None.

CLINICAL DATA: Acute onset of productive cough and upper abdominal
pain. Generalized body aches, nausea, vomiting and diarrhea.
Generalized weakness. Initial encounter.

EXAM:
DG ABDOMEN ACUTE W/ 1V CHEST

[chest pa]
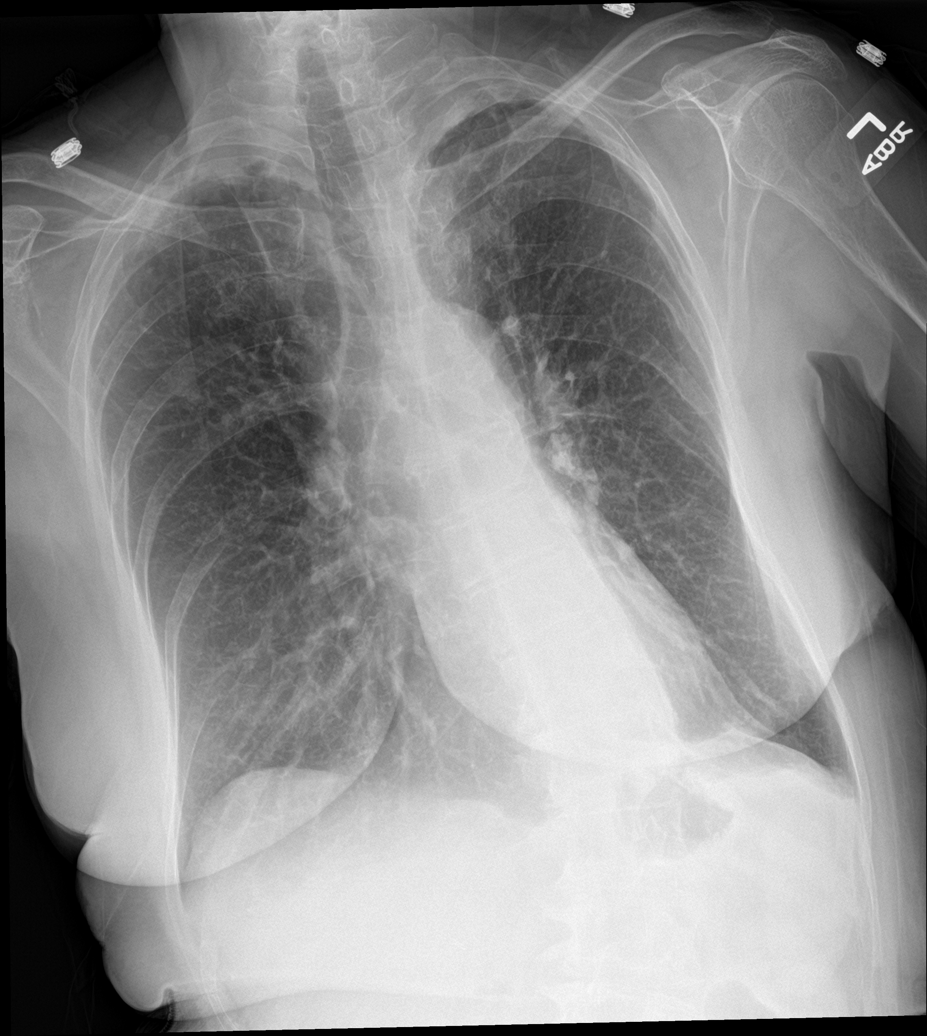

[abdomen erect]
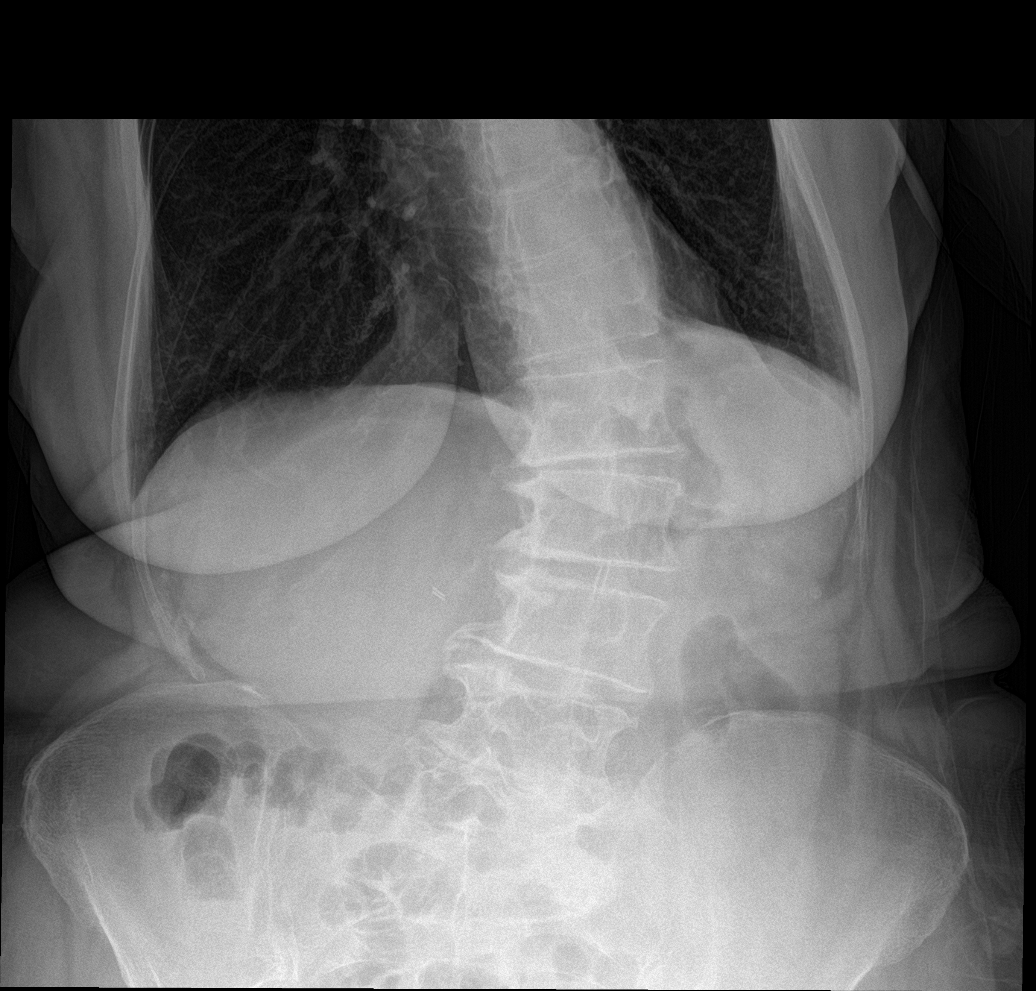

[abdomen supine (1 of 2)]
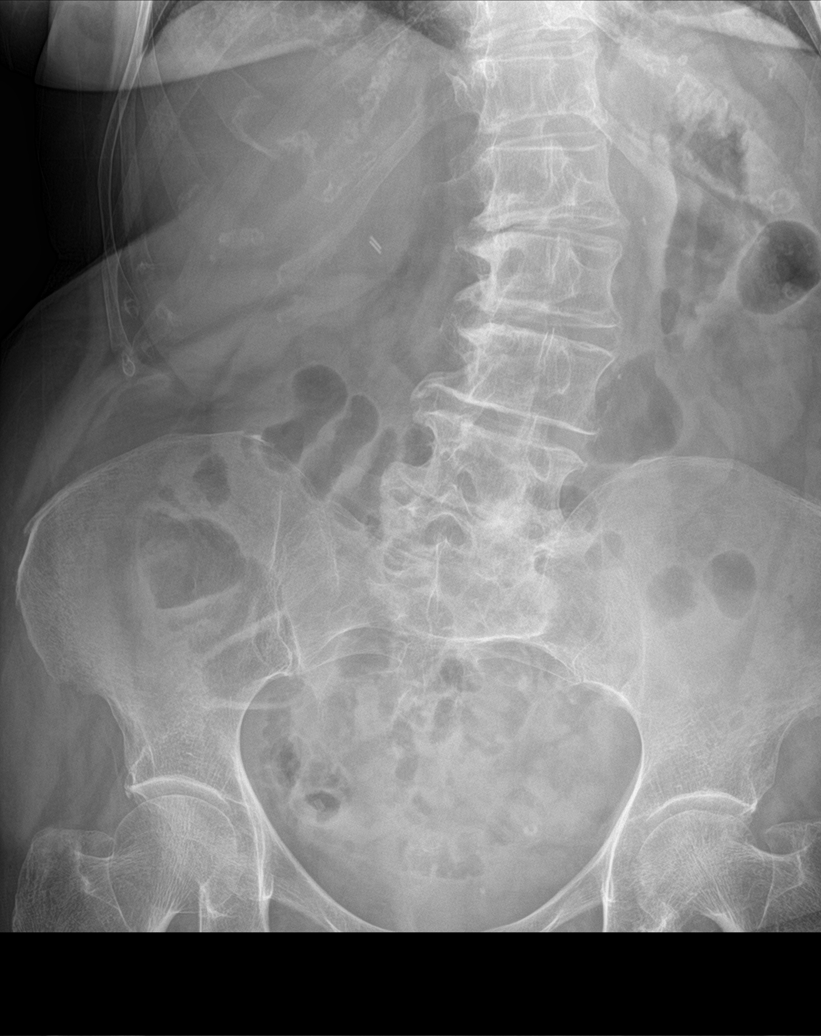

[abdomen supine (2 of 2)]
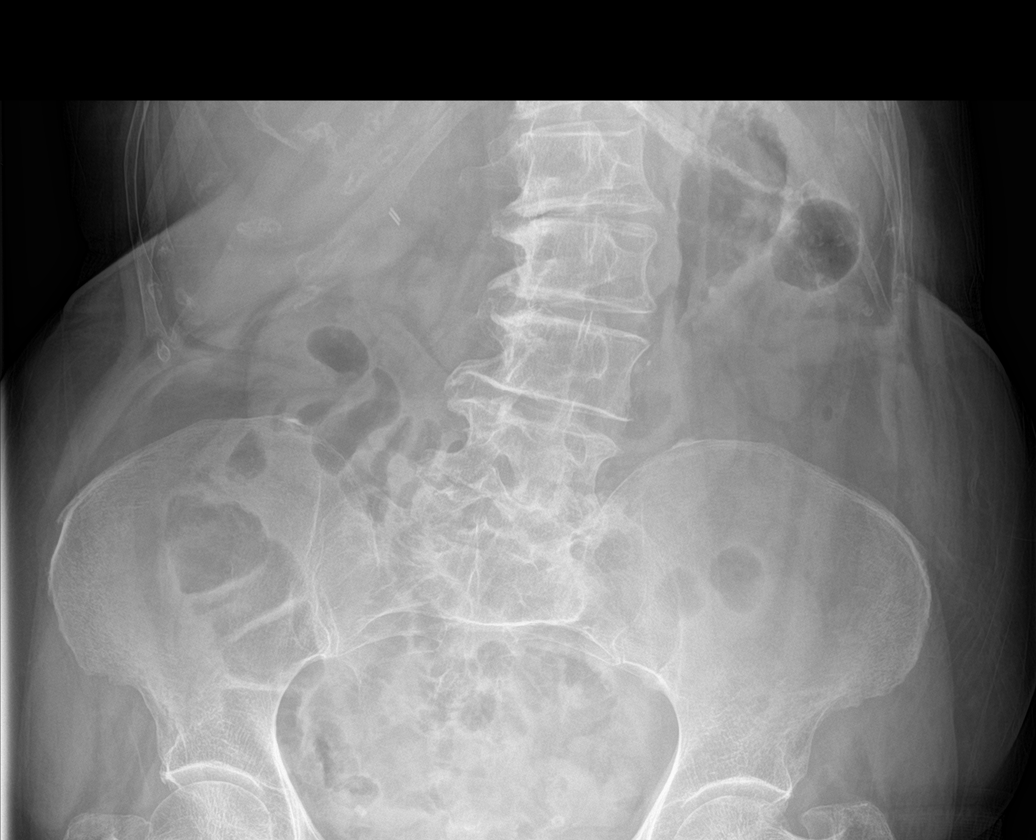

[4 of 4 positions shown; findings below may reference images not displayed]

FINDINGS: The lungs are well-aerated. Mild vascular congestion is noted. Mild
bilateral atelectasis is seen. Scarring is noted at the lung apices.
There is no evidence of pleural effusion or pneumothorax. The
cardiomediastinal silhouette is borderline normal in size.

The visualized bowel gas pattern is unremarkable. Scattered stool
and air are seen within the colon; there is no evidence of small
bowel dilatation to suggest obstruction. No free intra-abdominal air
is identified on the provided upright view. Clips are noted within
the right upper quadrant, reflecting prior cholecystectomy.

No acute osseous abnormalities are seen; the sacroiliac joints are
unremarkable in appearance. Mild degenerative change is noted along
the lumbar spine.
IMPRESSION: 1. Unremarkable bowel gas pattern; no free intra-abdominal air seen.
Small amount of stool noted in the colon.
2. Mild vascular congestion noted. Mild bilateral atelectasis seen.
Scarring at the lung apices.

## 2017-06-10 ENCOUNTER — Encounter (INDEPENDENT_AMBULATORY_CARE_PROVIDER_SITE_OTHER): Payer: Self-pay | Admitting: Foot Surgery

## 2017-06-10 ENCOUNTER — Ambulatory Visit (INDEPENDENT_AMBULATORY_CARE_PROVIDER_SITE_OTHER): Payer: Medicare Other | Admitting: Foot Surgery

## 2017-06-10 VITALS — Resp 17 | Ht 62.0 in | Wt 118.0 lb

## 2017-06-10 DIAGNOSIS — L84 Corns and callosities: Secondary | ICD-10-CM

## 2017-06-10 DIAGNOSIS — R600 Localized edema: Secondary | ICD-10-CM

## 2017-06-10 DIAGNOSIS — M79672 Pain in left foot: Secondary | ICD-10-CM

## 2017-06-10 DIAGNOSIS — I872 Venous insufficiency (chronic) (peripheral): Principal | ICD-10-CM

## 2017-06-10 NOTE — Progress Notes (Signed)
[place]   Department of [facility]  Podiatry    Sheri Garza is a 82 y.o. female    Chief Complaint   Patient presents with   . Callous           Past Medical History:     Past Medical History:   Diagnosis Date   . Dry eye    . Hyperlipidemia    . Spinal stenosis    . Thyroid nodule          Past Surgical History:   Procedure Laterality Date   . HX CHOLECYSTECTOMY     . HX DILATION AND CURETTAGE     . HX OTHER      trigger finger, repair quad           Family History:  Family Medical History:     Problem Relation (Age of Onset)    Cancer Other, Father, Brother    Diabetes Other, Mother    HTN <20 y.o. Other    Heart Disease Mother              Social History:     Social History     Tobacco Use   . Smoking status: Never Smoker   . Smokeless tobacco: Never Used   Substance Use Topics   . Alcohol use: No       Medications:      Outpatient Medications Prior to Visit:  artificial tear, gonioscop-HPM, (GONIOSOL) 2.5 % Ophthalmic Drops Instill 2 Drops into both eyes Three times a day      No facility-administered medications prior to visit.     Allergies:     Allergies   Allergen Reactions   . Flagyl [Metronidazole]    . Gentamicin    . Iodine And Iodide Containing Products    . Other      Antihistamines and all caines, Pt states Xylocaine is ok   . Penicillins    . Tetracycline    . Vancomycin        Patient Active Problem List   Diagnosis   . Right Knee OA   . Thyroid nodule   . Spinal stenosis   . Hyperlipidemia     Ht 1.575 m (_0 )  Wt 53.5 kg (118 lb)  BMI 21.58 kg/m2    DPM Addition to HPI: Sheri Garza is a 82 y.o. female presents today as a patient who is known to me and I have within the past year.  She presents today with complaint of calluses on both of her feet.  She states her extremely sore she points to the ball of the right foot and the heel and the ball of the left foot. She states she does get a burning type sensation in her feet.  She also is complaining that her  veins are showing more on her legs and that they do swell quite a bit.  All other ROS Negative    Physical Exam:    Vascular exam       Dorsalis pedis pulse Right  1/4 Left   1/4   Posterior tibial pulse 1+ 1+   Capilary fill time  Less than 3 seconds Less than 3 seconds   Skin temperature cool cool   Edema 0 0   Comments:       Neurological exam      Right Left    Gross sensation intact to touch? yes yes   Paresthesia noted?  no  no   Vibratory sensation? intact intact   Comments:       Dermatologic exam      Right Left    Open wounds or lesions? yes and comments Hyperkeratosis is noted plantar aspect the 2nd met head and the plantar right heel. yes and comments Area hyperkeratosis is noted plantar aspect of the 1st metatarsal head.   Ecchymosis 0 0   Erythema 0 0   Edema 0 0   Hyperpigmentation yes yes   Nail appearance Normal in appearance 1-5 Normal in appearance 1-5   Comments:       Musculoskeletal exam      Right Left    Dorsiflexion 5+ 5+   Plantar flexion 5+ 5+   Inversion 5+ 5+   Eversion 5+ 5+   Intrinsic muscle strength     Range of Motion Pain-free range of motion AJ STJ MPJs Pain-free range of motion AJ STJ MPJs   Crepitus no no   Foot type planus planus   Comments:             Assessment:    ICD-10-CM    1. Venous insufficiency of both lower extremities I87.2    2. Bilateral edema of lower extremity R60.0    3. Callus L84    4. Left foot pain M79.672      Plan of Care:    Did discuss venous insufficiency with the patient today.  Do recommend compressive hose.  Did advise even if she got over-the-counter maybe 10-20 mm of mercury pressure.  This would be better than nothing.  Follow up:    I have asked Sheri Garza to follow up in 3 months or PRN if symptoms worsen or fail to improve.   Clarise Cruz, DPM 06/10/2017, 14:11

## 2017-06-12 ENCOUNTER — Ambulatory Visit (INDEPENDENT_AMBULATORY_CARE_PROVIDER_SITE_OTHER): Payer: Self-pay | Admitting: Foot Surgery

## 2017-06-12 NOTE — Telephone Encounter (Signed)
Regarding: medication removed from chart if possible.   ----- Message from Sallyanne KusterKylie Martina Belcher sent at 06/12/2017  9:15 AM EDT -----  Randie Heinzain pt     Pt calling to request that artificial tear, gonioscop-HPM, (GONIOSOL) 2.5 % Ophthalmic Drops be removed from her chart if possible.   Stating she is not sure what this is but she has never used this medication and would like it removed.   Thank you.

## 2017-06-12 NOTE — Telephone Encounter (Signed)
Message reviewed.   Message from Sallyanne KusterKylie Martina Belcher sent at 06/12/2017 9:15 AM EDT   Summary: medication removed from chart if possible.     Randie Heinzain pt     Pt calling to request that artificial tear, gonioscop-HPM, (GONIOSOL) 2.5 % Ophthalmic Drops be removed from her chart if possible.   Stating she is not sure what this is but she has never used this medication and would like it removed.   Thank you.            Call History    Type Contact Phone User   06/12/2017 09:10 AM Phone (Incoming) Dub MikesVincent, Brit (Self) [phone] Rexene Edison(H) Sallyanne KusterBelcher, Kylie Martina       Medication removed. Dorathy Kinsmanami Muaz Shorey, RN  06/12/2017, 09:23

## 2017-09-16 ENCOUNTER — Encounter (INDEPENDENT_AMBULATORY_CARE_PROVIDER_SITE_OTHER): Payer: Self-pay | Admitting: Foot Surgery

## 2017-09-16 ENCOUNTER — Ambulatory Visit (INDEPENDENT_AMBULATORY_CARE_PROVIDER_SITE_OTHER): Payer: Medicare Other | Admitting: Foot Surgery

## 2017-09-16 VITALS — Resp 18 | Ht 62.0 in | Wt 118.0 lb

## 2017-09-16 DIAGNOSIS — I872 Venous insufficiency (chronic) (peripheral): Secondary | ICD-10-CM

## 2017-09-16 DIAGNOSIS — M79672 Pain in left foot: Secondary | ICD-10-CM

## 2017-09-16 DIAGNOSIS — R600 Localized edema: Secondary | ICD-10-CM

## 2017-09-16 DIAGNOSIS — M79671 Pain in right foot: Secondary | ICD-10-CM

## 2017-09-16 DIAGNOSIS — L84 Corns and callosities: Secondary | ICD-10-CM

## 2017-09-16 NOTE — Progress Notes (Signed)
[place]   Department of [facility]  Podiatry    Sheri Garza is a 82 y.o. female    Chief Complaint   Patient presents with   . Callous           Past Medical History:     Past Medical History:   Diagnosis Date   . Dry eye    . Hyperlipidemia    . Spinal stenosis    . Thyroid nodule          Past Surgical History:   Procedure Laterality Date   . HX CHOLECYSTECTOMY     . HX DILATION AND CURETTAGE     . HX OTHER      trigger finger, repair quad           Family History:  Family Medical History:     Problem Relation (Age of Onset)    Cancer Other, Father, Brother    Diabetes Other, Mother    HTN <20 y.o. Other    Heart Disease Mother              Social History:     Social History     Tobacco Use   . Smoking status: Never Smoker   . Smokeless tobacco: Never Used   Substance Use Topics   . Alcohol use: No       Medications:      No outpatient medications prior to visit.  No facility-administered medications prior to visit.     Allergies:     Allergies   Allergen Reactions   . Flagyl [Metronidazole]    . Gentamicin    . Iodine And Iodide Containing Products    . Other      Antihistamines and all caines, Pt states Xylocaine is ok   . Penicillins    . Tetracycline    . Vancomycin        Patient Active Problem List   Diagnosis   . Right Knee OA   . Thyroid nodule   . Spinal stenosis   . Hyperlipidemia     Ht 1.575 m (_0 )  Wt 53.5 kg (118 lb)  BMI 21.58 kg/m2    DPM Addition to HPI: Sheri Garza is a 82 y.o. female presents today as a patient who is known to me and I have within the past year.  She presents today with complaint of calluses on both of her feet.  She states her extremely sore she points to the ball of the right foot and the heel and the ball of the left foot. She states she does get a burning type sensation in her feet.  She also is complaining that her veins are showing more on her legs and that they do swell quite a bit.  All other ROS Negative    Physical  Exam:    Vascular exam       Dorsalis pedis pulse Right  1/4 Left   1/4   Posterior tibial pulse 1+ 1+   Capilary fill time  Less than 3 seconds Less than 3 seconds   Skin temperature cool cool   Edema 0 0   Comments:       Neurological exam      Right Left    Gross sensation intact to touch? yes yes   Paresthesia noted?  no no   Vibratory sensation? intact intact   Comments:       Dermatologic exam  Right Left    Open wounds or lesions? yes and comments Hyperkeratosis is noted plantar aspect the 2nd met head and the plantar right heel. yes and comments Area hyperkeratosis is noted plantar aspect of the 1st metatarsal head.   Ecchymosis 0 0   Erythema 0 0   Edema 0 0   Hyperpigmentation yes yes   Nail appearance Normal in appearance 1-5 Normal in appearance 1-5   Comments:       Musculoskeletal exam      Right Left    Dorsiflexion 5+ 5+   Plantar flexion 5+ 5+   Inversion 5+ 5+   Eversion 5+ 5+   Intrinsic muscle strength     Range of Motion Pain-free range of motion AJ STJ MPJs Pain-free range of motion AJ STJ MPJs   Crepitus no no   Foot type planus planus   Comments:             Assessment:    ICD-10-CM    1. Venous insufficiency of both lower extremities I87.2    2. Bilateral edema of lower extremity R60.0    3. Callus L84    4. Left foot pain M79.672    5. Right foot pain M79.671      Plan of Care:    Did discuss venous insufficiency with the patient today.  Do recommend compressive hose.    Follow up:    I have asked Sheri Garza to follow up in 3 months or PRN if symptoms worsen or fail to improve.   Sheri Garza, DPM 09/16/2017, 13:59

## 2017-11-13 ENCOUNTER — Encounter (INDEPENDENT_AMBULATORY_CARE_PROVIDER_SITE_OTHER): Payer: Medicare Other | Admitting: Foot Surgery

## 2017-12-16 ENCOUNTER — Encounter (INDEPENDENT_AMBULATORY_CARE_PROVIDER_SITE_OTHER): Payer: Self-pay | Admitting: Foot Surgery

## 2017-12-16 ENCOUNTER — Ambulatory Visit (INDEPENDENT_AMBULATORY_CARE_PROVIDER_SITE_OTHER): Payer: Medicare Other | Admitting: Foot Surgery

## 2017-12-16 VITALS — Temp 98.4°F | Ht 60.0 in | Wt 118.0 lb

## 2017-12-16 DIAGNOSIS — L84 Corns and callosities: Secondary | ICD-10-CM

## 2017-12-16 DIAGNOSIS — R600 Localized edema: Secondary | ICD-10-CM

## 2017-12-16 DIAGNOSIS — M79671 Pain in right foot: Secondary | ICD-10-CM

## 2017-12-16 DIAGNOSIS — I872 Venous insufficiency (chronic) (peripheral): Secondary | ICD-10-CM

## 2017-12-16 DIAGNOSIS — M79672 Pain in left foot: Secondary | ICD-10-CM

## 2017-12-16 NOTE — Progress Notes (Signed)
[place]   Department of [facility]  Podiatry    Sheri Garza is a 82 y.o. female    Chief Complaint   Patient presents with   . Venous Peripheral Insufficiency     of both lower extremities    . Foot Pain     CALLUS             Past Medical History:     Past Medical History:   Diagnosis Date   . Dry eye    . Hyperlipidemia    . Spinal stenosis    . Thyroid nodule          Past Surgical History:   Procedure Laterality Date   . HX CHOLECYSTECTOMY     . HX DILATION AND CURETTAGE     . HX OTHER      trigger finger, repair quad           Family History:  Family Medical History:     Problem Relation (Age of Onset)    Cancer Other, Father, Brother    Diabetes Other, Mother    HTN <20 y.o. Other    Heart Disease Mother              Social History:     Social History     Tobacco Use   . Smoking status: Never Smoker   . Smokeless tobacco: Never Used   Substance Use Topics   . Alcohol use: No       Medications:    MOXEZA 0.5 % Ophthalmic Drops, Viscous, 0.5 mg  prednisoLONE acetate (PRED FORTE) 1 % Ophthalmic Drops, Suspension,     No facility-administered medications prior to visit.       Allergies:     Allergies   Allergen Reactions   . Flagyl [Metronidazole]    . Gentamicin    . Iodine And Iodide Containing Products    . Other      Antihistamines and all caines, Pt states Xylocaine is ok   . Penicillins    . Tetracycline    . Vancomycin        Patient Active Problem List   Diagnosis   . Right Knee OA   . Thyroid nodule   . Spinal stenosis   . Hyperlipidemia     Ht 1.575 m ('5\' 2"'$ )  Wt 53.5 kg (118 lb)  BMI 21.58 kg/m2    DPM Addition to HPI: Sheri Garza is a 82 y.o. female presents today as a patient who is known to me and I have within the past year.  She presents today with complaint of calluses on both of her feet.  She states her extremely sore she points to the ball of the right foot and the heel and the ball of the left foot. She states she does get a burning type sensation in her  feet.  She also is complaining that her veins are showing more on her legs and that they do swell quite a bit.  All other ROS Negative    Physical Exam:    Vascular exam       Dorsalis pedis pulse Right  1/4 Left   1/4   Posterior tibial pulse 1+ 1+   Capilary fill time  Less than 3 seconds Less than 3 seconds   Skin temperature cool cool   Edema 0 0   Comments:       Neurological exam      Right Left  Gross sensation intact to touch? yes yes   Paresthesia noted?  no no   Vibratory sensation? intact intact   Comments:       Dermatologic exam      Right Left    Open wounds or lesions? yes and comments Hyperkeratosis is noted plantar aspect the 2nd met head and the plantar right heel. yes and comments Area hyperkeratosis is noted plantar aspect of the 1st metatarsal head.   Ecchymosis 0 0   Erythema 0 0   Edema 0 0   Hyperpigmentation yes yes   Nail appearance Normal in appearance 1-5 Normal in appearance 1-5   Comments:       Musculoskeletal exam      Right Left    Dorsiflexion 5+ 5+   Plantar flexion 5+ 5+   Inversion 5+ 5+   Eversion 5+ 5+   Intrinsic muscle strength     Range of Motion Pain-free range of motion AJ STJ MPJs Pain-free range of motion AJ STJ MPJs   Crepitus no no   Foot type planus planus   Comments:             Assessment:    ICD-10-CM    1. Venous insufficiency of both lower extremities I87.2    2. Bilateral edema of lower extremity R60.0    3. Callus L84    4. Left foot pain M79.672    5. Right foot pain M79.671      Plan of Care:    Did discuss venous insufficiency with the patient today.  Do recommend compressive hose.    Follow up:    I have asked Sheri Garza to follow up in 3 months or PRN if symptoms worsen or fail to improve.   Clarise Cruz, DPM 12/16/2017, 14:59

## 2018-03-17 ENCOUNTER — Encounter (INDEPENDENT_AMBULATORY_CARE_PROVIDER_SITE_OTHER): Payer: Medicare Other | Admitting: Foot Surgery

## 2018-09-07 ENCOUNTER — Ambulatory Visit (INDEPENDENT_AMBULATORY_CARE_PROVIDER_SITE_OTHER): Payer: Self-pay | Admitting: Internal Medicine

## 2018-09-07 NOTE — Telephone Encounter (Signed)
Regarding: refill  ----- Message from Teodora Medici sent at 09/07/2018 12:20 PM EDT -----  ang    cethalexin    Preferred Pharmacy     [id number] [place], [place]    [address] [place] [address]    Phone: [phone] Fax: [phone]    Not a 24 hour pharmacy; exact hours not known.

## 2018-09-07 NOTE — Telephone Encounter (Signed)
Called and spoke with pt. Pt states she no longer needs the medication she called her dentist . Sheri Ducking, LPN  0/32/1224, 82:50

## 2018-09-13 ENCOUNTER — Encounter (FREE_STANDING_LABORATORY_FACILITY)
Admit: 2018-09-13 | Discharge: 2018-09-13 | Disposition: A | Discharge status: Home / Self Care | Payer: Medicare Other | Attending: Internal Medicine | Admitting: Internal Medicine

## 2018-09-13 ENCOUNTER — Ambulatory Visit (INDEPENDENT_AMBULATORY_CARE_PROVIDER_SITE_OTHER): Payer: Medicare Other | Admitting: Internal Medicine

## 2018-09-13 ENCOUNTER — Encounter (INDEPENDENT_AMBULATORY_CARE_PROVIDER_SITE_OTHER): Payer: Self-pay | Admitting: Internal Medicine

## 2018-09-13 ENCOUNTER — Encounter (FREE_STANDING_LABORATORY_FACILITY): Payer: Medicare Other | Admitting: Internal Medicine

## 2018-09-13 VITALS — BP 120/64 | HR 96 | Temp 98.1°F | Resp 16 | Ht 59.17 in | Wt 108.0 lb

## 2018-09-13 DIAGNOSIS — E785 Hyperlipidemia, unspecified: Secondary | ICD-10-CM

## 2018-09-13 DIAGNOSIS — R509 Fever, unspecified: Secondary | ICD-10-CM | POA: Insufficient documentation

## 2018-09-13 DIAGNOSIS — R531 Weakness: Secondary | ICD-10-CM | POA: Insufficient documentation

## 2018-09-13 DIAGNOSIS — E162 Hypoglycemia, unspecified: Secondary | ICD-10-CM

## 2018-09-13 DIAGNOSIS — R748 Abnormal levels of other serum enzymes: Secondary | ICD-10-CM

## 2018-09-13 LAB — COMPREHENSIVE METABOLIC PNL, FASTING
ALBUMIN: 3.9 g/dL (ref 3.4–4.8)
ALKALINE PHOSPHATASE: 105 U/L (ref 55–145)
ALT (SGPT): 13 U/L (ref <55)
ANION GAP: 6 mmol/L (ref 4–13)
AST (SGOT): 23 U/L (ref 8–41)
BILIRUBIN TOTAL: 0.3 mg/dL (ref 0.3–1.3)
BUN/CREA RATIO: 18 (ref 6–22)
BUN: 12 mg/dL (ref 8–25)
CALCIUM: 9.2 mg/dL (ref 8.5–10.2)
CHLORIDE: 102 mmol/L (ref 96–111)
CO2 TOTAL: 31 mmol/L (ref 22–32)
CREATININE: 0.66 mg/dL (ref 0.49–1.10)
ESTIMATED GFR: >60 mL/min/{1.73_m2} (ref >60)
GLUCOSE: 98 mg/dL (ref 70–105)
POTASSIUM: 3.8 mmol/L (ref 3.5–5.1)
PROTEIN TOTAL: 6.6 g/dL (ref 6.0–8.0)
SODIUM: 139 mmol/L (ref 136–145)

## 2018-09-13 LAB — CBC
HCT: 46 % (ref 34.8–46.0)
HGB: 14.7 g/dL (ref 11.5–16.0)
MCH: 27.7 pg (ref 26.0–32.0)
MCHC: 32 g/dL (ref 31.0–35.5)
MCV: 86.6 fL (ref 78.0–100.0)
MPV: 11.5 fL (ref 8.7–12.5)
PLATELETS: 210 10*3/uL (ref 150–400)
RBC: 5.31 10*6/uL — ABNORMAL HIGH (ref 3.85–5.22)
RDW-CV: 12.7 % (ref 11.5–15.5)
WBC: 7.8 10*3/uL (ref 3.7–11.0)

## 2018-09-13 LAB — CREATINE KINASE (CK), TOTAL, SERUM OR PLASMA: CREATINE KINASE: 77 U/L (ref 25–190)

## 2018-09-13 LAB — SEDIMENTATION RATE: ERYTHROCYTE SEDIMENTATION RATE (ESR): 7 mm/hr (ref 0–20)

## 2018-09-13 NOTE — Progress Notes (Addendum)
OUTPATIENT PROGRESS NOTE    Subjective:   Patient ID:  Sheri Garza is a pleasant 83 y.o. female.    Chief Complaint: Hyperlipidemia (3 mo f/u); Lab Results; and LEG WEAKNESS    History of Present Illness:    The history is provided by the patient.    C/o low grade fever - broken tooth, she had seen dentist - given keflex. Has to be scheduled for oral surgery. No chills. No pain or swelling in mouth. Not sensitive to pain. Feels hot. No cough, no diarrhea, no anosmia/ ageusia.     Had flu like symptoms 3-4 years ago. Was hospitalized for sepsis and delirium. Sugar dropped low.     C/o bilateral leg weakness shakiness. Chronic LBP not worse. H/o DDD lumbar. Denies aggravating alleviating factors. No falls. No loss of control of legs, bladder or bowels.     [facility] 06/24/18. CBC WNL. CMP WNL except for minor increase alkaline phosphatase 125.     LIPID chol 189. HDL 68. LDL 94. Trig 134. Numbers good.     Free T4. 1.03. TSH 1.760. discussed weight loss - she thinks about 8 lbs over 3-4 months. Appetite is ok. Getting shorter. No belly pain.     HM - declined all vaccines.       Allergies:     Allergies   Allergen Reactions   . Flagyl [Metronidazole]    . Gentamicin    . Iodine And Iodide Containing Products    . Other      Antihistamines and all caines, Pt states Xylocaine is ok   . Penicillins    . Tetracycline    . Vancomycin          Medications:   carboxymethylcellulose sodium (REFRESH OPHT), Apply to eye One drop 4-5 times a Sheri  vit C/E/Zn/coppr/lutein/zeaxan (PRESERVISION AREDS-2 ORAL), Take by mouth Take one twice a Sheri    No facility-administered medications prior to visit.         Immunization History:     There is no immunization history on file for this patient.      Past Medical History:     Past Medical History:   Diagnosis Date   . Dry eye    . Hyperlipidemia    . Osteoporosis    . Spinal stenosis    . Thyroid nodule              Past Surgical History:     Past Surgical History:   Procedure Laterality  Date   . HX CATARACT REMOVAL Left    . HX CHOLECYSTECTOMY     . HX DILATION AND CURETTAGE     . HX OTHER      trigger finger, repair quad             Family History:     Family Medical History:     Problem Relation (Age of Onset)    Cancer Other, Father, Brother    Diabetes Other, Mother    HTN <20 y.o. Other    Heart Disease Mother                Social History:   Sheri Garza  reports that she has never smoked. She has never used smokeless tobacco. She reports that she does not drink alcohol or use drugs.        Review of Systems:     Constitutional   + Fatigue, +  Fever  Eyes   Denies: Ocular Pain/Tenderness,  Changes in Vision  HENT   Denies: Nasal Congestion Hoarsness, Headaches, Vertigo/Dizziness  Cardiovascular:   Denies: Chest Pain, Dyspnea on Exertion, Lower Extremity Edema  Respiratory:   Denies: Shortness of Breath, Cough  Gastrointestinal:   Denies: Reflux, Diarrhea, Constipation, Abdominal Pain  Genitourinary:   Denies: Urgency, Frequency, Dysuria  Integument:   Denies: Rash  Neurologic:   + Muscular Weakness, NO Memory Difficulties  Musculoskeletal:   + back pain no  Joint Pain  Endocrine:   Denies: Cold Intolerance, Heat Intolerance  Psychiatric:   Denies: Anxiety, Depression, Difficulty Sleeping  Heme-Lymph:   Denies: Easy Bruising, Lymph Node Enlargement or Tenderness         Objective:   Vitals:    Vitals:    09/13/18 1404   BP: 120/64   Pulse: 96   Resp: 16   Temp: 36.7 C (98.1 F)   TempSrc: Temporal   SpO2: 96%   Weight: 49 kg (108 lb)   Height: 1.503 m (4' 11.17")   BMI: 21.73          Body mass index is 21.69 kg/m.      Constitutional: Alert, well developed, well nourished  HEENT:  Head: NC/AT    Eyes: Sclera anicteric, conjunctiva not injected  Neck:   Supple with normal ROM, no cervical LAD, no thyromegaly, no JVD, no carotid bruits  Cardiovascular: RRR, normal S1/S2, no murmurs/rubs/gallops  Pulmonary:  CTAB, equal air entry, nonlabored, no wheezes/crackles/rhonchi  Abdomen:   NABS,  NT/ND, soft, no HSM, no masses  Musculoskeletal:  More kyphoscoliotic deformity, no injury, no edema  Neurological:   Alert, oriented x 3, no abnormal tone  Skin:     Warm, pink, dry, no rashes, no jaundice, no pallor, no cyanosis  Psychiatric:  Normal mood, affect, behavior, judgment, and thought content      Labs:     No visits with results within 6 Month(s) from this visit.   Latest known visit with results is:   No results found for any previous visit.       Assessment & Plan:     1. Hypoglycemia  History of. Wants to have sugar checked  - [place] RT UPPER QUADRANT; Future  - POCT BLOOD GLUCOSE/FINGERSTICK (AMB)    2. Liver enzyme elevation  Mild increase ALK PHOS. Denies GI symptoms. Discussed skeletal source  - [place] RT UPPER QUADRANT; Future    3. Weakness  Just had dental infection on keflex. Weakness started one week. Had diarrhea on the keflex one time.   - Cbc; Future  - Comprehensive Metabolic Pnl, Fasting; Future  - Sedimentation Rate; Future  - VITAMIN D 25, TOTAL; Future  - Cpk (Ck); Future  Stool c difficile if stools are liquid and persistent. She said it's not been watery now    4. Fever, unspecified fever cause  Dental infection.   - Cbc; Future  - Comprehensive Metabolic Pnl, Fasting; Future  - Sedimentation Rate; Future  - VITAMIN D 25, TOTAL; Future    5. Hyperlipidemia, unspecified hyperlipidemia type  Stable.     If feel worse, go to [facility] ED.    Fall Risk Follow up plan of care: declined PT. no falls lately        Health Maintenance   Topic Date Due   . Adult Tdap-Td (1 - Tdap) 12/06/1954   . Shingles Vaccine (1 of 2) 12/07/1987   . Pneumococcal 65+ Years Low Risk (1 of 1 - PPSV23) 12/07/2002   . Osteoporosis  screening  12/07/2002   . Annual Wellness Visit  01/22/2012   . Influenza Vaccine (1) 09/14/2018   . Depression Screening  09/13/2019       Return in about 1 week (around 09/20/2018).    The patient has been educated and verbalized understanding regarding the services provided during this  visit.    FS not done  Blood draw sugar was 98.     Collier Salina Quetzally Callas, MD  09/13/2018, 14:45

## 2018-09-14 ENCOUNTER — Encounter (INDEPENDENT_AMBULATORY_CARE_PROVIDER_SITE_OTHER): Payer: Self-pay | Admitting: Internal Medicine

## 2018-09-14 ENCOUNTER — Telehealth (INDEPENDENT_AMBULATORY_CARE_PROVIDER_SITE_OTHER): Payer: Self-pay | Admitting: Internal Medicine

## 2018-09-14 ENCOUNTER — Other Ambulatory Visit (INDEPENDENT_AMBULATORY_CARE_PROVIDER_SITE_OTHER): Payer: Medicare Other

## 2018-09-14 ENCOUNTER — Other Ambulatory Visit: Payer: Self-pay

## 2018-09-14 DIAGNOSIS — R748 Abnormal levels of other serum enzymes: Secondary | ICD-10-CM

## 2018-09-14 DIAGNOSIS — E162 Hypoglycemia, unspecified: Secondary | ICD-10-CM

## 2018-09-14 DIAGNOSIS — E559 Vitamin D deficiency, unspecified: Secondary | ICD-10-CM

## 2018-09-14 HISTORY — DX: Vitamin D deficiency, unspecified: E55.9

## 2018-09-14 LAB — VITAMIN D 25, TOTAL: VITAMIN D, 25OH: 20 ng/mL — ABNORMAL LOW (ref 30–100)

## 2018-09-14 NOTE — Telephone Encounter (Signed)
-----   Message from Marikay Alar, MD sent at 09/14/2018 12:56 PM EDT -----  Notify pt  Low D 20. Talk to dr. ang about high dose vitamin D 50000 units once weekly next week with her appt.   Peter Ang, MD  09/14/2018, 12:56

## 2018-09-14 NOTE — Nurses Notes (Signed)
Department of Community Practice     Venipuncture performed in office on left arm antecubital vein, dry pressure dressing was applied to site and patient tolerated it well.  Specimen was centrifuged, aliquoted as needed and specimen was labeled and packaged for transport.    [place], [place]  09/14/2018, 08:53

## 2018-09-14 NOTE — Telephone Encounter (Signed)
Attempted to call patient, no answer at this time. Left message for patient to call back regarding lab results.  Call back number provided [facility] [phone].     [place], [place]  09/14/2018, 15:46

## 2018-09-14 NOTE — Addendum Note (Signed)
Addended by: Marikay Alar on: 09/14/2018 03:58 PM     Modules accepted: Orders

## 2018-09-16 ENCOUNTER — Ambulatory Visit (INDEPENDENT_AMBULATORY_CARE_PROVIDER_SITE_OTHER): Payer: Self-pay | Admitting: Internal Medicine

## 2018-09-16 ENCOUNTER — Encounter (INDEPENDENT_AMBULATORY_CARE_PROVIDER_SITE_OTHER): Payer: Self-pay | Admitting: Internal Medicine

## 2018-09-16 NOTE — Telephone Encounter (Signed)
-----   Message from Roxine Caddy sent at 09/14/2018  3:49 PM EDT -----  Seward Carol    Pt was returning your call about lab results.  Please call to advise.

## 2018-09-16 NOTE — Telephone Encounter (Signed)
Spoke with patient and informed her per Dr.Ang, Low D 20. Talk to Dr. Veneta Penton about high dose vitamin D 50000 units once weekly next week with her appt. Patient expressed understanding. [place], [place]  09/16/2018, 12:03

## 2018-09-17 ENCOUNTER — Telehealth (INDEPENDENT_AMBULATORY_CARE_PROVIDER_SITE_OTHER): Payer: Self-pay | Admitting: Internal Medicine

## 2018-09-17 NOTE — Telephone Encounter (Signed)
Called patient prior to appointment to prescreen for COVID-19.      Have you had new or worsened shortness of breath in the past 14 days?  No  Have you had a new or worsening cough in the past 14 days?  No  Have you had a fever in the past 14 days?  No  Have you experienced a loss of taste or smell in the past 14 days? No  Have you experienced headache with nausea in the past 14 days? No    Patient or patients guardian/attendant has a Negative Prescreen.      Patient informed of visitor policy at this time.    Informed patient that we are requesting all patients and visitors wear a mask when entering the clinic.     Appointment notes have been updated to reflect screening.    Patient instructed to present to the clinic for scheduled appointment.    Sheri Garza  09/17/2018, 11:24

## 2018-09-21 ENCOUNTER — Ambulatory Visit (INDEPENDENT_AMBULATORY_CARE_PROVIDER_SITE_OTHER): Payer: Medicare Other | Admitting: Internal Medicine

## 2018-09-21 ENCOUNTER — Encounter (INDEPENDENT_AMBULATORY_CARE_PROVIDER_SITE_OTHER): Payer: Self-pay | Admitting: Internal Medicine

## 2018-09-21 VITALS — BP 110/60 | HR 88 | Temp 98.6°F | Resp 16 | Ht 59.21 in | Wt 109.1 lb

## 2018-09-21 DIAGNOSIS — M1990 Unspecified osteoarthritis, unspecified site: Secondary | ICD-10-CM | POA: Insufficient documentation

## 2018-09-21 DIAGNOSIS — G4700 Insomnia, unspecified: Secondary | ICD-10-CM | POA: Insufficient documentation

## 2018-09-21 DIAGNOSIS — R5383 Other fatigue: Secondary | ICD-10-CM

## 2018-09-21 DIAGNOSIS — M5030 Other cervical disc degeneration, unspecified cervical region: Secondary | ICD-10-CM | POA: Insufficient documentation

## 2018-09-21 DIAGNOSIS — M549 Dorsalgia, unspecified: Secondary | ICD-10-CM | POA: Insufficient documentation

## 2018-09-21 DIAGNOSIS — E559 Vitamin D deficiency, unspecified: Secondary | ICD-10-CM

## 2018-09-21 DIAGNOSIS — K529 Noninfective gastroenteritis and colitis, unspecified: Secondary | ICD-10-CM

## 2018-09-21 DIAGNOSIS — M48061 Spinal stenosis, lumbar region without neurogenic claudication: Secondary | ICD-10-CM | POA: Insufficient documentation

## 2018-09-21 DIAGNOSIS — M419 Scoliosis, unspecified: Secondary | ICD-10-CM | POA: Insufficient documentation

## 2018-09-21 MED ORDER — CHOLECALCIFEROL (VITAMIN D3) 25 MCG (1,000 UNIT) TABLET
1000.0000 [IU] | ORAL_TABLET | Freq: Every day | ORAL | Status: DC
Start: 2018-09-21 — End: 2019-09-22

## 2018-09-21 NOTE — Addendum Note (Signed)
Addended by: Marikay Alar on: 09/21/2018 01:55 PM     Modules accepted: Orders

## 2018-09-21 NOTE — Progress Notes (Addendum)
OUTPATIENT PROGRESS NOTE    Subjective:   Patient ID:  Sheri Garza is a pleasant 83 y.o. female.    Chief Complaint: Hypoglycemia (follow up); Elevated Liver Enzymes; Hyperlipidemia; and Weakness    History of Present Illness:    The history is provided by the patient.    FF UP on weakness. She is trying to downsize her house. Overall feeling better. She is resting more, taking more time to rest. Trying to sleep earlier.   Reviewed labs 09/13/18. CBC WNL. BMP WNL.   Vitamin D 20. LIVER: The liver is grossly normal in size and echogenicity. Hepatic and  portal veins are patent.    BILARY TREE: No intra or extrahepatic biliary dilatation. The common bile  duct is upper limits of normal measuring 6 mm. Prior GALLBLADDER:  Surgically absent.    PANCREAS: The pancreatic head and proximal body are normal in appearance  with the distal body and tail obscured by overlying bowel gas.    KIDNEY: The right kidney is normal in echogenicity and size measuring 9.3  cm in length.  No evidence of hydronephrosis.    IMPRESSION:  1. Liver is normal in size and appearance.  2. No biliary dilatation in this patient status post remote cholecystectomy    Osteoporosis - causing right chest rib cage is touching the pelvis.     Declined rheumatologist. Declined more testing and meds  Declined GI evaluation.   She is petrified of doing any testing.       Allergies:     Allergies   Allergen Reactions   . Flagyl [Metronidazole]    . Gentamicin    . Iodine And Iodide Containing Products    . Other      Antihistamines and all caines, Pt states Xylocaine is ok   . Penicillins    . Tetracycline    . Vancomycin          Medications:   carboxymethylcellulose sodium (REFRESH OPHT), Apply to eye One drop 4-5 times a day  vit C/E/Zn/coppr/lutein/zeaxan (PRESERVISION AREDS-2 ORAL), Take by mouth Take one twice a day    No facility-administered medications prior to visit.         Immunization History:     There is no immunization history on file for  this patient.      Past Medical History:     Past Medical History:   Diagnosis Date   . Avitaminosis D 09/14/2018   . Back pain    . DDD (degenerative disc disease), cervical    . Dry eye    . Hyperlipidemia    . Insomnia    . Kyphoscoliosis and scoliosis    . Lumbar spinal stenosis    . Osteoarthrosis    . Osteoporosis    . Spinal stenosis    . Thyroid nodule              Past Surgical History:     Past Surgical History:   Procedure Laterality Date   . HX CATARACT REMOVAL Left    . HX CHOLECYSTECTOMY     . HX DILATION AND CURETTAGE     . HX OTHER      trigger finger, repair quad   . HX TONSILLECTOMY               Family History:     Family Medical History:     Problem Relation (Age of Onset)    Cancer Other, Father, Brother    Diabetes  Other, Mother    HTN <20 y.o. Other    Heart Disease Mother                Social History:   Dub Mikesorma Angelica  reports that she has quit smoking. She has never used smokeless tobacco. She reports that she does not drink alcohol or use drugs.        Review of Systems:     Constitutional   + Fatigue, NO  Fever  Eyes   Denies: Ocular Pain/Tenderness, Changes in Vision  HENT   Denies: Nasal Congestion Hoarsness, Headaches, Vertigo/Dizziness  Cardiovascular:   Denies: Chest Pain, Dyspnea on Exertion, Lower Extremity Edema  Respiratory:   Denies: Shortness of Breath, Cough  Gastrointestinal:   Denies: Reflux, + Diarrhea,   NO Constipation, Abdominal Pain  Genitourinary:   Denies: Urgency, Frequency, Dysuria  Integument:   Denies: Rash  Neurologic:   Denies: Muscular Weakness, Memory Difficulties  Musculoskeletal:   Denies: Joint Pain  Endocrine:   Denies: Cold Intolerance, Heat Intolerance  Psychiatric:   Denies: Anxiety, Depression, Difficulty Sleeping  Heme-Lymph:   Denies: Easy Bruising, Lymph Node Enlargement or Tenderness         Objective:   Vitals:    Vitals:    09/21/18 1340   BP: 110/60   Pulse: 88   Resp: 16   Temp: 37 C (98.6 F)   SpO2: 96%   Weight: 49.5 kg (109 lb 2 oz)   Height:  1.504 m (4' 11.21")   BMI: 21.93          Body mass index is 21.88 kg/m.      Constitutional: Alert, well developed, well nourished  HEENT:  Head: NC/AT    Eyes: Sclera anicteric, conjunctiva not injected  Neck:   Supple with normal ROM, no cervical LAD, no thyromegaly, no JVD, no carotid bruits  Cardiovascular: RRR, normal S1/S2, no murmurs/rubs/gallops  Pulmonary:  CTAB, equal air entry, nonlabored, no wheezes/crackles/rhonchi  Abdomen:   NABS, NT/ND, soft, no HSM, no masses  Musculoskeletal:  Kyphotic deformity, no injury, no edema  Neurological:   Alert, oriented x 3, no abnormal tone  Skin:     Warm, pink, dry, no rashes, no jaundice, no pallor, no cyanosis  Psychiatric:  Normal mood, affect, behavior, judgment, and thought content      Labs:     Office Visit on 09/13/2018   Component Date Value Ref Range Status   . WBC 09/13/2018 7.8  3.7 - 11.0 x10^3/uL Final   . RBC 09/13/2018 5.31* 3.85 - 5.22 x10^6/uL Final   . HGB 09/13/2018 14.7  11.5 - 16.0 g/dL Final   . HCT 16/10/960408/31/2020 46.0  34.8 - 46.0 % Final   . MCV 09/13/2018 86.6  78.0 - 100.0 fL Final   . MCH 09/13/2018 27.7  26.0 - 32.0 pg Final   . MCHC 09/13/2018 32.0  31.0 - 35.5 g/dL Final   . RDW-CV 54/09/811908/31/2020 12.7  11.5 - 15.5 % Final   . PLATELETS 09/13/2018 210  150 - 400 x10^3/uL Final   . MPV 09/13/2018 11.5  8.7 - 12.5 fL Final   . SODIUM 09/13/2018 139  136 - 145 mmol/L Final   . POTASSIUM 09/13/2018 3.8  3.5 - 5.1 mmol/L Final   . CHLORIDE 09/13/2018 102  96 - 111 mmol/L Final   . CO2 TOTAL 09/13/2018 31  22 - 32 mmol/L Final   . ANION GAP 09/13/2018 6  4 - 13 mmol/L Final   .  BUN 09/13/2018 12  8 - 25 mg/dL Final   . CREATININE 09/13/2018 0.66  0.49 - 1.10 mg/dL Final   . BUN/CREA RATIO 09/13/2018 18  6 - 22 Final   . ESTIMATED GFR 09/13/2018 >60  >60 mL/min/1.77m^2 Final   . ALBUMIN 09/13/2018 3.9  3.4 - 4.8 g/dL Final   . CALCIUM 09/13/2018 9.2  8.5 - 10.2 mg/dL Final   . GLUCOSE 09/13/2018 98  70 - 105 mg/dL Final    Ranges are for a fasting  specimen only   . ALKALINE PHOSPHATASE 09/13/2018 105  55 - 145 U/L Final   . ALT (SGPT) 09/13/2018 13  <55 U/L Final   . AST (SGOT) 09/13/2018 23  8 - 41 U/L Final   . BILIRUBIN TOTAL 09/13/2018 0.3  0.3 - 1.3 mg/dL Final    Naproxen treatment can falsely elevate total bilirubin levels.   [name] PROTEIN TOTAL 09/13/2018 6.6  6.0 - 8.0 g/dL Final   . ERYTHROCYTE SEDIMENTATION RATE (ES* 09/13/2018 7  0 - 20 mm/hr Final   . VITAMIN D, 25OH 09/13/2018 20* 30 - 100 ng/mL Final    Per Endocrine Society:  Deficient if <= 20 ng/mL  Insufficient if 21-29 ng/mL  Sufficient if >=30 ng/mL    Total 25-OH Vitamin D testing was performed on a [employer] BioPlex analyzer using FDA-approved protocols and multiplexed immunossay.  Vitamin D toxicity is rare and supplementation is generally considered safe; therefore, routine measurement of 25-OH vitamin D is generally not indicated and is not supported as best practice.   [name] CREATINE KINASE 09/13/2018 77  25 - 190 U/L Final       Assessment & Plan:     .1. Avitaminosis D  She only wants to do vitamin D3 1000 units daily    2. Fatigue, unspecified type  Much better with just resting    3. intermittent diarrhea. Declined further evaluation. No weight loss. Bananas do help. Years. No bleeding. No pain.             Health Maintenance   Topic Date Due   . Adult Tdap-Td (1 - Tdap) 12/06/1954   . Shingles Vaccine (1 of 2) 12/07/1987   . Pneumococcal 65+ Years Low Risk (1 of 1 - PPSV23) 12/07/2002   . Osteoporosis screening  12/07/2002   . Annual Wellness Visit  01/22/2012   . Influenza Vaccine (1) 09/14/2018   . Depression Screening  09/13/2019       Return in about 3 months (around 12/21/2018).    The patient has been educated and verbalized understanding regarding the services provided during this visit.      Collier Salina Dereke Neumann, MD  09/21/2018, 13:46

## 2018-09-28 ENCOUNTER — Encounter (INDEPENDENT_AMBULATORY_CARE_PROVIDER_SITE_OTHER): Payer: Self-pay | Admitting: Internal Medicine

## 2018-12-21 ENCOUNTER — Encounter (INDEPENDENT_AMBULATORY_CARE_PROVIDER_SITE_OTHER): Payer: Self-pay | Admitting: Internal Medicine

## 2018-12-22 ENCOUNTER — Other Ambulatory Visit (INDEPENDENT_AMBULATORY_CARE_PROVIDER_SITE_OTHER): Payer: Self-pay

## 2019-08-26 ENCOUNTER — Other Ambulatory Visit: Payer: Self-pay

## 2019-08-26 ENCOUNTER — Ambulatory Visit: Payer: Medicare Other | Attending: EXTERNAL

## 2019-08-26 DIAGNOSIS — L659 Nonscarring hair loss, unspecified: Secondary | ICD-10-CM | POA: Insufficient documentation

## 2019-08-26 LAB — THYROID STIMULATING HORMONE (SENSITIVE TSH): TSH: 0.707 u[IU]/mL (ref 0.350–5.000)

## 2019-09-21 ENCOUNTER — Encounter (FREE_STANDING_LABORATORY_FACILITY): Payer: Medicare Other | Admitting: Internal Medicine

## 2019-09-21 ENCOUNTER — Encounter (FREE_STANDING_LABORATORY_FACILITY)
Admit: 2019-09-21 | Discharge: 2019-09-21 | Disposition: A | Discharge status: Home / Self Care | Payer: Medicare Other | Attending: Internal Medicine | Admitting: Internal Medicine

## 2019-09-21 ENCOUNTER — Ambulatory Visit (INDEPENDENT_AMBULATORY_CARE_PROVIDER_SITE_OTHER): Payer: Medicare Other | Admitting: Internal Medicine

## 2019-09-21 ENCOUNTER — Encounter (INDEPENDENT_AMBULATORY_CARE_PROVIDER_SITE_OTHER): Payer: Self-pay | Admitting: Internal Medicine

## 2019-09-21 ENCOUNTER — Other Ambulatory Visit: Payer: Self-pay

## 2019-09-21 VITALS — BP 120/66 | HR 82 | Temp 97.6°F | Wt 106.0 lb

## 2019-09-21 DIAGNOSIS — M48061 Spinal stenosis, lumbar region without neurogenic claudication: Secondary | ICD-10-CM

## 2019-09-21 DIAGNOSIS — E782 Mixed hyperlipidemia: Secondary | ICD-10-CM

## 2019-09-21 DIAGNOSIS — Z0000 Encounter for general adult medical examination without abnormal findings: Secondary | ICD-10-CM

## 2019-09-21 DIAGNOSIS — M419 Scoliosis, unspecified: Secondary | ICD-10-CM | POA: Insufficient documentation

## 2019-09-21 DIAGNOSIS — R748 Abnormal levels of other serum enzymes: Secondary | ICD-10-CM | POA: Insufficient documentation

## 2019-09-21 DIAGNOSIS — E041 Nontoxic single thyroid nodule: Secondary | ICD-10-CM

## 2019-09-21 DIAGNOSIS — M810 Age-related osteoporosis without current pathological fracture: Secondary | ICD-10-CM

## 2019-09-21 DIAGNOSIS — E559 Vitamin D deficiency, unspecified: Secondary | ICD-10-CM

## 2019-09-21 LAB — COMPREHENSIVE METABOLIC PANEL, NON-FASTING
ALBUMIN: 3.6 g/dL (ref 3.4–4.8)
ALKALINE PHOSPHATASE: 122 U/L (ref 55–145)
ALT (SGPT): 12 U/L (ref 8–22)
ANION GAP: 7 mmol/L (ref 4–13)
AST (SGOT): 24 U/L (ref 8–45)
BILIRUBIN TOTAL: 0.5 mg/dL (ref 0.3–1.3)
BUN/CREA RATIO: 15 (ref 6–22)
BUN: 10 mg/dL (ref 8–25)
CALCIUM: 9.4 mg/dL (ref 8.8–10.2)
CHLORIDE: 103 mmol/L (ref 96–111)
CO2 TOTAL: 31 mmol/L (ref 23–31)
CREATININE: 0.68 mg/dL (ref 0.60–1.05)
ESTIMATED GFR: 82 mL/min/BSA (ref >=60)
GLUCOSE: 89 mg/dL (ref 65–125)
POTASSIUM: 4.3 mmol/L (ref 3.5–5.1)
PROTEIN TOTAL: 6.8 g/dL (ref 6.0–8.0)
SODIUM: 141 mmol/L (ref 136–145)

## 2019-09-21 LAB — LIPID PANEL
CHOL/HDL RATIO: 2.9
CHOLESTEROL: 168 mg/dL (ref 100–200)
HDL CHOL: 57 mg/dL (ref >=50)
LDL CALC: 76 mg/dL (ref <100)
NON-HDL: 111 mg/dL (ref <=190)
TRIGLYCERIDES: 173 mg/dL — ABNORMAL HIGH (ref <150)
VLDL CALC: 35 mg/dL — ABNORMAL HIGH (ref <30)

## 2019-09-21 NOTE — Nursing Note (Signed)
09/21/19 1136   Depression Screen   Little interest or pleasure in doing things. 1   Feeling down, depressed, or hopeless 0   PHQ 2 Total 1   Fall Risk Assessment   Do you feel unsteady when standing or walking? Yes   Do you worry about falling? No   Have you fallen in the past year? No

## 2019-09-21 NOTE — Patient Instructions (Signed)
Medicare Preventive Services  Medicare coverage information Recommendation for YOU   Heart Disease and Diabetes   Lipid profile Every 5 years or more often if at risk for cardiovascular disease     Diabetes Screening  yearly for those at risk for diabetes, 2 tests per year for those with prediabetes Last Glucose: 98    Diabetes Self Management Training or Medical Nutrition Therapy  For those with diabetes, up to 10 hrs initial training within a year, subsequent years up to 2 hrs of follow up training Optional for those with diabetes     Medical Nutrition Therapy Three hours of one-on-one counseling in first year, two hours in subsequent years Optional for those with diabetes, kidney disease   Intensive Behavioral Therapy for Obesity  Face-to-face counseling, first month every week, month 2-6 every other week, month 7-12 every month if continued progress is documented Optional for those with Body Mass Index 30 or higher  Your Body mass index is 21.26 kg/m.   Tobacco Cessation (Quitting) Counseling   Two attempts per year, max 4 sessions per attempt, up to 8 per year, for those with tobacco-related health condition Optional for those that use tobacco   Cancer Screening   Colorectal screening   For anyone age 90 to 86 or any age if high risk:  . Screening Colonoscopy every 10 yrs if low risk,  more frequent if higher risk  OR  . Flexible  Sigmoidoscopy  every 5 yr OR  . Fecal Occult Blood Testing yearly OR  . Cologuard Stool DNA test once every 3 years OR  . CT Colonography every 5 yrs    See your schedule below   Screening Pap Test Recommended every 3 years for all women age 56 to 66, or every five years if combined with HPV test (routine screening not needed after total hysterectomy).  Medicare covers every 2 years, up to yearly if high risk.  Screening Pelvic Exam Medicare covers every 2 years, yearly if high risk or childbearing age with abnormal Pap in last 3 yrs. See your schedule below   Screening Mammogram    Recommended every 1-2 years for women age 47 to 37, and selectively recommended for women between 27-49 based on shared decisions about risk. Covered by Medicare up to every year for women age 39 or older See your schedule below   Lung Cancer Screening  Annual low dose computed tomography (LDCT scan) is recommended for those age 13-77 who smoked 30 pack-years and are current smokers or quit smoking within past 15 years (one pack-year= smoking one PPD for one year), after counseling by your doctor or nurse clinician about the possible benefits or harms See your schedule below   Vaccinations   Pneumococcal Vaccine Recommended routinely age 26+ with two separate vaccines one year apart (Prevnar then Pneumovax).  Recommended before age 37 if medical conditions increase risk  Seasonal Influenza Vaccine Once every flu season   Hepatitis B Vaccine 3 doses if risk (including anyone with diabetes or liver disease)  Shingles Vaccine Once or twice at age 44 or older  Diphtheria Tetanus Pertussis Vaccine ONCE as adult, booster every 10 years     Immunization History   Administered Date(s) Administered   . Covid-19 Vaccine,Pfizer-BioNTech 04/28/2019, 05/19/2019     Shingles vaccine and Diphtheria Tetanus Pertussis vaccines are available at pharmacies or local health department without a prescription.   Other Screening   Bone Densitometry   Every 24 months for anyone at risk,  including postmenopausal       Glaucoma Screening   Yearly if in high risk group such as diabetes, family history, African American age 83+ or Hispanic American age 44+      Hepatitis C Screening recommended ONCE for those born between 1945-1965, or high risk for HCV infection       HIV Testing recommended routinely at least ONCE, covered every year for age 58 to 66 regardless of risk, and every year for age over 67 who ask for the test or higher risk  Yearly or up to 3 times in pregnancy         Abdominal Aortic Aneurysm Screening Ultrasound   Once  between the age of 35-75 with a family history of AAA       Your Personalized Schedule for Preventive Tests   Health Maintenance: Pending and Last Completed       Date Due Completion Date    Influenza Vaccine (1) Never done ---    Pneumococcal 65+ Years Low Risk (1 of 1 - PPSV23) 09/20/2020 (Originally 12/07/2002) ---    Depression Screening 09/20/2020 09/21/2019    Annual Wellness Visit 09/20/2020 09/21/2019    Osteoporosis screening 10/19/2023 10/18/2013

## 2019-09-21 NOTE — Progress Notes (Signed)
INTERNAL MEDICINE, [facility]  [address]  [place] [address]  [facility]  Medicare Annual Wellness Visit    Name: Sheri Garza MRN:  [id number]   Date: 09/21/2019 Age: 84 y.o.       SUBJECTIVE:   Sheri Garza is a 84 y.o. female for presenting for Medicare Wellness exam and one year follow up    HLD Osteoporosis low D. Abnormal liver enzymes    I have reviewed and reconciled the medication list with the patient today.    Comprehensive Health Assessment:  Paper document COMPREHENSIVE HEALTH ASSESSMENT reviewed, signed and scanned into medical record    I have reviewed and updated as appropriate the past medical, family and social history. 09/21/2019 as summarized below:  Past Medical History:   Diagnosis Date   . Avitaminosis D 09/14/2018   . Back pain    . DDD (degenerative disc disease), cervical    . Dry eye    . Hyperlipidemia    . Insomnia    . Kyphoscoliosis and scoliosis    . Lumbar spinal stenosis    . Osteoarthrosis    . Osteoporosis    . Spinal stenosis    . Thyroid nodule      Past Surgical History:   Procedure Laterality Date   . Hx cataract removal Left    . Hx cholecystectomy     . Hx dilation and curettage     . Hx other     . Hx tonsillectomy       Current Outpatient Medications   Medication Sig   . carboxymethylcellulose sodium (REFRESH OPHT) Apply to eye One drop 4-5 times a day   . cholecalciferol, vitamin D3, 25 mcg (1,000 unit) Oral Tablet Take 1 Tab (1,000 Units total) by mouth Once a day   . vit C/E/Zn/coppr/lutein/zeaxan (PRESERVISION AREDS-2 ORAL) Take by mouth Take one twice a day     Family Medical History:     Problem Relation (Age of Onset)    Cancer Other, Father, Brother    Diabetes Other, Mother    HTN <20 y.o. Other    Heart Disease Mother            Social History     Socioeconomic History   . Marital status: Widowed     Spouse name: Not on file   . Number of children: Not on file   . Years of education: Not on file   . Highest education level: Not on  file   Tobacco Use   . Smoking status: Former Games developer   . Smokeless tobacco: Never Used   Substance and Sexual Activity   . Alcohol use: No   . Drug use: No     Social Determinants of Health     Financial Resource Strain:    . Difficulty of Paying Living Expenses:    Food Insecurity:    . Worried About Programme researcher, broadcasting/film/video in the Last Year:    . Barista in the Last Year:    Transportation Needs:    . Freight forwarder (Medical):    [name] Lack of Transportation (Non-Medical):    Physical Activity:    . Days of Exercise per Week:    . Minutes of Exercise per Session:    Stress:    . Feeling of Stress :    Intimate Partner Violence:    . Fear of Current or Ex-Partner:    . Emotionally Abused:    .  Physically Abused:    . Sexually Abused:      Review of Systems: Pertinent items are noted in HPI.     List of Current Health Care Providers   Care Team     PCP     Name Type Specialty Phone Number    Candelario Steppe, Theron Arista, MD Physician EXTERNAL [phone]          Care Team     No care team found                  Health Maintenance   Topic Date Due   . Influenza Vaccine (1) Never Garza   . Pneumococcal 65+ Years Low Risk (1 of 1 - PPSV23) 09/20/2020 (Originally 12/07/2002)   . Depression Screening  09/20/2020   . Annual Wellness Visit  09/20/2020   . Osteoporosis screening  10/19/2023   . Covid-19 Vaccine  Completed   . Adult Tdap-Td  Discontinued   . Shingles Vaccine  Discontinued     Medicare Wellness Assessment   Medicare initial or wellness physical in the last year?: No  Advance Directives (optional)   Does patient have a living will or MPOA: YES   Has patient provided [employer] with a copy?: YES   Advance directive information given to the patient today?: no      Activities of Daily Living   Do you need help with dressing, bathing, or walking?: No   Do you need help with shopping, housekeeping, medications, or finances?: No   Do you have rugs in hallways, broken steps, or poor lighting?: No   Do you have grab bars in  your bathroom, non-slip strips in your tub, and hand rails on your stairs?: Yes   Urinary Incontinence Screen (Women >=65 only)   Do you ever leak urine when you don't want to?: No   Cognitive Function Screen (1=Yes, 0=No)   What is you age?: Correct   What is the time to the nearest hour?: Correct   What is the year?: Correct   What is the name of this clinic?: Correct   Can the patient recognize two persons (the doctor, the nurse, home help, etc.)?: Correct   What is the date of your birth? (day and month sufficient) : Correct   In what year did World War II end?: Correct   Who is the current president of the [place]?: Correct   Count from 20 down to 1?: Correct   What address did I give you earlier?: Correct   Total Score: 10   Interpretation of Total Score: Greater than 6 Normal   Hearing Screen   Have you noticed any hearing difficulties?: Yes  After whispering 9-1-6 how many numbers did the patient repeat correctly?: 3   Fall Risk Screen   Do you feel unsteady when standing or walking?: No  Do you worry about falling?: No  Have you fallen in the past year?: No   Vision Screen           Depression Screen   Little interest or pleasure in doing things.: Not at all  Feeling down, depressed, or hopeless: Not at all  PHQ 2 Total: 0        OBJECTIVE:   BP 120/66   Pulse 82   Temp 36.4 C (97.6 F)   Wt 48.1 kg (106 lb 0.7 oz)   SpO2 98%   BMI 21.26 kg/m        Other appropriate exam:  Health Maintenance Due   Topic Date Due   . Influenza Vaccine (1) Never Garza      ASSESSMENT & PLAN:   1. Encounter for Medicare annual wellness exam    2. Abnormal liver enzymes    3. Osteoporosis, unspecified osteoporosis type, unspecified pathological fracture presence    4. Mixed hyperlipidemia    5. Avitaminosis D    6. Kyphoscoliosis and scoliosis    7. Lumbar spinal stenosis    8. Thyroid nodule       Identified Risk Factors/ Recommended Actions       The PHQ 2 Total: 0 depression screen is interpreted as  negative.    Orders Placed This Encounter   . US THYROID   . COMPREHENSIVE METABOLIC PANEL, NON-FASTING   . LIPID PANEL   . VITAMIN D 25, TOTAL          The patient has been educated about risk factors and recommended preventive care. Written Prevention Plan completed/ updated and given to patient (see After Visit Summary).    Return in about 4 months (around 01/21/2020).    Clemon ChambersPeter Ricquel Foulk, MD  INTERNAL MEDICINE, [facility]  [address]  [place] [id number]  Phone: [phone]  Fax: [phone]                     OUTPATIENT PROGRESS NOTE    Subjective:   Patient ID:  Ms. Oswaldo DoneVincent is a pleasant 84 y.o. female.    Chief Complaint: Follow-up (ARMD chronic low back pain) and Medicare Annual  ARMD. LBP. Thyroid nodule, abnormal liver enzymes. HLD     History of Present Illness:  The history is provided by the patient.  Last seen 08/2018      ARMD sees dr. Hyacinth MeekerMiller for shots  Chronic LBP. - stable.   HM covid shots Garza  HM declined other shots  Getting shorter and more bent  Declined more meds -  For osteoporosis,does not want want to do calcium vitamin D    TSH skin specialist ordered. 0.707.   US RUQ 09/14/2018  Discussed doing thyroid US pros and cons          Allergies:     Allergies   Allergen Reactions   . Flagyl [Metronidazole]    . Gentamicin    . Iodine And Iodide Containing Products    . Other      Antihistamines and all caines, Pt states Xylocaine is ok   . Penicillins    . Tetracycline    . Vancomycin          Medications:   carboxymethylcellulose sodium (REFRESH OPHT), Apply to eye One drop 4-5 times a day  cholecalciferol, vitamin D3, 25 mcg (1,000 unit) Oral Tablet, Take 1 Tab (1,000 Units total) by mouth Once a day  vit C/E/Zn/coppr/lutein/zeaxan (PRESERVISION AREDS-2 ORAL), Take by mouth Take one twice a day    No facility-administered medications prior to visit.        Immunization History:     Immunization History   Administered Date(s) Administered   . Covid-19 Vaccine,Pfizer-BioNTech  04/28/2019, 05/19/2019         Past Medical History:     Past Medical History:   Diagnosis Date   . Avitaminosis D 09/14/2018   . Back pain    . DDD (degenerative disc disease), cervical    . Dry eye    . Hyperlipidemia    . Insomnia    . Kyphoscoliosis and scoliosis    .  Lumbar spinal stenosis    . Osteoarthrosis    . Osteoporosis    . Spinal stenosis    . Thyroid nodule              Past Surgical History:     Past Surgical History:   Procedure Laterality Date   . HX CATARACT REMOVAL Left    . HX CHOLECYSTECTOMY     . HX DILATION AND CURETTAGE     . HX OTHER      trigger finger, repair quad   . HX TONSILLECTOMY               Family History:     Family Medical History:     Problem Relation (Age of Onset)    Cancer Other, Father, Brother    Diabetes Other, Mother    HTN <20 y.o. Other    Heart Disease Mother                Social History:   Sheri Garza  reports that she has quit smoking. She has never used smokeless tobacco. She reports that she does not drink alcohol and does not use drugs.        Review of Systems:     Constitutional   Denies: Fever  Eyes   Denies: Ocular Pain, ++ Changes in Vision  HENT   Denies: Nasal Congestion Hoarsness, Headaches, Dizziness  Cardiovascular:   Denies: Chest Pain, Dyspnea on Exertion, orthopnea, Lower Extremity Edema  Respiratory:   Denies: Shortness of Breath, Cough  Gastrointestinal:   Denies: Reflux, Diarrhea, Constipation, Abdominal Pain  Genitourinary:   Denies: Urgency, Frequency, Dysuria  Integument:   Denies: Rash  Neurologic:   Denies: Muscular Weakness, Memory Difficulties  Musculoskeletal:   Denies: Joint Pain  Endocrine:   Denies: Cold Intolerance, Heat Intolerance, unintentional weight loss, unintentional weight gain  Psychiatric:   Denies: Anxiety, Depression, Difficulty Sleeping  Heme-Lymph:   Denies: Easy Bruising, Lymph Node Enlargement          Objective:   Vitals:    Vitals:    09/21/19 1133   BP: 120/66   Pulse: 82   Temp: 36.4 C (97.6 F)   SpO2: 98%    Weight: 48.1 kg (106 lb 0.7 oz)          Body mass index is 21.26 kg/m.      Constitutional: Alert, well developed, well nourished  HEENT:  Head: NC/AT    Eyes: Sclera anicteric, conjunctivae not injected    facemasked    Neck:   Supple with normal ROM, no cervical LAD, no thyromegaly, no JVD  Cardiovascular: RRR, normal S1/S2, no murmurs/rubs/gallops  NO LEG edema  Pulmonary:  CTAB, equal air entry, nonlabored, no wheezes/crackles/rhonchi  Abdomen:    soft, no HSM, non tender, no masses, normal BS  Musculoskeletal:  + kyphoscoliosis deformity  Neurological:   Alert, oriented x 3, no abnormal tone  Skin:     dry  Psychiatric:  Normal mood, affect, behavior, judgment, and thought content    Motor 5/5 both legs arms  Negative SLR both legs    Slow gait steady  Sway test    Labs:     No visits with results within 1 Week(s) from this visit.   Latest known visit with results is:   Appointment on 08/26/2019   Component Date Value Ref Range Status   . TSH 08/26/2019 0.707  0.350 - 5.000 uIU/mL Final    The reference range for  TSH in infants < 58 days old approximates that of older infants (0.300 -6.500). However, the upper reference range limit may be high in very young infants and infants born prematurely. Clinical and other correlations are required.       Assessment & Plan:     Medical decision making/treatment plan based upon reviewed medical diagnosis, discussion and pertinent patient instructions/directions/orders    The PHQ 2 Total: 0 depression screen is interpreted as negative.      ICD-10-CM    1. Encounter for Medicare annual wellness exam  Z00.00    2. Abnormal liver enzymes  R74.8 COMPREHENSIVE METABOLIC PANEL, NON-FASTING     COMPREHENSIVE METABOLIC PANEL, NON-FASTING   3. Osteoporosis, unspecified osteoporosis type, unspecified pathological fracture presence  M81.0 COMPREHENSIVE METABOLIC PANEL, NON-FASTING     COMPREHENSIVE METABOLIC PANEL, NON-FASTING   4. Mixed hyperlipidemia  E78.2 COMPREHENSIVE  METABOLIC PANEL, NON-FASTING     LIPID PANEL     COMPREHENSIVE METABOLIC PANEL, NON-FASTING     LIPID PANEL   5. Avitaminosis D  E55.9 COMPREHENSIVE METABOLIC PANEL, NON-FASTING     VITAMIN D 25, TOTAL     COMPREHENSIVE METABOLIC PANEL, NON-FASTING     VITAMIN D 25, TOTAL   6. Kyphoscoliosis and scoliosis  M41.9 COMPREHENSIVE METABOLIC PANEL, NON-FASTING     COMPREHENSIVE METABOLIC PANEL, NON-FASTING   7. Lumbar spinal stenosis  M48.061 COMPREHENSIVE METABOLIC PANEL, NON-FASTING     COMPREHENSIVE METABOLIC PANEL, NON-FASTING   8. Thyroid nodule  E04.1 US THYROID     Orders Placed This Encounter   . US THYROID   . COMPREHENSIVE METABOLIC PANEL, NON-FASTING   . LIPID PANEL   . VITAMIN D 25, TOTAL     She is requesting for her medical records for her to take with her. Referred her to office [facility], who talked to her about requesting it via HIM    She declined more testing for DEXA, back pain, thyroid US. Says she is ok.     Health Maintenance   Topic Date Due   . Influenza Vaccine (1) Never Garza   . Pneumococcal 65+ Years Low Risk (1 of 1 - PPSV23) 09/20/2020 (Originally 12/07/2002)   . Depression Screening  09/20/2020   . Annual Wellness Visit  09/20/2020   . Osteoporosis screening  10/19/2023   . Covid-19 Vaccine  Completed   . Adult Tdap-Td  Discontinued   . Shingles Vaccine  Discontinued       Return in about 4 months (around 01/21/2020).    The patient has been educated and verbalized understanding regarding the services provided during this visit.      Thomasene Dubow, MD  09/21/2019, 12:50

## 2019-09-21 NOTE — Nursing Note (Signed)
Department of Community Practice     Venipuncture performed in office on left arm antecubital vein, dry pressure dressing was applied to site and patient tolerated it well.  Specimen was centrifuged, aliquoted as needed and specimen was labeled and packaged for transport.    Elenore Paddy Samanda Buske, MA  09/21/2019, 12:21

## 2019-09-22 ENCOUNTER — Other Ambulatory Visit (INDEPENDENT_AMBULATORY_CARE_PROVIDER_SITE_OTHER): Payer: Self-pay | Admitting: Internal Medicine

## 2019-09-22 DIAGNOSIS — E559 Vitamin D deficiency, unspecified: Secondary | ICD-10-CM

## 2019-09-22 LAB — VITAMIN D 25, TOTAL: VITAMIN D, 25OH: 21 ng/mL — ABNORMAL LOW (ref 30–100)

## 2019-09-22 MED ORDER — ERGOCALCIFEROL (VITAMIN D2) 1,250 MCG (50,000 UNIT) CAPSULE
50000.0000 [IU] | ORAL_CAPSULE | ORAL | 0 refills | Status: DC
Start: 2019-09-22 — End: 2020-02-03

## 2019-09-22 NOTE — Progress Notes (Signed)
Pls call pt. Low vitamin d. RX vitamin D2  50000 units one pill every week for 13 weeks prescription.  Check BMP in two weeks (to monitor calcium)    Clemon Chambers, MD  09/22/2019, 18:52

## 2019-09-29 ENCOUNTER — Other Ambulatory Visit (INDEPENDENT_AMBULATORY_CARE_PROVIDER_SITE_OTHER): Payer: Self-pay

## 2019-10-04 ENCOUNTER — Other Ambulatory Visit (INDEPENDENT_AMBULATORY_CARE_PROVIDER_SITE_OTHER): Payer: Medicare Other

## 2019-10-04 ENCOUNTER — Ambulatory Visit (INDEPENDENT_AMBULATORY_CARE_PROVIDER_SITE_OTHER): Payer: Self-pay | Admitting: Internal Medicine

## 2019-10-04 NOTE — Telephone Encounter (Signed)
Notified pt- she states she has not started high dose vitamin D- advised pt to start taking and rescheduled labs for 2 weeks after starting medication. Isabell Jarvis, RN  10/04/2019, 12:04

## 2019-10-04 NOTE — Telephone Encounter (Signed)
pls tell pt    The BMP is to check Calcium (make sure it does not go TOO HIGH) since we prescribed     High dose of vitamin D 32440 units two weeks ago.    Precautionary check    Clemon Chambers, MD  10/04/2019, 11:54

## 2019-10-04 NOTE — Telephone Encounter (Signed)
Regarding: wants to know why she has an appt  ----- Message from Marzella Schlein sent at 10/04/2019 10:30 AM EDT -----  Clemon Chambers, MD    Pt wants to know why she has an appt for a nurse visit.  Please call pt to advise.

## 2019-10-04 NOTE — Telephone Encounter (Signed)
I called pt to explain that this is a lab recheck to check BMP r/t Ca levels. Pt states she would prefer not to have this done because her insurance premiums have increased. Ca levels were normal. Will consult Dr. Weldon Inches, RN  10/04/2019, 10:35

## 2019-10-04 NOTE — Telephone Encounter (Signed)
pls tell pt    It is more precautionary    Sheri Arista Karilyn Wind, MD  10/04/2019, 11:57

## 2019-10-07 ENCOUNTER — Encounter (INDEPENDENT_AMBULATORY_CARE_PROVIDER_SITE_OTHER): Payer: Self-pay | Admitting: Foot Surgery

## 2019-10-10 ENCOUNTER — Encounter (INDEPENDENT_AMBULATORY_CARE_PROVIDER_SITE_OTHER): Payer: Self-pay | Admitting: Foot Surgery

## 2019-10-10 ENCOUNTER — Other Ambulatory Visit: Payer: Self-pay

## 2019-10-10 ENCOUNTER — Ambulatory Visit (INDEPENDENT_AMBULATORY_CARE_PROVIDER_SITE_OTHER): Payer: Medicare Other | Admitting: Foot Surgery

## 2019-10-10 VITALS — BP 146/70 | HR 100 | Temp 97.6°F | Resp 16 | Ht 59.0 in | Wt 103.7 lb

## 2019-10-10 DIAGNOSIS — M79671 Pain in right foot: Secondary | ICD-10-CM

## 2019-10-10 DIAGNOSIS — L84 Corns and callosities: Secondary | ICD-10-CM

## 2019-10-10 DIAGNOSIS — I872 Venous insufficiency (chronic) (peripheral): Secondary | ICD-10-CM

## 2019-10-10 DIAGNOSIS — M79672 Pain in left foot: Secondary | ICD-10-CM

## 2019-10-10 NOTE — Progress Notes (Signed)
[place] [facility]IC[facility]   Department of [facility]  Podiatry    Sheri Garza is a 84 y.o. female    Chief Complaint   Patient presents with    Diabetes Foot Check           Past Medical History:     Past Medical History:   Diagnosis Date    Avitaminosis D 09/14/2018    Back pain     DDD (degenerative disc disease), cervical     Dry eye     Hyperlipidemia     Insomnia     Kyphoscoliosis and scoliosis     Lumbar spinal stenosis     Osteoarthrosis     Osteoporosis     Spinal stenosis     Thyroid nodule          Past Surgical History:   Procedure Laterality Date    HX CATARACT REMOVAL Left     HX CHOLECYSTECTOMY      HX DILATION AND CURETTAGE      HX OTHER      trigger finger, repair quad    HX TONSILLECTOMY             Family History:  Family Medical History:     Problem Relation (Age of Onset)    Cancer Other, Father, Brother    Diabetes Other, Mother    HTN <20 y.o. Other    Heart Disease Mother              Social History:     Social History     Tobacco Use    Smoking status: Former Smoker    Smokeless tobacco: Never Used   Substance Use Topics    Alcohol use: No       Medications:    carboxymethylcellulose sodium (REFRESH OPHT), Apply to eye One drop 4-5 times a day  ergocalciferol, vitamin D2, (DRISDOL) 1,250 mcg (50,000 unit) Oral Capsule, Take 1 Capsule (50,000 Units total) by mouth Every 7 days Indications: vitamin D deficiency (high dose therapy)  vit C/E/Zn/coppr/lutein/zeaxan (PRESERVISION AREDS-2 ORAL), Take by mouth Take one twice a day    No facility-administered medications prior to visit.      Allergies:     Allergies   Allergen Reactions    Flagyl [Metronidazole]     Gentamicin     Iodine And Iodide Containing Products     Other      Antihistamines and all caines, Pt states Xylocaine is ok    Penicillins     Tetracycline     Vancomycin        Patient Active Problem List   Diagnosis    Right Knee OA    Thyroid nodule    Spinal stenosis    Hyperlipidemia     Avitaminosis D    Back pain    DDD (degenerative disc disease), cervical    Insomnia    Kyphoscoliosis and scoliosis    Lumbar spinal stenosis    Osteoarthrosis     Ht 1.575 m ($Remove'5\' 2"'jaDKGQc$ )   Wt 53.5 kg (118 lb)   BMI 21.58 kg/m2    DPM Addition to HPI: Sheri Garza is a 84 y.o. female presents today as a patient who is known to me and I have within the past year.  She presents today with complaint of calluses on both of her feet.  She states her extremely sore she points to the ball of the right foot and the heel and the  ball of the left foot. She states she does get a burning type sensation in her feet.  She also is complaining that her veins are showing more on her legs and that they do swell quite a bit.  All other ROS Negative    Physical Exam:    Vascular exam       Dorsalis pedis pulse Right  1/4 Left   1/4   Posterior tibial pulse 1+ 1+   Capilary fill time  Less than 3 seconds Less than 3 seconds   Skin temperature cool cool   Edema 0 0   Comments:       Neurological exam      Right Left    Gross sensation intact to touch? yes yes   Paresthesia noted?  no no   Vibratory sensation? intact intact   Comments:       Dermatologic exam      Right Left    Open wounds or lesions? yes and comments Hyperkeratosis is noted plantar aspect the 2nd met head and the plantar right heel. yes and comments Area hyperkeratosis is noted plantar aspect of the 1st metatarsal head.   Ecchymosis 0 0   Erythema 0 0   Edema 0 0   Hyperpigmentation yes yes   Nail appearance Normal in appearance 1-5 Normal in appearance 1-5   Comments:       Musculoskeletal exam      Right Left    Dorsiflexion 5+ 5+   Plantar flexion 5+ 5+   Inversion 5+ 5+   Eversion 5+ 5+   Intrinsic muscle strength     Range of Motion Pain-free range of motion AJ STJ MPJs Pain-free range of motion AJ STJ MPJs   Crepitus no no   Foot type planus planus   Comments:             Assessment:    ICD-10-CM    1. Venous insufficiency of both lower extremities  I87.2     2. Callus  L84    3. Left foot pain  M79.672    4. Right foot pain  M79.671      Plan of Care:    Did discuss venous insufficiency with the patient today.  Do recommend compressive hose.    Follow up:    I have asked Sheri Garza to follow up in 3 months or PRN if symptoms worsen or fail to improve.   Ninfa Linden, DPM 10/10/2019, 10:41

## 2019-10-11 ENCOUNTER — Ambulatory Visit (INDEPENDENT_AMBULATORY_CARE_PROVIDER_SITE_OTHER): Payer: Self-pay

## 2019-10-20 ENCOUNTER — Ambulatory Visit (INDEPENDENT_AMBULATORY_CARE_PROVIDER_SITE_OTHER): Payer: Medicare Other

## 2019-10-24 ENCOUNTER — Other Ambulatory Visit (INDEPENDENT_AMBULATORY_CARE_PROVIDER_SITE_OTHER): Payer: Medicare Other

## 2020-01-24 ENCOUNTER — Encounter (INDEPENDENT_AMBULATORY_CARE_PROVIDER_SITE_OTHER): Payer: Self-pay | Admitting: Internal Medicine

## 2020-02-03 ENCOUNTER — Encounter (FREE_STANDING_LABORATORY_FACILITY)
Admit: 2020-02-03 | Discharge: 2020-02-03 | Disposition: A | Discharge status: Home / Self Care | Payer: Medicare Other | Attending: Family Medicine | Admitting: Family Medicine

## 2020-02-03 ENCOUNTER — Encounter (INDEPENDENT_AMBULATORY_CARE_PROVIDER_SITE_OTHER): Payer: Self-pay | Admitting: Family Medicine

## 2020-02-03 ENCOUNTER — Ambulatory Visit (INDEPENDENT_AMBULATORY_CARE_PROVIDER_SITE_OTHER): Payer: Medicare Other | Admitting: Family Medicine

## 2020-02-03 ENCOUNTER — Other Ambulatory Visit: Payer: Self-pay

## 2020-02-03 ENCOUNTER — Encounter (FREE_STANDING_LABORATORY_FACILITY): Payer: Medicare Other | Admitting: Family Medicine

## 2020-02-03 VITALS — BP 110/80 | HR 102 | Temp 97.2°F | Ht 59.0 in | Wt 105.8 lb

## 2020-02-03 DIAGNOSIS — R42 Dizziness and giddiness: Secondary | ICD-10-CM | POA: Insufficient documentation

## 2020-02-03 LAB — BASIC METABOLIC PANEL
ANION GAP: 2 mmol/L — ABNORMAL LOW (ref 4–13)
BUN/CREA RATIO: 18 (ref 6–22)
BUN: 12 mg/dL (ref 8–25)
CALCIUM: 9.7 mg/dL (ref 8.8–10.2)
CHLORIDE: 104 mmol/L (ref 96–111)
CO2 TOTAL: 33 mmol/L — ABNORMAL HIGH (ref 23–31)
CREATININE: 0.67 mg/dL (ref 0.60–1.05)
ESTIMATED GFR: 82 mL/min/BSA (ref >=60)
GLUCOSE: 93 mg/dL (ref 65–125)
POTASSIUM: 4.3 mmol/L (ref 3.5–5.1)
SODIUM: 139 mmol/L (ref 136–145)

## 2020-02-03 NOTE — Nursing Note (Signed)
02/03/20 1555   Depression Screen   Little interest or pleasure in doing things. 0   Feeling down, depressed, or hopeless 0   PHQ 2 Total 0

## 2020-02-07 ENCOUNTER — Encounter (INDEPENDENT_AMBULATORY_CARE_PROVIDER_SITE_OTHER): Payer: Self-pay | Admitting: Family Medicine

## 2020-02-07 NOTE — Progress Notes (Signed)
OUTPATIENT PROGRESS NOTE    Subjective:   Sheri Garza is a pleasant 85 y.o. female here for  Chief Complaint            Dizzy         Patient of Dr Ang comes in today for an acute visit. She is accompanied by her daughter.  She complains of being lightheaded on occasion over the past few days.  No falls; not when rising from lying or seated position; not when rolling over in bed; no visual changes; no spinning sensation, no sense of being pushed; no problems with speech or swallowing. No chest pain or heart palpitations.     Other than ROS in HPI, all other systems are negative.    Reviewed today (Per EMR): active problem list      Past medical History:     Past Medical History:   Diagnosis Date   . Avitaminosis D 09/14/2018   . Back pain    . DDD (degenerative disc disease), cervical    . Dry eye    . Hyperlipidemia    . Insomnia    . Kyphoscoliosis and scoliosis    . Lumbar spinal stenosis    . Osteoarthrosis    . Osteoporosis    . Spinal stenosis    . Thyroid nodule        Past Surgical History:   Procedure Laterality Date   . HX CATARACT REMOVAL Left    . HX CHOLECYSTECTOMY     . HX DILATION AND CURETTAGE     . HX OTHER      trigger finger, repair quad   . HX TONSILLECTOMY             Medications:     Current Outpatient Medications   Medication Sig   . cephalexin (KEFLEX) 500 mg Oral Capsule TAKE 4 CAPSULES BY MOUTH 1 HOUR PRIOR TO APPT       Allergies:     Allergies   Allergen Reactions   . Flagyl [Metronidazole]    . Gentamicin    . Iodine And Iodide Containing Products    . Other      Antihistamines and all caines, Pt states Xylocaine is ok   . Penicillins    . Tetracycline    . Vancomycin        Family History:     Family Medical History:     Problem Relation (Age of Onset)    Cancer Other, Father, Brother    Diabetes Other, Mother    HTN <20 y.o. Other    Heart Disease Mother            Social History:     Social History     Tobacco Use   . Smoking status: Former Games developer   . Smokeless tobacco: Never Used    Substance Use Topics   . Alcohol use: No   . Drug use: No       Objective:   Vitals:  Blood pressure 110/80, pulse (!) 102, temperature 36.2 C (97.2 F), height 1.499 m (4\' 11" ), weight 48 kg (105 lb 13.1 oz), SpO2 92 %.   Body mass index is 21.37 kg/m.  General:  Pleasant, well-appearing, no acute distress  Skin:  Warm, pink, dry, no rashes  HEENT:  NC/AT neck supple, no cervical lymphadenopathy, no carotid bruits. TM's are WNL  Heart: regular rate and rhythm, normal S1/S2, no murmurs, rubs, or gallops  Lungs: Clear to auscultation bilaterally, equal  air entry, no wheezes, rales, or rhonchi  Abdomen: soft, non-tender, non-distended, no CVA, flank or suprapubic tenderness  Musculoskeletal: no clubbing, cyanosis, or edema, pulses 2+ equally,   Neurological: awake, alert, and oriented x3,   No nystagmus; normal nose to finger tests. Gait is normal for age; Sheri Garza are normal. Mood, affect are normal; speech is fluent and directed.       Labs:          Assessment & Plan:   Sheri Garza was seen today for dizzy.    Diagnoses and all orders for this visit:    Dizzy spells  -     BASIC METABOLIC PANEL; Future  -     BASIC METABOLIC PANEL                 Return if symptoms worsen or fail to improve.    Patient and/or caregivers verbalized understanding of treatment plan today. All patients questions were answered in today's visit. If symptoms are worsening or not improving, they will return to clinic or seek evaluation by the ER or an urgent care.       Curtis Sites, MD  02/07/2020, 09:42

## 2020-02-10 ENCOUNTER — Encounter (INDEPENDENT_AMBULATORY_CARE_PROVIDER_SITE_OTHER): Payer: Medicare Other | Admitting: Foot Surgery

## 2020-04-18 ENCOUNTER — Encounter (INDEPENDENT_AMBULATORY_CARE_PROVIDER_SITE_OTHER): Payer: Medicare Other | Admitting: Foot Surgery

## 2020-04-23 ENCOUNTER — Other Ambulatory Visit: Payer: Self-pay

## 2020-04-23 ENCOUNTER — Ambulatory Visit (INDEPENDENT_AMBULATORY_CARE_PROVIDER_SITE_OTHER): Payer: Medicare Other | Admitting: Foot Surgery

## 2020-04-23 ENCOUNTER — Encounter (INDEPENDENT_AMBULATORY_CARE_PROVIDER_SITE_OTHER): Payer: Self-pay | Admitting: Foot Surgery

## 2020-04-23 VITALS — Temp 96.1°F | Ht 59.0 in | Wt 103.5 lb

## 2020-04-23 DIAGNOSIS — L84 Corns and callosities: Secondary | ICD-10-CM

## 2020-04-23 DIAGNOSIS — I872 Venous insufficiency (chronic) (peripheral): Secondary | ICD-10-CM

## 2020-04-23 NOTE — Progress Notes (Signed)
[place]   Department of [facility]  Podiatry    Sheri Garza is a 85 y.o. female    Chief Complaint   Patient presents with   . Callous           Past Medical History:     Past Medical History:   Diagnosis Date   . Avitaminosis D 09/14/2018   . Back pain    . DDD (degenerative disc disease), cervical    . Dry eye    . Hyperlipidemia    . Insomnia    . Kyphoscoliosis and scoliosis    . Lumbar spinal stenosis    . Osteoarthrosis    . Osteoporosis    . Spinal stenosis    . Thyroid nodule          Past Surgical History:   Procedure Laterality Date   . HX CATARACT REMOVAL Left    . HX CHOLECYSTECTOMY     . HX DILATION AND CURETTAGE     . HX OTHER      trigger finger, repair quad   . HX TONSILLECTOMY             Family History:  Family Medical History:     Problem Relation (Age of Onset)    Cancer Other, Father, Brother    Diabetes Other, Mother    HTN <20 y.o. Other    Heart Disease Mother              Social History:     Social History     Tobacco Use   . Smoking status: Former Research scientist (life sciences)   . Smokeless tobacco: Never Used   Substance Use Topics   . Alcohol use: No       Medications:    cephalexin (KEFLEX) 500 mg Oral Capsule, TAKE 4 CAPSULES BY MOUTH 1 HOUR PRIOR TO APPT (Patient not taking: Reported on 04/23/2020)    No facility-administered medications prior to visit.      Allergies:     Allergies   Allergen Reactions   . Flagyl [Metronidazole]    . Gentamicin    . Iodine And Iodide Containing Products    . Other      Antihistamines and all caines, Pt states Xylocaine is ok   . Penicillins    . Tetracycline    . Vancomycin        Patient Active Problem List   Diagnosis   . Right Knee OA   . Thyroid nodule   . Spinal stenosis   . Hyperlipidemia   . Avitaminosis D   . Back pain   . DDD (degenerative disc disease), cervical   . Insomnia   . Kyphoscoliosis and scoliosis   . Lumbar spinal stenosis   . Osteoarthrosis     Ht 1.575 m (5' 2")  Wt 53.5 kg (118 lb)  BMI 21.58 kg/m2    DPM Addition  to HPI: Sheri Garza is a 85 y.o. female presents today as a patient who is known to me and I have within the past year.  She presents today with complaint of calluses on both of her feet.  She states her extremely sore she points to the ball of the right foot and the heel and the ball of the left foot.   All other ROS Negative    Physical Exam:    Vascular exam       Dorsalis pedis pulse Right  1/4 Left   1/4  Posterior tibial pulse 1+ 1+   Capilary fill time  Less than 3 seconds Less than 3 seconds   Skin temperature cool cool   Edema 0 0   Comments:       Neurological exam      Right Left    Gross sensation intact to touch? yes yes   Paresthesia noted?  no no   Vibratory sensation? intact intact   Comments:       Dermatologic exam      Right Left    Open wounds or lesions? yes and comments Hyperkeratosis is noted plantar aspect the 2nd met head and the plantar right heel. yes and comments Area hyperkeratosis is noted plantar aspect of the 1st metatarsal head.   Ecchymosis 0 0   Erythema 0 0   Edema 0 0   Hyperpigmentation yes yes   Nail appearance Normal in appearance 1-5 Normal in appearance 1-5   Comments:       Musculoskeletal exam      Right Left    Dorsiflexion 5+ 5+   Plantar flexion 5+ 5+   Inversion 5+ 5+   Eversion 5+ 5+   Intrinsic muscle strength     Range of Motion Pain-free range of motion AJ STJ MPJs Pain-free range of motion AJ STJ MPJs   Crepitus no no   Foot type planus planus   Comments:             Assessment:   2 ICD-10-CM    1. Venous insufficiency of both lower extremities  I87.2    2. Callus  L84      Plan of Care:  Recommended vaseline to areas of callus daily.   Follow up:    I have asked Shalandria Elsbernd to follow up in 3 months or PRN if symptoms worsen or fail to improve.   Clarise Cruz, DPM 04/23/2020, 15:34

## 2020-07-11 ENCOUNTER — Other Ambulatory Visit (HOSPITAL_COMMUNITY): Payer: Self-pay | Admitting: INTERNAL MEDICINE

## 2020-07-11 DIAGNOSIS — R1031 Right lower quadrant pain: Secondary | ICD-10-CM

## 2020-07-12 ENCOUNTER — Other Ambulatory Visit (HOSPITAL_COMMUNITY): Payer: Self-pay | Admitting: NURSE PRACTITIONER

## 2020-07-12 ENCOUNTER — Other Ambulatory Visit (INDEPENDENT_AMBULATORY_CARE_PROVIDER_SITE_OTHER): Payer: Medicare Other

## 2020-07-12 ENCOUNTER — Other Ambulatory Visit: Payer: Self-pay

## 2020-07-12 DIAGNOSIS — R1031 Right lower quadrant pain: Secondary | ICD-10-CM

## 2020-08-24 ENCOUNTER — Encounter (INDEPENDENT_AMBULATORY_CARE_PROVIDER_SITE_OTHER): Payer: Self-pay | Admitting: Foot Surgery

## 2020-08-24 ENCOUNTER — Other Ambulatory Visit: Payer: Self-pay

## 2020-08-24 ENCOUNTER — Ambulatory Visit (INDEPENDENT_AMBULATORY_CARE_PROVIDER_SITE_OTHER): Payer: Medicare Other | Admitting: Foot Surgery

## 2020-08-24 VITALS — Temp 96.4°F | Resp 16 | Ht 59.0 in | Wt 100.2 lb

## 2020-08-24 DIAGNOSIS — I872 Venous insufficiency (chronic) (peripheral): Secondary | ICD-10-CM

## 2020-08-24 DIAGNOSIS — L84 Corns and callosities: Secondary | ICD-10-CM

## 2020-08-24 NOTE — Progress Notes (Signed)
[place]   Department of [facility]  Podiatry    Sheri Garza is a 85 y.o. female    Chief Complaint   Patient presents with   . Callous           Past Medical History:     Past Medical History:   Diagnosis Date   . Avitaminosis D 09/14/2018   . Back pain    . DDD (degenerative disc disease), cervical    . Dry eye    . Hyperlipidemia    . Insomnia    . Kyphoscoliosis and scoliosis    . Lumbar spinal stenosis    . Osteoarthrosis    . Osteoporosis    . Spinal stenosis    . Thyroid nodule          Past Surgical History:   Procedure Laterality Date   . HX CATARACT REMOVAL Left    . HX CHOLECYSTECTOMY     . HX DILATION AND CURETTAGE     . HX OTHER      trigger finger, repair quad   . HX TONSILLECTOMY             Family History:  Family Medical History:     Problem Relation (Age of Onset)    Cancer Other, Father, Brother    Diabetes Other, Mother    HTN <20 y.o. Other    Heart Disease Mother              Social History:     Social History     Tobacco Use   . Smoking status: Former Research scientist (life sciences)   . Smokeless tobacco: Never Used   Substance Use Topics   . Alcohol use: No       Medications:    cephalexin (KEFLEX) 500 mg Oral Capsule, TAKE 4 CAPSULES BY MOUTH 1 HOUR PRIOR TO APPT (Patient not taking: No sig reported)    No facility-administered medications prior to visit.      Allergies:     Allergies   Allergen Reactions   . Flagyl [Metronidazole]    . Gentamicin    . Iodine And Iodide Containing Products    . Other      Antihistamines and all caines, Pt states Xylocaine is ok   . Penicillins    . Tetracycline    . Vancomycin        Patient Active Problem List   Diagnosis   . Right Knee OA   . Thyroid nodule   . Spinal stenosis   . Hyperlipidemia   . Avitaminosis D   . Back pain   . DDD (degenerative disc disease), cervical   . Insomnia   . Kyphoscoliosis and scoliosis   . Lumbar spinal stenosis   . Osteoarthrosis     Ht 1.575 m (5' 2")  Wt 53.5 kg (118 lb)  BMI 21.58 kg/m2    DPM Addition to HPI:  Sheri Garza is a 85 y.o. female presents today as a patient who is known to me and I have within the past year.  She presents today with complaint of calluses on both of her feet.  She states her extremely sore she points to the ball of the right foot and the heel and the ball of the left foot.   All other ROS Negative    Physical Exam:    Vascular exam       Dorsalis pedis pulse Right  1/4 Left   1/4  Posterior tibial pulse 1+ 1+   Capilary fill time  Less than 3 seconds Less than 3 seconds   Skin temperature cool cool   Edema 0 0   Comments:       Neurological exam      Right Left    Gross sensation intact to touch? yes yes   Paresthesia noted?  no no   Vibratory sensation? intact intact   Comments:       Dermatologic exam      Right Left    Open wounds or lesions? yes and comments Hyperkeratosis is noted plantar aspect the 2nd met head and the plantar right heel. yes and comments Area hyperkeratosis is noted plantar aspect of the 1st metatarsal head.   Ecchymosis 0 0   Erythema 0 0   Edema 0 0   Hyperpigmentation yes yes   Nail appearance Normal in appearance 1-5 Normal in appearance 1-5   Comments:       Musculoskeletal exam      Right Left    Dorsiflexion 5+ 5+   Plantar flexion 5+ 5+   Inversion 5+ 5+   Eversion 5+ 5+   Intrinsic muscle strength     Range of Motion Pain-free range of motion AJ STJ MPJs Pain-free range of motion AJ STJ MPJs   Crepitus no no   Foot type planus planus   Comments:             Assessment:    ICD-10-CM    1. Venous insufficiency of both lower extremities  I87.2    2. Callus  L84      Plan of Care:  Recommended vaseline to areas of callus daily.  Also discussed different forms of padding.  Follow up:    I have asked Ciena Sampley to follow up in 3 months or PRN if symptoms worsen or fail to improve.   Clarise Cruz, DPM 08/24/2020, 11:35

## 2020-08-29 ENCOUNTER — Other Ambulatory Visit (INDEPENDENT_AMBULATORY_CARE_PROVIDER_SITE_OTHER): Payer: Self-pay

## 2020-11-26 ENCOUNTER — Encounter (INDEPENDENT_AMBULATORY_CARE_PROVIDER_SITE_OTHER): Payer: Self-pay | Admitting: Foot Surgery

## 2020-11-26 ENCOUNTER — Ambulatory Visit (INDEPENDENT_AMBULATORY_CARE_PROVIDER_SITE_OTHER): Payer: Medicare Other | Admitting: Foot Surgery

## 2020-11-26 ENCOUNTER — Other Ambulatory Visit: Payer: Self-pay

## 2020-11-26 DIAGNOSIS — I872 Venous insufficiency (chronic) (peripheral): Secondary | ICD-10-CM

## 2020-11-26 DIAGNOSIS — L84 Corns and callosities: Secondary | ICD-10-CM

## 2020-11-26 NOTE — Progress Notes (Signed)
[place] [facility]   Department of [facility]  Podiatry    Sheri Garza is a 85 y.o. female    Chief Complaint   Patient presents with    Venous Peripheral Insufficiency           Past Medical History:     Past Medical History:   Diagnosis Date    Avitaminosis D 09/14/2018    Back pain     DDD (degenerative disc disease), cervical     Dry eye     Hyperlipidemia     Insomnia     Kyphoscoliosis and scoliosis     Lumbar spinal stenosis     Osteoarthrosis     Osteoporosis     Spinal stenosis     Thyroid nodule          Past Surgical History:   Procedure Laterality Date    HX CATARACT REMOVAL Left     HX CHOLECYSTECTOMY      HX DILATION AND CURETTAGE      HX OTHER      trigger finger, repair quad    HX TONSILLECTOMY             Family History:  Family Medical History:     Problem Relation (Age of Onset)    Cancer Other, Father, Brother    Diabetes Other, Mother    HTN <20 y.o. Other    Heart Disease Mother              Social History:     Social History     Tobacco Use    Smoking status: Former    Smokeless tobacco: Never   Substance Use Topics    Alcohol use: No       Medications:    cephalexin (KEFLEX) 500 mg Oral Capsule, TAKE 4 CAPSULES BY MOUTH 1 HOUR PRIOR TO APPT (Patient not taking: Reported on 04/23/2020)    No facility-administered medications prior to visit.      Allergies:     Allergies   Allergen Reactions    Flagyl [Metronidazole]     Gentamicin     Iodine And Iodide Containing Products     Other      Antihistamines and all caines, Pt states Xylocaine is ok    Penicillins     Tetracycline     Vancomycin        Patient Active Problem List   Diagnosis    Right Knee OA    Thyroid nodule    Spinal stenosis    Hyperlipidemia    Avitaminosis D    Back pain    DDD (degenerative disc disease), cervical    Insomnia    Kyphoscoliosis and scoliosis    Lumbar spinal stenosis    Osteoarthrosis     Ht 1.575 m (5' 2")   Wt 53.5 kg (118 lb)   BMI 21.58  kg/m2    DPM Addition to HPI: Sheri Garza is a 85 y.o. female presents today as a patient who is known to me and I have seen within the past year.  She presents today with complaint of calluses on both of her feet.  She states her feet are extremely sore she points to the ball of the right foot and the heel and the ball of the left foot. Mentions some swelling in her feet today due to eating potato chips.    All other ROS Negative    Physical Exam:  Vascular exam       Dorsalis pedis pulse Right  1/4 Left   1/4   Posterior tibial pulse 1+ 1+   Capilary fill time  Less than 3 seconds Less than 3 seconds   Skin temperature cool cool   Edema 0 0   Comments:       Neurological exam      Right Left    Gross sensation intact to touch? yes yes   Paresthesia noted?  no no   Vibratory sensation? intact intact   Comments:       Dermatologic exam      Right Left    Open wounds or lesions? yes and comments Hyperkeratosis is noted plantar aspect the 2nd met head and the plantar right heel. yes and comments Area hyperkeratosis is noted plantar aspect of the 1st metatarsal head.   Ecchymosis 0 0   Erythema 0 0   Edema 0 0   Hyperpigmentation yes yes   Nail appearance Normal in appearance 1-5 Normal in appearance 1-5   Comments:       Musculoskeletal exam      Right Left    Dorsiflexion 5+ 5+   Plantar flexion 5+ 5+   Inversion 5+ 5+   Eversion 5+ 5+   Intrinsic muscle strength     Range of Motion Pain-free range of motion AJ STJ MPJs Pain-free range of motion AJ STJ MPJs   Crepitus no no   Foot type planus planus   Comments:             Assessment:    ICD-10-CM    1. Callus  L84       2. Venous insufficiency of both lower extremities  I87.2         Plan of Care:  1. Recommended vaseline to areas of callus daily.  Also discussed different forms of padding.    Follow up:  Return in about 3 months (around 02/26/2021).  I have asked Sheri Garza to follow up in 3 months or PRN if symptoms worsen or fail to improve.     I am  scribing for, and in the presence of, Dr. Clarise Cruz for services provided on 11/26/2020.  Jovita Kussmaul, SCRIBE   [place], [place]  11/26/2020, 14:37    I personally performed the services described in this documentation, as scribed  in my presence, and it is both accurate  and complete.    Clarise Cruz, [place], [place]  11/26/2020, 15:17

## 2020-12-05 ENCOUNTER — Ambulatory Visit: Payer: Medicare Other | Attending: NURSE PRACTITIONER

## 2020-12-05 ENCOUNTER — Other Ambulatory Visit (HOSPITAL_COMMUNITY): Payer: Self-pay | Admitting: NURSE PRACTITIONER

## 2020-12-05 DIAGNOSIS — M419 Scoliosis, unspecified: Secondary | ICD-10-CM

## 2020-12-28 ENCOUNTER — Ambulatory Visit (LOCAL_COMMUNITY_HEALTH_CENTER): Payer: Medicare Other

## 2020-12-28 ENCOUNTER — Other Ambulatory Visit: Payer: Self-pay

## 2020-12-28 DIAGNOSIS — Z23 Encounter for immunization: Secondary | ICD-10-CM

## 2020-12-28 NOTE — Patient Instructions (Signed)
VACCINE INFORMATION FACT SHEET FOR RECIPIENTS AND CAREGIVERS ABOUT COMIRNATY (COVID-19 VACCINE, mRNA), THE PFIZER-BIONTECH COVID-19 VACCINE, AND THE PFIZER-BIONTECH COVID-19 VACCINE, BIVALENT (ORIGINAL AND OMICRON BA.4/BA.5) TO PREVENT CORONAVIRUS DISEASE 2019 (COVID-19) FOR USE IN INDIVIDUALS 83 YEARS OF AGE AND OLDER       You are being offered either COMIRNATY (COVID-19 Vaccine, mRNA), the Pfizer-BioNTech COVID-19 Vaccine, or the Pfizer-BioNTech COVID-19 Vaccine, Bivalent (Original and Omicron BA.4/BA.5), hereafter referred to as the Pfizer-BioNTech COVID-19 Vaccine, Bivalent, to prevent Coronavirus Disease 2019 (COVID-19) caused by SARS-CoV-2.   This Vaccine Information Fact Sheet for Recipients and Caregivers comprises the Fact Sheet for the authorized Pfizer-BioNTech COVID-19 Vaccine and the Pfizer-BioNTech COVID-19 Vaccine, Bivalent, and also includes information about the U.S. Food and Drug Administration (FDA)-licensed vaccine, COMIRNATY (COVID-19 Vaccine, mRNA) for use in individuals 83 years of age and Sheri Garza.   The FDA-approved COMIRNATY (COVID-19 Vaccine, mRNA) and the Pfizer-BioNTech COVID-19 Vaccine authorized under Emergency Use Authorization (EUA) for individuals 88 years of age and older, when prepared according to their respective instructions for use, can be used interchangeably.2   COMIRNATY (COVID-19 Vaccine, mRNA) is an FDA-approved COVID-19   vaccine made by [employer] for [employer]. It is approved as a 2-dose series for   prevention of COVID-19 in individuals 24 years of age and older. It is also   authorized under EUA to provide:   .?a third primary series dose to individuals 61 years of age and older with certain kinds of immunocompromise.   1 You may receive this Vaccine Information Fact Sheet even if your child is 105 years old. Children who will turn from 11 years to 20 years of age between doses in the primary regimen may receive, for any dose in the primary regimen, either: (1) the  Pfizer-BioNTech COVID-19 Vaccine authorized for use in individuals 5 through 83 years of age; or (2) COMIRNATY (COVID-19 Vaccine, mRNA) or the Pfizer-BioNTech COVID-19 Vaccine authorized for use in individuals 23 years of age and older. 2 When prepared according to their respective instructions for use, the FDA-approved COMIRNATY (COVID-19 Vaccine, mRNA) and the EUA-authorized Pfizer-BioNTech COVID-19 Vaccine for individuals 73 years of age and older can be used interchangeably without presenting any safety or effectiveness concerns.     The Pfizer-BioNTech COVID-19 Vaccine has received EUA from FDA to   provide: .?a 2-dose primary series to individuals 69 years of age and older; and .?a third primary series dose to individuals 69 years of age and older with certain kinds of immunocompromise.   The Pfizer-BioNTech COVID-19 Vaccine, Bivalent has received EUA from FDA to provide either:   .?a single booster dose to individuals 67 years of age and older at least 2 months after completion of primary vaccination with any authorized or approved monovalent3 COVID-19 vaccine; or   .?a single booster dose to individuals 18 years of age and older at least 2 months after receipt of the most recent booster dose with any authorized or approved monovalent COVID-19 vaccine.   This Vaccine Information Fact Sheet contains information to help you understand the risks and benefits of COMIRNATY (COVID-19 Vaccine, mRNA), the Pfizer-BioNTech COVID-19 Vaccine, and the Pfizer-BioNTech COVID-19 Vaccine, Bivalent, which you may receive because there is currently a pandemic of COVID-19. Talk to your vaccination provider if you have questions.   This Fact Sheet may have been updated. For the most recent Fact Sheet, please see www.TripMetro.hu.   WHAT YOU NEED TO KNOW BEFORE YOU GET ANY OF THESE VACCINES   WHAT IS COVID-19?  COVID-19 disease is caused by a coronavirus called SARS-CoV-2. You can get COVID-19 through contact with another  person who has the virus. It is predominantly a respiratory illness that can affect other organs. People with COVID-19 have had a wide range of symptoms reported, ranging from mild symptoms to severe illness leading to death. Symptoms may appear 2 to 14 days after exposure to the virus. Symptoms may include: fever or chills; cough; shortness of breath ; fatigue; muscle or body aches; headache; new loss of taste or smell; sore throat; congestion or runny nose; nausea or vomiting; diarrhea.    3 Monovalent refers to any authorized or approved COVID-19 vaccine that contains or encodes the spike protein of only the Original SARS-CoV-2.     HOW ARE COMIRNATY (COVID-19 VACCINE, mRNA), THE PFIZER-BIONTECH COVID-19 VACCINE, AND THE PFIZER-BIONTECH COVID-19 VACCINE, BIVALENT RELATED?   COMIRNATY (COVID-19 Vaccine, mRNA) and the Pfizer-BioNTech COVID-19 Vaccine, when prepared according to their respective instructions for use, can be used interchangeably. The Pfizer-BioNTech COVID-19 Vaccine, Bivalent is made in the same way as COMIRNATY and Pfizer-BioNTech COVID-19 Vaccine but it also contains an Omicron component to help prevent COVID-19 caused by the Omicron variant of SARS-CoV-2.   For more information on EUA, see the "What is an Emergency Use Authorization (EUA)?" section at the end of this Fact Sheet.   WHAT SHOULD YOU MENTION TO YOUR VACCINATION PROVIDER BEFORE YOU GET ANY OF THESE VACCINES? Tell the vaccination provider about all of your medical conditions, including if you:  .?have any allergies   .?have had myocarditis (inflammation of the heart muscle) or pericarditis   (inflammation of the lining outside the heart)   .?have a fever   .?have a bleeding disorder or are on a blood thinner   .?are immunocompromised or are on a medicine that affects your immune system   .?are pregnant or plan to become pregnant   .?are breastfeeding   .?have received another COVID-19 vaccine   .?have ever fainted in association with an  injection   [place]?   The Pfizer-BioNTech COVID-19 Vaccine, the Pfizer-BioNTech COVID-19 Vaccine, Bivalent, or COMIRNATY (COVID-19 Vaccine, mRNA) will be given to you as an injection into the muscle.   Primary Series: The Pfizer-BioNTech COVID-19 Vaccine and COMIRNATY (COVID-19 Vaccine, mRNA) are given for the primary series. The vaccine is administered as a 2-dose series, 3 weeks apart. A third primary series dose may be administered at least 4 weeks after the second dose to individuals with certain kinds of immunocompromise.   Booster Dose: Pfizer-BioNTech COVID-19 Vaccine, Bivalent is administered as a single booster dose at least 2 months after: .?completion of primary vaccination with any authorized or approved monovalent COVID-19 vaccine; or .?receipt of the most recent booster dose with any authorized or approved monovalent COVID-19 vaccine   The vaccine may not protect everyone.   WHO SHOULD NOT GET COMIRNATY (COVID-19 VACCINE, mRNA), THE PFIZER-BIONTECH COVID-19 VACCINE, OR THE PFIZER-BIONTECH COVID-19 VACCINE, BIVALENT?   You should not get any of these vaccines if you: .?had a severe allergic reaction after a previous dose of COMIRNATY (COVID-19 Vaccine, mRNA) or the Pfizer-BioNTech COVID-19 Vaccine .?had a severe allergic reaction to any ingredient in these vaccines.   WHAT ARE THE INGREDIENTS IN THESE VACCINES?   COMIRNATY (COVID-19 Vaccine, mRNA), Pfizer-BioNTech COVID-19 Vaccine, and Pfizer-BioNTech COVID-19 Vaccine, Bivalent include the following ingredients:   .?mRNA and lipids (((4-hydroxybutyl)azanediyl)bis(hexane-6,1-diyl)bis(2  hexyldecanoate), 2 [(polyethylene glycol)-2000]-N,N-ditetradecylacetamide,   1,2-Distearoyl-sn-glycero-3-phosphocholine, and cholesterol).   Pfizer-BioNTech COVID-19 Vaccine for  individuals 81 years of age and older contains 1 of the following sets of additional ingredients; ask the vaccination provider which version is being administered:    .?potassium chloride, monobasic potassium phosphate, sodium chloride, dibasic sodium phosphate dihydrate, and sucrose OR.?tromethamine, tromethamine hydrochloride, and sucrose   Pfizer-BioNTech COVID-19 Vaccine, Bivalent for individuals 19 years of age and older contains the following additional ingredients:   .?tromethamine, tromethamine hydrochloride, and sucrose   COMIRNATY (COVID-19 Vaccine, mRNA) contains 1 of the following sets of additional ingredients; ask the vaccination provider which version is being administered: .?potassium chloride, monobasic potassium phosphate, sodium chloride, dibasic sodium phosphate dihydrate, and sucrose OR.?tromethamine, tromethamine hydrochloride, and sucrose   HAVE THESE VACCINES BEEN USED BEFORE?   In clinical trials, approximately 23,000 individuals 26 years of age and older have received at least 1 dose of Pfizer-BioNTech COVID-19 Vaccine. Millions of individuals have received the Pfizer-BioNTech COVID-19 Vaccine under EUA since December 24, 2018.   In a clinical trial, approximately 300 individuals greater than 62 years of age received one dose of a bivalent vaccine that differs from the Pfizer-BioNTech COVID-19 Vaccine, Bivalent in that it contains a different Omicron component.   WHAT ARE THE BENEFITS OF THESE VACCINES?   COMIRNATY (COVID-19 Vaccine, mRNA) and the Pfizer-BioNTech COVID-19 Vaccine have been shown to prevent COVID-19. FDA has authorized Pfizer-BioNTech .   COVID-19 Vaccine, Bivalent to provide better protection against COVID-19 caused by the Omicron variant of SARS-CoV-2.  The duration of protection against COVID-19 is currently unknown.   WHAT ARE THE RISKS OF THESE VACCINES?   There is a remote chance that these vaccines could cause a severe allergic reaction. A severe allergic reaction would usually occur within a few minutes to 1 hour after getting a dose. For this reason, your vaccination provider may ask you to stay at the place where you received  your vaccine for monitoring after vaccination. Signs of a severe allergic reaction can include:   [name]?Difficulty breathing   .?Swelling of your face and throat   .?A fast heartbeat   .?A bad rash all over your body   .?Dizziness and weakness   Myocarditis (inflammation of the heart muscle) and pericarditis (inflammation of the lining outside the heart) have occurred in some people who have received COMIRNATY (COVID-19 Vaccine, mRNA) or Pfizer-BioNTech COVID-19 Vaccine, more commonly in adolescent males and adult males under 59 years of age than among females and older males. In most of these people, symptoms began within a few days following receipt of the second dose of vaccine. The chance of having this occur is very low. You should seek medical attention right away if you have any of the following symptoms after receiving the vaccine:   [name]?Chest pain   .?Shortness of breath   .?Feelings of having a fast-beating, fluttering, or pounding heart   Side effects that have been reported with these vaccines include:   [name]?Severe allergic reactions   .?Non-severe allergic reactions such as rash, itching, hives, or swelling of the face   .?Myocarditis (inflammation of the heart muscle)   .?Pericarditis (inflammation of the lining outside the heart)   .?Injection site pain   .?Tiredness   .?Headache   .?Muscle pain   .?Chills   .?Joint pain   .?Fever   .?Injection site swelling   .?Injection site redness   .?Nausea   .?Feeling unwell   .?Swollen lymph nodes (lymphadenopathy) .?Decreased appetite   .?Diarrhea   .?Vomiting   .?Arm pain   .?Fainting in association with injection  of the vaccine   .?Dizziness   These may not be all the possible side effects of these vaccines. Serious and unexpected side effects may occur. The possible side effects of these vaccines are still being studied.   WHAT SHOULD I DO ABOUT SIDE EFFECTS?   If you experience a severe allergic reaction, call 9-1-1, or go to the nearest hospital.   Call the  vaccination provider or your healthcare provider if you have any side effects that bother you or do not go away.   Report vaccine side effects to FDA/CDC Vaccine Adverse Event Reporting System (VAERS). The VAERS toll-free number is [phone] or report online to [email or web address].https://www.[place]ashington.net/. Please include either "COMIRNATY (COVID-19 Vaccine, mRNA)", "Pfizer-BioNTech COVID-19 Vaccine EUA", or "Pfizer-BioNTech COVID-19 Vaccine, Bivalent EUA" as appropriate, in the first line   In addition, you can report side effects to [employer]. at the contact information provided below.of box #18 of the report form.      Website  Fax number  Telephone number    www.pfizersafetyreporting.com  [phone]  [phone]      You may also be given an option to enroll in v-safe. V-safe is a Administrator, sports that uses text messaging and web surveys to check in with people who have been vaccinated to identify potential side effects after COVID-19 vaccination. V-safe asks questions that help CDC monitor the safety of COVID-19 vaccines. V-safe also provides second-dose reminders if needed and live telephone follow-up by CDC if participants report a significant health impact following COVID-19 vaccination. For more information on how to sign up, visit: [email or web address].       WHAT IF I DECIDE NOT TO GET COMIRNATY (COVID-19 VACCINE, mRNA), THE PFIZER-BIONTECH COVID-19 VACCINE, OR THE PFIZER-BIONTECH COVID-19 VACCINE, BIVALENT?   Under the EUA, it is your choice to receive or not receive any of these vaccines. Should you decide not to receive any of these vaccines, it will not change your standard medical care.     ARE OTHER CHOICES AVAILABLE FOR PREVENTING COVID-19 BESIDES COMIRNATY (COVID-19 VACCINE, mRNA), THE PFIZER-BIONTECH COVID-19 VACCINE, OR THE PFIZER-BIONTECH COVID-19 VACCINE, BIVALENT?   For primary vaccination, another choice for preventing COVID-19 is SPIKEVAX (COVID-19 Vaccine, mRNA), an  FDA-approved COVID-19 vaccine. Other vaccines to prevent COVID-19 may be available under EUA, including bivalent vaccines that contain an Omicron component of SARS-CoV-2.   CAN I RECEIVE COMIRNATY (COVID-19 VACCINE, mRNA), PFIZER-BIONTECH COVID-19 VACCINE, OR THE PFIZER-BIONTECH COVID-19 VACCINE, BIVALENT AT THE SAME TIME AS OTHER VACCINES?   Data have not yet been submitted to FDA on administration of COMIRNATY (COVID-19 Vaccine, mRNA), the Pfizer-BioNTech COVID-19 Vaccine, or the Pfizer-BioNTech COVID-19 Vaccine, Bivalent at the same time with other vaccines. If you are considering receiving COMIRNATY (COVID-19 Vaccine, mRNA), the Pfizer-BioNTech COVID-19 Vaccine, or the Pfizer-BioNTech COVID-19 Vaccine, Bivalent with other vaccines, discuss your options with your healthcare provider.   WHAT IF I AM IMMUNOCOMPROMISED?   If you are immunocompromised, you may receive a third primary series dose of Pfizer-BioNTech COVID-19 Vaccine or COMIRNATY (COVID-19 Vaccine, mRNA). Individuals 60 years of age and older may receive a booster dose with Pfizer-BioNTech COVID-19 Vaccine, Bivalent. Vaccinations may not provide full immunity to COVID-19 in people who are immunocompromised, and you should continue to maintain physical precautions to help prevent COVID-19. Your close contacts should be vaccinated as appropriate.   WHAT IF I AM PREGNANT OR BREASTFEEDING?   If you are pregnant or breastfeeding, discuss your options with your healthcare provider.   WILL THESE VACCINES GIVE ME  COVID-19?   No. These vaccines do not contain SARS-CoV-2 and cannot give you COVID-19.   KEEP YOUR VACCINATION CARD   When you get your first COVID-19 vaccine, you will get a vaccination card. Remember to bring your card when you return.   ADDITIONAL INFORMATION   If you have questions, visit the website or call the telephone number provided below. To access the most recent Fact Sheets, please scan the QR code provided below.  HOW CAN I LEARN MORE?   [name]  Ask the vaccination provider.   . Visit CDC at BeginnerSteps.be.   . Visit FDA at DirectoryExclusive.com.cy  legal-regulatory-and-policy-framework/emergency-use-authorization.   Minette Brine your local or state public health department.   WHERE WILL MY VACCINATION INFORMATION BE RECORDED?   The vaccination provider may include your vaccination information in your state/local jurisdiction's Immunization Information System (IIS) or other designated system. This will ensure that you receive the same vaccine when you return for the second dose of the primary series. For more information about IISs visit:   [email or web address].   CAN I BE CHARGED AN ADMINISTRATION FEE FOR RECEIPT OF THESE COVID-19 VACCINES?   No. At this time, the provider cannot charge you for a vaccine dose and you cannot be charged an out-of-pocket vaccine administration fee or any other fee if only receiving a COVID-19 vaccination. However, vaccination providers may seek appropriate reimbursement from a program or plan that covers COVID-19 vaccine administration fees for the vaccine recipient (private insurance, Medicare, Medicaid, [place]ells [HRSA] COVID-19 Uninsured Program for non-insured recipients).   WHERE CAN I REPORT CASES OF SUSPECTED FRAUD?   Individuals becoming aware of any potential violations of the CDC COVID-19 Vaccination Program requirements are encouraged to report them to the Office of the [employer], U.S. Department of Health and [employer], at 1-800-HHS-TIPS or https://TIPS.HHS.GOV.   WHAT IS THE COUNTERMEASURES INJURY COMPENSATION PROGRAM?   The Countermeasures Injury Compensation Program (CICP) is a federal program that may help pay for costs of medical care and other specific expenses of certain people who have been seriously injured by certain medicines or vaccines, including these  vaccines. Generally, a claim must be submitted to the CICP within one (1) year from the date of receiving the vaccine. To learn more about this program, visit https://www.bennett.info/ or call 807-392-7459.   WHAT IS AN EMERGENCY USE AUTHORIZATION (EUA)?   An EUA is a mechanism to facilitate the availability and use of medical products, including vaccines, during public health emergencies, such as the current COVID-19 pandemic. An EUA is supported by a Risk manager (HHS) declaration that circumstances exist to justify the emergency use of drugs and biological products during the COVID-19 pandemic. A product authorized for emergency use has not undergone the same type of review by FDA as an FDA-approved product.   FDA may issue an EUA when certain criteria are met, which includes that there are no adequate, approved, and available alternatives. In addition, the FDA decision is based on the totality of the scientific evidence available showing that the product may be effective to prevent COVID-19 during the COVID-19 pandemic and that the known and potential benefits of the product outweigh the known and potential risks of the product. All of these criteria must be met to allow for the product to be used during the COVID-19 pandemic.   An EUA is in effect for the duration of the COVID-19 EUA declaration justifying emergency use of this product, unless terminated or  revoked (after which the product may no longer be used)

## 2020-12-28 NOTE — Nursing Note (Signed)
Administered Pfizer booster per protocol.  Verbalized understanding of possible side effects and after-care instructions. Declined to wait 15 minutes for any side effects.  Left clinic ambulatory.  Kathrene Bongo, RN  12/28/2020, 14:05

## 2020-12-28 NOTE — Progress Notes (Signed)
Immunization administered     Name Date Dose VIS Date Route    Covid-19 Vaccine,Pfizer Bivalent Booster,62yrs+ 12/28/2020 0.3 mL 12/20/2020 Intramuscular    Site: Left deltoid    Given By: Kathrene Bongo, RN    Manufacturer: [employer].    Lot: [id number]    NDC: [id number]      Kathrene Bongo, RN  12/28/2020, 14:04

## 2021-02-18 ENCOUNTER — Other Ambulatory Visit (HOSPITAL_COMMUNITY): Payer: Self-pay | Admitting: NURSE PRACTITIONER

## 2021-02-18 DIAGNOSIS — R1310 Dysphagia, unspecified: Secondary | ICD-10-CM

## 2021-02-25 ENCOUNTER — Encounter (INDEPENDENT_AMBULATORY_CARE_PROVIDER_SITE_OTHER): Payer: Self-pay | Admitting: Foot Surgery

## 2021-02-25 ENCOUNTER — Ambulatory Visit (INDEPENDENT_AMBULATORY_CARE_PROVIDER_SITE_OTHER): Payer: Medicare Other | Admitting: Foot Surgery

## 2021-02-25 ENCOUNTER — Other Ambulatory Visit: Payer: Self-pay

## 2021-02-25 VITALS — Ht 59.0 in | Wt 100.3 lb

## 2021-02-25 DIAGNOSIS — L84 Corns and callosities: Secondary | ICD-10-CM

## 2021-02-25 DIAGNOSIS — I872 Venous insufficiency (chronic) (peripheral): Secondary | ICD-10-CM

## 2021-02-25 DIAGNOSIS — M79671 Pain in right foot: Secondary | ICD-10-CM

## 2021-02-25 DIAGNOSIS — M79672 Pain in left foot: Secondary | ICD-10-CM

## 2021-02-25 NOTE — Progress Notes (Signed)
[place]   Department of [facility]  Podiatry    Sheri Garza is a 86 y.o. female    Chief Complaint   Patient presents with   . Callous           Past Medical History:     Past Medical History:   Diagnosis Date   . Avitaminosis D 09/14/2018   . Back pain    . DDD (degenerative disc disease), cervical    . Dry eye    . Hyperlipidemia    . Insomnia    . Kyphoscoliosis and scoliosis    . Lumbar spinal stenosis    . Osteoarthrosis    . Osteoporosis    . Spinal stenosis    . Thyroid nodule          Past Surgical History:   Procedure Laterality Date   . HX CATARACT REMOVAL Left    . HX CHOLECYSTECTOMY     . HX DILATION AND CURETTAGE     . HX OTHER      trigger finger, repair quad   . HX TONSILLECTOMY             Family History:  Family Medical History:     Problem Relation (Age of Onset)    Cancer Other, Father, Brother    Diabetes Other, Mother    HTN <20 y.o. Other    Heart Disease Mother              Social History:     Social History     Tobacco Use   . Smoking status: Former   . Smokeless tobacco: Never   Substance Use Topics   . Alcohol use: No       Medications:    cephalexin (KEFLEX) 500 mg Oral Capsule, TAKE 4 CAPSULES BY MOUTH 1 HOUR PRIOR TO APPT (Patient not taking: Reported on 04/23/2020)    No facility-administered medications prior to visit.      Allergies:     Allergies   Allergen Reactions   . Flagyl [Metronidazole]    . Gentamicin    . Iodine And Iodide Containing Products    . Other      Antihistamines and all caines, Pt states Xylocaine is ok   . Penicillins    . Tetracycline    . Vancomycin        Patient Active Problem List   Diagnosis   . Right Knee OA   . Thyroid nodule   . Spinal stenosis   . Hyperlipidemia   . Avitaminosis D   . Back pain   . DDD (degenerative disc disease), cervical   . Insomnia   . Kyphoscoliosis and scoliosis   . Lumbar spinal stenosis   . Osteoarthrosis     Ht 1.575 m ('5\' 2"'$ )  Wt 53.5 kg (118 lb)  BMI 21.58 kg/m2    DPM Addition to HPI: Sheri Garza is a 86 y.o. female presents today as a patient who is known to me and I have seen within the past year.  She presents today with complaint of calluses on both of her feet.  She states her feet are extremely sore she points to the ball of the right foot and the heel and the ball of the left foot. Inquires today about treatment for hammertoes.    All other ROS Negative    Physical Exam:    Vascular exam       Dorsalis pedis pulse  Right  1/4 Left   1/4   Posterior tibial pulse 1+ 1+   Capilary fill time  Less than 3 seconds Less than 3 seconds   Skin temperature cool cool   Edema 0 0   Comments:       Neurological exam      Right Left    Gross sensation intact to touch? yes yes   Paresthesia noted?  no no   Vibratory sensation? intact intact   Comments:       Dermatologic exam      Right Left    Open wounds or lesions? yes and comments Hyperkeratosis is noted plantar aspect the 2nd met head and the plantar right heel. yes and comments Area hyperkeratosis is noted plantar aspect of the 1st metatarsal head.   Ecchymosis 0 0   Erythema 0 0   Edema 0 0   Hyperpigmentation yes yes   Nail appearance Normal in appearance 1-5 Normal in appearance 1-5   Comments:       Musculoskeletal exam      Right Left    Dorsiflexion 5+ 5+   Plantar flexion 5+ 5+   Inversion 5+ 5+   Eversion 5+ 5+   Intrinsic muscle strength     Range of Motion Pain-free range of motion AJ STJ MPJs Pain-free range of motion AJ STJ MPJs   Crepitus no no   Foot type planus planus   Comments:             Assessment:    ICD-10-CM    1. Callus  L84       2. Left foot pain  M79.672       3. Venous insufficiency of both lower extremities  I87.2       4. Right foot pain  M79.671         Plan of Care:  1. Recommended vaseline to areas of callus daily.  Also discussed different forms of padding.    Follow up:  Return in about 3 months (around 05/25/2021).  I have asked Kirsty Monjaraz to follow up in 3 months or PRN if symptoms worsen or fail to improve.     I  am scribing for, and in the presence of, Dr. Clarise Cruz for services provided on 02/25/2021.  Jovita Kussmaul, SCRIBE   [place], [place]  02/25/2021, 13:11      I personally performed the services described in this documentation, as scribed  in my presence, and it is both accurate  and complete.    Clarise Cruz, [place], [place]  02/25/2021, 13:21

## 2021-02-27 ENCOUNTER — Other Ambulatory Visit: Payer: Self-pay

## 2021-02-27 ENCOUNTER — Other Ambulatory Visit (INDEPENDENT_AMBULATORY_CARE_PROVIDER_SITE_OTHER): Payer: Medicare Other

## 2021-02-27 DIAGNOSIS — E042 Nontoxic multinodular goiter: Secondary | ICD-10-CM

## 2021-02-27 DIAGNOSIS — R1310 Dysphagia, unspecified: Secondary | ICD-10-CM

## 2021-03-05 ENCOUNTER — Other Ambulatory Visit (HOSPITAL_COMMUNITY): Payer: Self-pay | Admitting: NURSE PRACTITIONER

## 2021-03-05 DIAGNOSIS — E049 Nontoxic goiter, unspecified: Secondary | ICD-10-CM

## 2021-03-07 ENCOUNTER — Other Ambulatory Visit (HOSPITAL_COMMUNITY): Payer: Self-pay | Admitting: NURSE PRACTITIONER

## 2021-03-07 ENCOUNTER — Telehealth (HOSPITAL_BASED_OUTPATIENT_CLINIC_OR_DEPARTMENT_OTHER): Payer: Self-pay | Admitting: Physician Assistant

## 2021-03-07 DIAGNOSIS — R1310 Dysphagia, unspecified: Secondary | ICD-10-CM

## 2021-03-07 NOTE — Telephone Encounter (Signed)
Called spoke to the patient, NPV was scheduled with them and an appointment letter was mailed out today.

## 2021-03-27 ENCOUNTER — Ambulatory Visit: Payer: Medicare Other | Attending: Physician Assistant | Admitting: Physician Assistant

## 2021-03-27 ENCOUNTER — Encounter (HOSPITAL_BASED_OUTPATIENT_CLINIC_OR_DEPARTMENT_OTHER): Payer: Self-pay | Admitting: Physician Assistant

## 2021-03-27 ENCOUNTER — Other Ambulatory Visit: Payer: Self-pay

## 2021-03-27 VITALS — BP 121/55 | HR 93 | Temp 97.9°F | Resp 16 | Ht 59.0 in | Wt 102.0 lb

## 2021-03-27 DIAGNOSIS — R1310 Dysphagia, unspecified: Secondary | ICD-10-CM | POA: Insufficient documentation

## 2021-03-27 NOTE — Nursing Note (Signed)
Patient here as a new patient for trouble swallowing

## 2021-03-27 NOTE — Progress Notes (Signed)
[facility] [place], [place] [address]    Date:   03/27/2021  Name: Sheri Garza  Age: 86 y.o.    Referring Provider:    Dede Query, [facility]  [place],  [place] [address]    Primary Care Provider:    [facility]    Chief Complaint: Dysphagia  Reason for GI Consult:  Dysphagia    Assessment and Plan  Sheri Garza is a 86 y.o. female who presents with Dysphagia.  Past medical history is significant for hyperlipidemia, insomnia, thyroid nodule, spinal stenosis, and osteoarthrosis.       1. Dysphagia, unspecified type      Recommendations:  Diagnostic EGD discussed for further work-up.  Patient declines at this time. She states she feels she doesn't need a procedure and reports she wanted to see ENT.  Discussed referral to ENT; patient would like to discuss with her PCP and have them place referral.  Keep scheduled follow-up with endocrinology next month.  Respect patient's wishes as noted above.    Risks/Benefits of procedure discussed with patient?  yes - EGD; pt defers.  Pt advised extra-intestinal positive ROS not discussed on this visit should be addressed with the primary service.  Follow up will be scheduled in the GI clinic as needed at this time. Advised to notify us if difficulty swallowing worsens.    ++++++++++++++++++++++++++++++++++++++++++++++++++++++++++++++++++++++++++++++++++++++++++++++++++++++++++++++++++  History of Present Illness  Sheri Garza is a very pleasant 86 year old female who presents today to establish in clinic. Patient was referred to [place] by her family doctor for the chief complaint of dysphagia.    Today, she states her trouble swallowing has gradually been getting worse. She feels some discharge in her throat, and the other day when she went to the dentist, it was terrible. She states she has to swallow pills twice sometimes to get them down. She states solid foods feel like they are scraping going down sometimes, but they  don't get stuck. She always keeps liquids close by while she is eating. She was found to have a multinodular goiter during recent thyroid ultrasound. She reports her hair is falling out more lately, and she has trouble with her nails. She is scheduled to see them on 05/08/2021. If she eats a banana every day, her bowels will move well. Denies melena or hematochezia. Denies abdominal pain, nausea, vomiting, fevers, chills, heartburn, or other complaints.    Anticoagulants (last 24 hours)       None          Past Medical History  Past Medical History:   Diagnosis Date    Avitaminosis D 09/14/2018    Back pain     DDD (degenerative disc disease), cervical     Dry eye     Hyperlipidemia     Insomnia     Kyphoscoliosis and scoliosis     Lumbar spinal stenosis     Osteoarthrosis     Osteoporosis     Spinal stenosis     Thyroid nodule      Past Surgical History:   Procedure Laterality Date    HX CATARACT REMOVAL Left     HX CHOLECYSTECTOMY      HX DILATION AND CURETTAGE      HX OTHER      trigger finger, repair quad    HX TONSILLECTOMY       Allergies   Allergen Reactions    Flagyl [Metronidazole]     Gentamicin  Iodine And Iodide Containing Products     Other      Antihistamines and all caines, Pt states Xylocaine is ok    Penicillins     Tetracycline     Vancomycin      Outpatient Medications  Current Outpatient Medications   Medication Sig    Carboxymethylcellulose-Glycern 0.5-0.9 % Ophthalmic Drops Refresh Optive 0.5-0.9% Ophthalmic Solution QTY: 0  Days: 0 Refills: 0  Written: 01/10/15 Patient Instructions:    vit C/E/Zn/coppr/lutein/zeaxan (PRESERVISION AREDS-2 ORAL) Take by mouth     Family History  Family History   Problem Relation Age of Onset    Diabetes Other     HTN <20 y.o. Other     Cancer Other     Diabetes Mother     Heart Disease Mother     Cancer Father     Cancer Brother      Social History  Social History     Tobacco Use    Smoking status: Former    Smokeless tobacco: Never   Substance Use Topics     Alcohol use: No    Drug use: No     Review of Systems  See HPI    Examination:  BP (!) 121/55   Pulse 93   Temp 36.6 C (97.9 F) (Temporal)   Resp 16   Ht 1.499 m (4\' 11" )   Wt 46.3 kg (102 lb)   SpO2 96%   BMI 20.60 kg/m     Wt Readings from Last 2 Encounters:   03/27/21 46.3 kg (102 lb)   02/25/21 45.5 kg (100 lb 4.8 oz)     General: no distress and vital signs reviewed  Eyes: Conjunctiva clear, sclera non-icteric.   HENT:Head atraumatic and normocephalic  Neck: No JVD or tracheal deviation  Lungs: Thoracic excursion  Normal  Cardiovascular:    Peripheral pulses present and equal  Abdomen: Soft, non-tender, non-distended  Extremities: No cyanosis or edema  Skin: Skin warm and dry  Neurologic: Grossly normal, Alert and oriented x3  Psychiatric: Normal    Data/Chart reviewed:  Previous labs reviewed?  yes   02/03/2020: BMP unremarkable  09/21/2019:  Vitamin-D low, BMP within normal limits, TSH within normal limits    Previous imaging reviewed?  Yes  Lower Quadrant [place] 07/12/2020:  IMPRESSION:  Normal-appearing bowel seen within the right lower quadrant. Appendix not identified. No free fluid or adenopathy in the right lower quadrant.    RUQ [place] 09/14/2018:  IMPRESSION:  1. Liver is normal in size and appearance.  2. No biliary dilatation in this patient status post remote cholecystectomy     DEXA 10/18/2013: lumbar spine with osteoporosis, left hip values consistent with osteopenia     Previous biopsy and pathology/EKG/outside records?  no   Images personally reviewed?  no  My interpretation:  N/A    I spent 14 minutes out of 24 minutes counseling her regarding the patient's diagnosis/procedure.    The above data may have been collected via chart review, discussion with other providers, review of outside records, discussion with the pts family/friends, and or obtained by the patient.  Not all of the above information was necessarily discussed with the patient.  The above stated documentation is for the  medical record and communication/coordination of care for the pts best interest.  If the patient/family has any questions about the above stated material, this can be answered at the next scheduled visit or during hospital rounds if the patient is hospitalized.  Jennette Banker, [facility]  [place], [place]  [id number]    Ted Mcalpine, DO, MS  Therapeutic Endoscopist  [facility]

## 2021-05-08 ENCOUNTER — Ambulatory Visit (INDEPENDENT_AMBULATORY_CARE_PROVIDER_SITE_OTHER): Payer: Self-pay | Admitting: Emergency Medicine

## 2021-05-27 ENCOUNTER — Other Ambulatory Visit: Payer: Self-pay

## 2021-05-27 ENCOUNTER — Encounter (INDEPENDENT_AMBULATORY_CARE_PROVIDER_SITE_OTHER): Payer: Self-pay | Admitting: Foot Surgery

## 2021-05-27 ENCOUNTER — Ambulatory Visit (INDEPENDENT_AMBULATORY_CARE_PROVIDER_SITE_OTHER): Payer: Medicare Other | Admitting: Foot Surgery

## 2021-05-27 VITALS — Ht 59.0 in | Wt 102.0 lb

## 2021-05-27 DIAGNOSIS — M79672 Pain in left foot: Secondary | ICD-10-CM

## 2021-05-27 DIAGNOSIS — L84 Corns and callosities: Secondary | ICD-10-CM

## 2021-05-27 DIAGNOSIS — I872 Venous insufficiency (chronic) (peripheral): Secondary | ICD-10-CM

## 2021-05-27 NOTE — Progress Notes (Signed)
[place]   Department of [facility]  Podiatry    Sheri Garza is a 86 y.o. female    Chief Complaint   Patient presents with   . Callous           Past Medical History:     Past Medical History:   Diagnosis Date   . Avitaminosis D 09/14/2018   . Back pain    . DDD (degenerative disc disease), cervical    . Dry eye    . Hyperlipidemia    . Insomnia    . Kyphoscoliosis and scoliosis    . Lumbar spinal stenosis    . Osteoarthrosis    . Osteoporosis    . Spinal stenosis    . Thyroid nodule          Past Surgical History:   Procedure Laterality Date   . HX CATARACT REMOVAL Left    . HX CHOLECYSTECTOMY     . HX DILATION AND CURETTAGE     . HX OTHER      trigger finger, repair quad   . HX TONSILLECTOMY             Family History:  Family Medical History:     Problem Relation (Age of Onset)    Cancer Other, Father, Brother    Diabetes Other, Mother    HTN <20 y.o. Other    Heart Disease Mother              Social History:     Social History     Tobacco Use   . Smoking status: Former   . Smokeless tobacco: Never   Substance Use Topics   . Alcohol use: No       Medications:    Carboxymethylcellulose-Glycern 0.5-0.9 % Ophthalmic Drops, Refresh Optive 0.5-0.9% Ophthalmic Solution QTY: 0  Days: 0 Refills: 0  Written: 01/10/15 Patient Instructions:  vit C/E/Zn/coppr/lutein/zeaxan (PRESERVISION AREDS-2 ORAL), Take by mouth    No facility-administered medications prior to visit.      Allergies:     Allergies   Allergen Reactions   . Flagyl [Metronidazole]    . Gentamicin    . Iodine And Iodide Containing Products    . Other      Antihistamines and all caines, Pt states Xylocaine is ok   . Penicillins    . Tetracycline    . Vancomycin        Patient Active Problem List   Diagnosis   . Right Knee OA   . Thyroid nodule   . Spinal stenosis   . Hyperlipidemia   . Avitaminosis D   . Back pain   . DDD (degenerative disc disease), cervical   . Insomnia   . Kyphoscoliosis and scoliosis   . Lumbar spinal  stenosis   . Osteoarthrosis     Ht 1.575 m ('5\' 2"'$ )  Wt 53.5 kg (118 lb)  BMI 21.58 kg/m2    DPM Addition to HPI: Sheri Garza is a 86 y.o. female presents today with complaint of calluses on both of her feet.  She states her feet are extremely sore she points to the ball of the right foot and the heel and the ball of the left foot. Patient does trim her own toenails. No other concerns at this time.    All other ROS Negative    Physical Exam:    Vascular exam       Dorsalis pedis pulse Right  1/4 Left  1/4   Posterior tibial pulse 1+ 1+   Capilary fill time  Less than 3 seconds Less than 3 seconds   Skin temperature cool cool   Edema 0 0   Comments:       Neurological exam      Right Left    Gross sensation intact to touch? yes yes   Paresthesia noted?  no no   Vibratory sensation? intact intact   Comments:       Dermatologic exam      Right Left    Open wounds or lesions? yes and comments Hyperkeratosis is noted plantar aspect the 2nd met head and the plantar right heel. yes and comments Area hyperkeratosis is noted plantar aspect of the 1st metatarsal head.   Ecchymosis 0 0   Erythema 0 0   Edema 0 0   Hyperpigmentation yes yes   Nail appearance Normal in appearance 1-5 Normal in appearance 1-5   Comments:       Musculoskeletal exam      Right Left    Dorsiflexion 5+ 5+   Plantar flexion 5+ 5+   Inversion 5+ 5+   Eversion 5+ 5+   Intrinsic muscle strength     Range of Motion Pain-free range of motion AJ STJ MPJs Pain-free range of motion AJ STJ MPJs   Crepitus no no   Foot type planus planus   Comments:             Assessment:    ICD-10-CM    1. Callus  L84       2. Left foot pain  M79.672       3. Venous insufficiency of both lower extremities  I87.2             Plan of Care:  1. Recommended vaseline to areas of callus daily.  Also discussed different forms of padding.    Follow up:  Return in about 3 months (around 08/27/2021).  I have asked Kathline Banbury to follow up in 3 months or PRN if symptoms worsen or  fail to improve.     I am scribing for, and in the presence of, Dr. Clarise Cruz for services provided on 05/27/2021.  Jovita Kussmaul, SCRIBE   [place], [place]  05/27/2021, 14:12    I personally performed the services described in this documentation, as scribed  in my presence, and it is both accurate  and complete.    Clarise Cruz, [place], DPM

## 2021-05-29 ENCOUNTER — Encounter (INDEPENDENT_AMBULATORY_CARE_PROVIDER_SITE_OTHER): Payer: Self-pay | Admitting: Foot Surgery

## 2021-06-21 ENCOUNTER — Ambulatory Visit (INDEPENDENT_AMBULATORY_CARE_PROVIDER_SITE_OTHER): Payer: Medicare Other | Admitting: Family

## 2021-08-07 ENCOUNTER — Other Ambulatory Visit (HOSPITAL_COMMUNITY): Payer: Self-pay | Admitting: Geriatric Medicine

## 2021-08-07 DIAGNOSIS — R059 Cough, unspecified: Secondary | ICD-10-CM

## 2021-08-16 ENCOUNTER — Other Ambulatory Visit: Payer: Self-pay

## 2021-08-16 ENCOUNTER — Encounter (INDEPENDENT_AMBULATORY_CARE_PROVIDER_SITE_OTHER): Payer: Self-pay | Admitting: Otolaryngology

## 2021-08-16 ENCOUNTER — Ambulatory Visit: Payer: Medicare Other | Attending: Otolaryngology | Admitting: Otolaryngology

## 2021-08-16 VITALS — Temp 98.1°F | Ht 59.0 in | Wt 101.6 lb

## 2021-08-16 DIAGNOSIS — R1313 Dysphagia, pharyngeal phase: Secondary | ICD-10-CM | POA: Insufficient documentation

## 2021-08-16 DIAGNOSIS — R059 Cough, unspecified: Secondary | ICD-10-CM | POA: Insufficient documentation

## 2021-08-16 NOTE — Progress Notes (Signed)
CC:   Cough    HPI:  Sheri Garza is a 86 y.o. female with a history of dysphagia.  She has a history of dysphagia and EGD was recommended at time of visit on 03/25/2021.  ENT evaluation was requested and she preferred not to have a procedure per the notes reviewed.  Her weight has been stable.  She does get mucous at night which awakens her.  It is clear and frothy.  She has not had any swallowing tests. She has had some voice change with age.       Past Medical History:   Diagnosis Date    Avitaminosis D 09/14/2018    Back pain     DDD (degenerative disc disease), cervical     Dry eye     Fracture of nasal bones     Hyperlipidemia     Insomnia     Kyphoscoliosis and scoliosis     Lumbar spinal stenosis     Osteoarthrosis     Osteoporosis     Spinal stenosis     Thyroid nodule            Past Surgical History:   Procedure Laterality Date    HX CATARACT REMOVAL Left     HX CHOLECYSTECTOMY      HX COLONOSCOPY      HX DILATION AND CURETTAGE      HX OTHER      trigger finger, repair quad    HX TONSILLECTOMY      HX WISDOM TEETH EXTRACTION             Current Outpatient Medications   Medication Sig    Carboxymethylcellulose-Glycern 0.5-0.9 % Ophthalmic Drops Refresh Optive 0.5-0.9% Ophthalmic Solution QTY: 0  Days: 0 Refills: 0  Written: 01/10/15 Patient Instructions:    vit C/E/Zn/coppr/lutein/zeaxan (PRESERVISION AREDS-2 ORAL) Take by mouth       Allergies   Allergen Reactions    Flagyl [Metronidazole]     Gentamicin     Iodine And Iodide Containing Products     Other      Antihistamines and all caines, Pt states Xylocaine is ok    Penicillins     Tetracycline     Vancomycin        Family Medical History:       Problem Relation (Age of Onset)    Cancer Other, Father, Brother    Diabetes Other, Mother    HTN <20 y.o. Other    Heart Disease Mother              Social History     Socioeconomic History    Marital status: Widowed     Spouse name: Not on file    Number of children: Not on file    Years of education: Not on  file    Highest education level: Not on file   Occupational History    Not on file   Tobacco Use    Smoking status: Former    Smokeless tobacco: Never   Substance and Sexual Activity    Alcohol use: No    Drug use: No    Sexual activity: Not on file   Other Topics Concern    Not on file   Social History Narrative    Not on file     Social Determinants of Health     Financial Resource Strain: Not on file   Transportation Needs: Not on file   Social Connections: Not on file  Intimate Partner Violence: Not on file   Housing Stability: Not on file       Review of Systems - Do you have any fevers: no   Any weight change: yes   Change in your vision: yes    Chest Pain: no   Shortness of Breath: no   Stomach pain: no   Urinary difficulity: no   Joint Pain: no   Skin Problems: no   Weakness or Numbness: yes   Easy Bruising or Bleeding: yes Explain Bruising or Bleeding: both Excessive Thirst: no            Physical Examination:    Temp 36.7 C (98.1 F)   Ht 1.499 m (4\' 11" )   Wt 46.1 kg (101 lb 10.1 oz)   BMI 20.53 kg/m         General:  Age appropriate female seated and in no acute distress  Neurologic:   Cranial nerves: grossly intact.   Psychiatric:   Alert and oriented x 3.  Lungs:   No apparent stridorous breathing. No acute distress.   Skin:    Skin warm and dry.   Cardiovascular:  Good perfusion of upper extremities. No cyanosis of the hands or fingers.   Head:   Atraumatic, normocephalic, no lesions  Eyes:   Palpebral fissures equal bilaterally, sclerae anicteric.  Right Ear:     External ear normally formed, canal patent.  The tympanic membrane is intact and there is no middle ear effusion.  Left Ear:  The same.   Nose:   No external lesions.  The nares are patent.  The septum is intact.  No polyps or purulence noted.   Oral Cavity:  No lesions of the gingiva, floor of mouth, tongue or palate.    Oropharynx:  No lesions of the tongue base, soft palate, or pharyngeal walls.  Hypopharynx: Mucous membranes moist.   No lesions of the piriforms or pharyngeal walls.  No pooling of secretions.  Larynx:  On mirror examination, the airway is patent.  The vocal cords are mobile and adduct to midline bilaterally.  No lesions of the epiglottis, aryepiglottic folds, false vocal cords, interaryntenoid mucosa.  Heme/Lymph:  No cervical adenopathy.   Neck:    Supple, non-tender, no lymphadenopathy.  Trachea midline.  Thyroid without masses or nodules.      Impression:    Dysphagia    Recommendations:   Exam findings were reviewed with the patient.  Discussed MBS vs BS.  She feels the bulk of her problems are in the pharyngeal region and MBS will be ordered.  Will return with results.        , MD          [facility]  [address]  [place]  [address], MD  [address]  [place],  [place]

## 2021-08-21 ENCOUNTER — Ambulatory Visit (INDEPENDENT_AMBULATORY_CARE_PROVIDER_SITE_OTHER): Payer: Medicare Other | Admitting: Family

## 2021-08-28 ENCOUNTER — Ambulatory Visit (INDEPENDENT_AMBULATORY_CARE_PROVIDER_SITE_OTHER): Payer: Medicare Other | Admitting: Foot Surgery

## 2021-08-28 ENCOUNTER — Encounter (INDEPENDENT_AMBULATORY_CARE_PROVIDER_SITE_OTHER): Payer: Self-pay | Admitting: Foot Surgery

## 2021-08-28 ENCOUNTER — Other Ambulatory Visit: Payer: Self-pay

## 2021-08-28 VITALS — Temp 98.5°F | Ht 59.0 in | Wt 101.0 lb

## 2021-08-28 DIAGNOSIS — M79672 Pain in left foot: Secondary | ICD-10-CM

## 2021-08-28 DIAGNOSIS — M79671 Pain in right foot: Secondary | ICD-10-CM

## 2021-08-28 DIAGNOSIS — I872 Venous insufficiency (chronic) (peripheral): Secondary | ICD-10-CM

## 2021-08-28 DIAGNOSIS — L84 Corns and callosities: Secondary | ICD-10-CM

## 2021-08-28 NOTE — Progress Notes (Signed)
[place]   Department of [facility]  Podiatry    Sheri Garza is a 86 y.o. female    Chief Complaint   Patient presents with    Follow Up     Callus           Past Medical History:     Past Medical History:   Diagnosis Date    Avitaminosis D 09/14/2018    Back pain     DDD (degenerative disc disease), cervical     Dry eye     Fracture of nasal bones     Hyperlipidemia     Insomnia     Kyphoscoliosis and scoliosis     Lumbar spinal stenosis     Osteoarthrosis     Osteoporosis     Spinal stenosis     Thyroid nodule          Past Surgical History:   Procedure Laterality Date    HX CATARACT REMOVAL Left     HX CHOLECYSTECTOMY      HX COLONOSCOPY      HX DILATION AND CURETTAGE      HX OTHER      trigger finger, repair quad    HX TONSILLECTOMY      HX WISDOM TEETH EXTRACTION             Family History:  Family Medical History:       Problem Relation (Age of Onset)    Cancer Other, Father, Brother    Diabetes Other, Mother    HTN <20 y.o. Other    Heart Disease Mother                Social History:     Social History     Tobacco Use    Smoking status: Former    Smokeless tobacco: Never   Substance Use Topics    Alcohol use: No       Medications:    Carboxymethylcellulose-Glycern 0.5-0.9 % Ophthalmic Drops, Refresh Optive 0.5-0.9% Ophthalmic Solution QTY: 0  Days: 0 Refills: 0  Written: 01/10/15 Patient Instructions:  cholecalciferol, vitamin D3, 250 mcg (10,000 unit) Oral Capsule, Take 1 Capsule (10,000 Units total) by mouth Once a day  vit C/E/Zn/coppr/lutein/zeaxan (PRESERVISION AREDS-2 ORAL), Take by mouth    No facility-administered medications prior to visit.      Allergies:     Allergies   Allergen Reactions    Flagyl [Metronidazole]     Gentamicin     Iodine And Iodide Containing Products     Other      Antihistamines and all caines, Pt states Xylocaine is ok    Penicillins     Tetracycline     Vancomycin        Patient Active Problem List   Diagnosis    Right Knee OA    Thyroid  nodule    Spinal stenosis    Hyperlipidemia    Avitaminosis D    Back pain    DDD (degenerative disc disease), cervical    Insomnia    Kyphoscoliosis and scoliosis    Lumbar spinal stenosis    Osteoarthrosis     Ht 1.575 m ('5\' 2"'$ )  Wt 53.5 kg (118 lb)  BMI 21.58 kg/m2    DPM Addition to HPI: Sheri Garza is a 86 y.o. female presents today with complaint of calluses on both of her feet.  She states her feet are extremely sore she points to the ball of the  right foot and the heel and the ball of the left foot. Patient does trim her own toenails. No other concerns at this time.  She feels as though callus is not as bad as usual.     All other ROS Negative    Physical Exam:    Vascular exam       Dorsalis pedis pulse Right  1/4 Left   1/4   Posterior tibial pulse 1+ 1+   Capilary fill time  Less than 3 seconds Less than 3 seconds   Skin temperature cool cool   Edema 0 0   Comments:       Neurological exam      Right Left    Gross sensation intact to touch? yes yes   Paresthesia noted?  no no   Vibratory sensation? intact intact   Comments:       Dermatologic exam      Right Left    Open wounds or lesions? yes and comments Hyperkeratosis is noted plantar aspect the 2nd met head and the plantar right heel. yes and comments Area hyperkeratosis is noted plantar aspect of the 1st metatarsal head.   Ecchymosis 0 0   Erythema 0 0   Edema 0 0   Hyperpigmentation yes yes   Nail appearance Normal in appearance 1-5 Normal in appearance 1-5   Comments:       Musculoskeletal exam      Right Left    Dorsiflexion 5+ 5+   Plantar flexion 5+ 5+   Inversion 5+ 5+   Eversion 5+ 5+   Intrinsic muscle strength     Range of Motion Pain-free range of motion AJ STJ MPJs Pain-free range of motion AJ STJ MPJs   Crepitus no no   Foot type planus planus   Comments:             Assessment:    ICD-10-CM    1. Callus  L84       2. Left foot pain  M79.672       3. Venous insufficiency of both lower extremities  I87.2       4. Right foot pain   M79.671               Plan of Care:  1. Recommended vaseline to areas of callus daily.  Also discussed different forms of padding.    Follow up:  Return in about 3 months (around 11/28/2021).  I have asked Sheri Garza to follow up in 3 months or PRN if symptoms worsen or fail to improve.     I am scribing for, and in the presence of, Dr. Clarise Cruz for services provided on 08/28/2021.  Jovita Kussmaul, SCRIBE   [place], [place]  08/28/2021, 14:55    I personally performed the services described in this documentation, as scribed  in my presence, and it is both accurate  and complete.    Clarise Cruz, [place], DPM

## 2021-09-11 ENCOUNTER — Encounter (INDEPENDENT_AMBULATORY_CARE_PROVIDER_SITE_OTHER): Payer: Self-pay | Admitting: Internal Medicine

## 2021-09-11 ENCOUNTER — Ambulatory Visit (INDEPENDENT_AMBULATORY_CARE_PROVIDER_SITE_OTHER): Payer: Medicare Other

## 2021-09-11 ENCOUNTER — Ambulatory Visit (INDEPENDENT_AMBULATORY_CARE_PROVIDER_SITE_OTHER): Payer: Medicare Other | Admitting: Internal Medicine

## 2021-09-11 ENCOUNTER — Other Ambulatory Visit: Payer: Self-pay

## 2021-09-11 VITALS — BP 122/70 | HR 86 | Ht 59.02 in | Wt 105.0 lb

## 2021-09-11 DIAGNOSIS — M810 Age-related osteoporosis without current pathological fracture: Secondary | ICD-10-CM

## 2021-09-11 DIAGNOSIS — M40209 Unspecified kyphosis, site unspecified: Secondary | ICD-10-CM

## 2021-09-11 DIAGNOSIS — E049 Nontoxic goiter, unspecified: Secondary | ICD-10-CM

## 2021-09-11 DIAGNOSIS — R1310 Dysphagia, unspecified: Secondary | ICD-10-CM

## 2021-09-11 DIAGNOSIS — E042 Nontoxic multinodular goiter: Secondary | ICD-10-CM

## 2021-09-11 NOTE — Progress Notes (Signed)
Endocrinology Consultation  [facility]  Operated by [facility]     Date:   09/11/2021  Name: Sheri Garza  Age: 86 y.o.    History of Present Illness:    Sheri Garza is a 86 y.o. year old female who presents in consultation at the request of Spring-Plotner, Melissa* for multiple thyroid nodules; has had for many years. She has a history of osteoporosis and it sounds like received zoledronate at some point. No history of atraumatic fractures.     Past Medical History:   Diagnosis Date    Back pain     DDD (degenerative disc disease), cervical     Dry eye     Fracture of nasal bones     Hypertriglyceridemia     Insomnia     Kyphoscoliosis and scoliosis     Lumbar spinal stenosis     Osteoarthrosis     Osteoporosis     Spinal stenosis     Thyroid nodule     Vitamin D deficiency          Past Surgical History:   Procedure Laterality Date    HX CATARACT REMOVAL Left     HX CHOLECYSTECTOMY      HX COLONOSCOPY      HX DILATION AND CURETTAGE      HX OTHER      trigger finger, repair quad    HX TONSILLECTOMY      HX WISDOM TEETH EXTRACTION           Family Medical History:       Problem Relation (Age of Onset)    Cancer Other, Father, Brother    Diabetes Other, Mother    HTN <20 y.o. Other    Heart Disease Mother            Social History     Tobacco Use    Smoking status: Former    Smokeless tobacco: Never   Substance Use Topics    Alcohol use: No      Allergies   Allergen Reactions    Flagyl [Metronidazole]     Gentamicin     Iodine And Iodide Containing Products     Other      Antihistamines and all caines, Pt states Xylocaine is ok    Penicillins     Tetracycline     Vancomycin      No current outpatient medications on file.       Review of Systems: 10 systems reviewed and negative except for what is stated in HPI.    Examination:  BP 122/70   Pulse 86   Ht 1.499 m (4' 11.02")   Wt 47.6 kg (105 lb)   SpO2 99%   BMI 21.20 kg/m   General: thin, comfortable, no distress  Eyes: normal conjunctiva,  PERRLA, EOMI  Mouth: moist mucous membranes  Neck: thyroid normal size, no palpable nodularity, no JVD  Lungs: clear to auscultation, no wheezes or crackles  Heart: regular rate, no murmur, rub, or gallop  Abdomen: soft, non-tender, no striae  Neuro: no focal deficits, normal extremity movement  MSK: normal gait, significant kyphosis  Skin: +LE edema, no rash, no ecchymoses    Labs:    Imaging:  02/27/21 Thyroid ultrasound  Isthmus:     The thyroid isthmus measures 1.5 mm in its AP dimension. There are several small hypoechoic nodules within the isthmus, the largest to the left of midline measuring 0.4 x 0.2 x 0.5 cm.  Right lobe:     The right thyroid lobe measures 5.2 x 2.1 x 2.2 cm. There is heterogeneity of the right lobe due to multiple small partially cystic and partially solid nodules. In total, 11 nodules were measured in the right lobe with the majority measuring less than 1 cm in their greatest dimension. Several of the larger nodules include a largely cystic mid nodule measuring 0.7 x 0.5 x 0.9 cm, mixed solid and cystic posterior mid nodule measuring 0.9 x 0.5 x 0.9 cm, posterior mid to low partially solid nodule measuring 0.9 x 0.5 x 1.1 cm, and 0.9 x 0.5 x 0.9 cm anterior cystic and solid right lower nodule.     Left lobe:     The left thyroid lobe measures 3.9 x 1.4 x 1.9 cm. There is heterogeneity of the left lobe due to multiple nodules. The largest of these left-sided nodules constitute 2 adjacent nodules within the mid left thyroid which are a solid and cystic nodule measuring 1.1 x 1.0 x 1.2 cm and solid and cystic nodule measuring 1.1 x 0.8 x 1.5 cm.    Assessment and Plan:  85yo healthy woman with osteoporosis and dysphagia presents with thyroid nodules.    #Thyroid nodules: there are no features suspicious for cancer on the ultrasound. No FNA is needed. Even if thyroid cancer were present, it is VERY unlikely to be the cause of death since thyroid cancer is generally very slow growing if the  patient is diagnosed in their 6's. FNA and thyroidectomy would do more harm than good.  --No FNA necessary  --No further thyroid ultrasounds are necessary  --Even if the nodules were contributing to dysphagia, I am not sure that putting her through a thyroidectomy is in her best interest  --Should do TSH at some point to ensure no toxic nodules    #Dysphagia: this is the most concerning symptom to her. She has been referred to GI and ENT, but what she really needs is a Pharmacist, community and likely a barium swallow; referral placed.     #Osteoporosis: kyphosis is significant; has tried some osteoporosis therapy in the past and did not tolerate it. I think she would be a good Prolia candidate.   --Check DXA now    Follow-up: 1-2 months     Lupita Leash, MD

## 2021-09-23 ENCOUNTER — Ambulatory Visit (HOSPITAL_COMMUNITY): Payer: Self-pay

## 2021-09-26 ENCOUNTER — Inpatient Hospital Stay
Admission: RE | Admit: 2021-09-26 | Discharge: 2021-09-26 | Disposition: A | Discharge status: Home / Self Care | Payer: Medicare Other | Source: Ambulatory Visit | Attending: Internal Medicine | Admitting: Internal Medicine

## 2021-09-26 ENCOUNTER — Other Ambulatory Visit (INDEPENDENT_AMBULATORY_CARE_PROVIDER_SITE_OTHER): Payer: Self-pay | Admitting: Internal Medicine

## 2021-09-26 ENCOUNTER — Other Ambulatory Visit: Payer: Self-pay

## 2021-09-26 DIAGNOSIS — M810 Age-related osteoporosis without current pathological fracture: Secondary | ICD-10-CM | POA: Insufficient documentation

## 2021-09-26 DIAGNOSIS — E049 Nontoxic goiter, unspecified: Secondary | ICD-10-CM

## 2021-09-26 DIAGNOSIS — R1310 Dysphagia, unspecified: Secondary | ICD-10-CM

## 2021-09-27 ENCOUNTER — Encounter (INDEPENDENT_AMBULATORY_CARE_PROVIDER_SITE_OTHER): Payer: Self-pay | Admitting: Internal Medicine

## 2021-10-07 ENCOUNTER — Ambulatory Visit (INDEPENDENT_AMBULATORY_CARE_PROVIDER_SITE_OTHER): Payer: Self-pay | Admitting: Internal Medicine

## 2021-10-07 DIAGNOSIS — M810 Age-related osteoporosis without current pathological fracture: Secondary | ICD-10-CM

## 2021-10-07 NOTE — Telephone Encounter (Signed)
Patient left a message stating that since she had the dexa she has been in pain and would like to consider treatment.    phone [phone]

## 2021-10-08 NOTE — Telephone Encounter (Signed)
Patient states she must speak directly to you about how she is feeling since she had the Dexa. She does not want to schedule prolia until after speaking with you .

## 2021-10-08 NOTE — Telephone Encounter (Signed)
Please arrange for Prolia and explain to the patient that she would take this shot every 6 months.   Needs labs prior. If at [facility], then it will be in order set. If outside [facility], needs calcium, creatinine, albumin, vitamin D. Ordered.

## 2021-11-27 ENCOUNTER — Encounter (INDEPENDENT_AMBULATORY_CARE_PROVIDER_SITE_OTHER): Payer: Self-pay | Admitting: Foot Surgery

## 2021-12-04 ENCOUNTER — Encounter (INDEPENDENT_AMBULATORY_CARE_PROVIDER_SITE_OTHER): Payer: Medicare Other | Admitting: Foot Surgery

## 2021-12-16 ENCOUNTER — Other Ambulatory Visit: Payer: Self-pay

## 2021-12-16 ENCOUNTER — Other Ambulatory Visit (HOSPITAL_COMMUNITY): Payer: Self-pay | Admitting: Geriatric Medicine

## 2021-12-16 ENCOUNTER — Encounter (INDEPENDENT_AMBULATORY_CARE_PROVIDER_SITE_OTHER): Payer: Self-pay | Admitting: Foot Surgery

## 2021-12-16 ENCOUNTER — Ambulatory Visit (INDEPENDENT_AMBULATORY_CARE_PROVIDER_SITE_OTHER): Payer: Medicare Other | Admitting: Foot Surgery

## 2021-12-16 DIAGNOSIS — M79672 Pain in left foot: Secondary | ICD-10-CM

## 2021-12-16 DIAGNOSIS — L84 Corns and callosities: Secondary | ICD-10-CM

## 2021-12-16 DIAGNOSIS — M79671 Pain in right foot: Secondary | ICD-10-CM

## 2021-12-16 DIAGNOSIS — J328 Other chronic sinusitis: Secondary | ICD-10-CM

## 2021-12-16 DIAGNOSIS — I872 Venous insufficiency (chronic) (peripheral): Secondary | ICD-10-CM

## 2021-12-16 NOTE — Progress Notes (Signed)
[facility]   Department of Orthopaedics  [facility]    Sheri Garza is a 86 y.o. female    Chief Complaint   Patient presents with    Follow Up           Past Medical History:     Past Medical History:   Diagnosis Date    Back pain     DDD (degenerative disc disease), cervical     Dry eye     Fracture of nasal bones     Hypertriglyceridemia     Insomnia     Kyphoscoliosis and scoliosis     Lumbar spinal stenosis     Osteoarthrosis     Osteoporosis     Spinal stenosis     Thyroid nodule     Vitamin D deficiency          Past Surgical History:   Procedure Laterality Date    HX CATARACT REMOVAL Left     HX CHOLECYSTECTOMY      HX COLONOSCOPY      HX DILATION AND CURETTAGE      HX OTHER      trigger finger, repair quad    HX TONSILLECTOMY      HX WISDOM TEETH EXTRACTION             Family History:  Family Medical History:       Problem Relation (Age of Onset)    Cancer Other, Father, Brother    Diabetes Other, Mother    HTN <20 y.o. Other    Heart Disease Mother                Social History:     Social History     Tobacco Use    Smoking status: Former    Smokeless tobacco: Never   Substance Use Topics    Alcohol use: No       Medications:    No outpatient medications prior to visit.  No facility-administered medications prior to visit.      Allergies:     Allergies   Allergen Reactions    Flagyl [Metronidazole]     Gentamicin     Iodine And Iodide Containing Products     Other      Antihistamines and all caines, Pt states Xylocaine is ok    Penicillins     Tetracycline     Vancomycin        Patient Active Problem List   Diagnosis    Right Knee OA    Thyroid nodule    Spinal stenosis    Hyperlipidemia    Avitaminosis D    Back pain    DDD (degenerative disc disease), cervical    Insomnia    Kyphoscoliosis and scoliosis    Lumbar spinal stenosis    Osteoarthrosis     Ht 1.575 m (_0 )  Wt 53.5 kg (118 lb)  BMI 21.58 kg/m2    DPM Addition to HPI: Sheri Garza is a 86 y.o. female presents  today with complaint of calluses on both of her feet. She points to the ball of the right foot and the heel and the ball of the left foot. Patient does trim her own toenails. No other concerns at this time. Mentions today she usually wears support hose but at times they are difficult to put on.     All other ROS Negative    Physical Exam:    Vascular exam  Dorsalis pedis pulse Right  1/4 Left   1/4   Posterior tibial pulse 1+ 1+   Capilary fill time  Less than 3 seconds Less than 3 seconds   Skin temperature cool cool   Edema 0 0   Comments:       Neurological exam      Right Left    Gross sensation intact to touch? yes yes   Paresthesia noted?  no no   Vibratory sensation? intact intact   Comments:       Dermatologic exam      Right Left    Open wounds or lesions? yes and comments Hyperkeratosis is noted plantar aspect the 2nd met head and the plantar right heel. yes and comments Area hyperkeratosis is noted plantar aspect of the 1st metatarsal head.   Ecchymosis 0 0   Erythema 0 0   Edema 0 0   Hyperpigmentation yes yes   Nail appearance Normal in appearance 1-5 Normal in appearance 1-5   Comments:       Musculoskeletal exam      Right Left    Dorsiflexion 5+ 5+   Plantar flexion 5+ 5+   Inversion 5+ 5+   Eversion 5+ 5+   Intrinsic muscle strength     Range of Motion Pain-free range of motion AJ STJ MPJs Pain-free range of motion AJ STJ MPJs   Crepitus no no   Foot type planus planus   Comments:             Assessment:    ICD-10-CM    1. Callus  L84       2. Left foot pain  M79.672       3. Venous insufficiency of both lower extremities  I87.2       4. Right foot pain  M79.671               Plan of Care:  1. Recommended vaseline to areas of callus daily.  Also discussed different forms of padding.    Follow up:  Return in about 3 months (around 03/17/2022).  I have asked [name] to follow up in 3 months or PRN if symptoms worsen or fail to improve.     I am scribing for, and in the presence of, Dr. Clarise Cruz for services provided on 12/16/2021.  Jovita Kussmaul, SCRIBE   [place], [place]  12/16/2021, 13:16      I personally performed the services described in this documentation, as scribed  in my presence, and it is both accurate  and complete.    Clarise Cruz, [place], DPM

## 2021-12-18 ENCOUNTER — Telehealth (INDEPENDENT_AMBULATORY_CARE_PROVIDER_SITE_OTHER): Payer: Self-pay | Admitting: Internal Medicine

## 2022-01-08 NOTE — Procedures (Signed)
[facility]  [place]  [place] [id number]    Procedure Note    Name: Sheri Garza MRN:  [id number]   Date: 12/16/2021 Age: 86 y.o.  DOB:   10-04-1935       See progress note  Clarise Cruz, DPM

## 2022-01-16 ENCOUNTER — Other Ambulatory Visit: Payer: Self-pay

## 2022-01-16 ENCOUNTER — Ambulatory Visit (INDEPENDENT_AMBULATORY_CARE_PROVIDER_SITE_OTHER): Payer: Medicare Other | Admitting: OTOLARYNGOLOGY

## 2022-01-16 VITALS — Temp 99.0°F | Ht 59.0 in | Wt 98.1 lb

## 2022-01-16 DIAGNOSIS — J328 Other chronic sinusitis: Secondary | ICD-10-CM

## 2022-01-16 DIAGNOSIS — R1313 Dysphagia, pharyngeal phase: Secondary | ICD-10-CM

## 2022-01-16 NOTE — Progress Notes (Signed)
Temp 37.2 C (99 F) (Thermal Scan)   Ht 1.499 m (4\' 11" )   Wt 44.5 kg (98 lb 1.7 oz)   BMI 19.81 kg/m  SUBJECTIVE:  The patient is an 87 year old with mild pharyngeal dysphagia.  She feels like food sticks.  She also feels like her saliva sticks and she has to spit it out, especially at night.  She does have heartburn.  She can tolerate most things.  She does not struggle with swallowing liquids.    OBJECTIVE:  On examination today no neck masses.  No nasal lesions.  No nasal masses.  Moist oral mucosa.  Mirror exam of larynx shows normal mobile vocal cords, no pooling of secretions.    IMPRESSION:  Dysphagia.    PLAN:  Modified barium swallow.

## 2022-01-29 ENCOUNTER — Ambulatory Visit (HOSPITAL_COMMUNITY): Payer: Self-pay | Admitting: Radiology

## 2022-03-03 ENCOUNTER — Inpatient Hospital Stay
Admission: AD | Admit: 2022-03-03 | Discharge: 2022-03-07 | DRG: 481 | Disposition: A | Discharge status: Rehabilitation Facility | Payer: Medicare Other | Source: Other Acute Inpatient Hospital | Attending: Surgery | Admitting: Surgery

## 2022-03-03 ENCOUNTER — Emergency Department
Admission: EM | Admit: 2022-03-03 | Discharge: 2022-03-03 | Disposition: A | Discharge status: Other Acute Inpatient Hospital | Payer: Medicare Other | Attending: Acute Care | Admitting: Acute Care

## 2022-03-03 ENCOUNTER — Encounter (HOSPITAL_COMMUNITY): Payer: Self-pay

## 2022-03-03 ENCOUNTER — Other Ambulatory Visit: Payer: Self-pay

## 2022-03-03 ENCOUNTER — Emergency Department (HOSPITAL_COMMUNITY): Payer: Medicare Other

## 2022-03-03 ENCOUNTER — Emergency Department (EMERGENCY_DEPARTMENT_HOSPITAL): Payer: Medicare Other | Admitting: Radiology

## 2022-03-03 ENCOUNTER — Inpatient Hospital Stay (HOSPITAL_COMMUNITY): Payer: Medicare Other | Admitting: Student in an Organized Health Care Education/Training Program

## 2022-03-03 DIAGNOSIS — S72142A Displaced intertrochanteric fracture of left femur, initial encounter for closed fracture: Secondary | ICD-10-CM | POA: Insufficient documentation

## 2022-03-03 DIAGNOSIS — W19XXXA Unspecified fall, initial encounter: Secondary | ICD-10-CM | POA: Insufficient documentation

## 2022-03-03 DIAGNOSIS — M3501 Sicca syndrome with keratoconjunctivitis: Secondary | ICD-10-CM | POA: Diagnosis present

## 2022-03-03 DIAGNOSIS — S72002A Fracture of unspecified part of neck of left femur, initial encounter for closed fracture: Secondary | ICD-10-CM | POA: Diagnosis present

## 2022-03-03 DIAGNOSIS — S72002D Fracture of unspecified part of neck of left femur, subsequent encounter for closed fracture with routine healing: Secondary | ICD-10-CM | POA: Diagnosis present

## 2022-03-03 DIAGNOSIS — Z79899 Other long term (current) drug therapy: Secondary | ICD-10-CM

## 2022-03-03 DIAGNOSIS — Z888 Allergy status to other drugs, medicaments and biological substances status: Secondary | ICD-10-CM

## 2022-03-03 DIAGNOSIS — S8992XA Unspecified injury of left lower leg, initial encounter: Secondary | ICD-10-CM

## 2022-03-03 DIAGNOSIS — Z880 Allergy status to penicillin: Secondary | ICD-10-CM

## 2022-03-03 DIAGNOSIS — M810 Age-related osteoporosis without current pathological fracture: Secondary | ICD-10-CM | POA: Diagnosis present

## 2022-03-03 DIAGNOSIS — W1830XA Fall on same level, unspecified, initial encounter: Secondary | ICD-10-CM | POA: Diagnosis present

## 2022-03-03 DIAGNOSIS — E785 Hyperlipidemia, unspecified: Secondary | ICD-10-CM

## 2022-03-03 DIAGNOSIS — D62 Acute posthemorrhagic anemia: Secondary | ICD-10-CM | POA: Diagnosis not present

## 2022-03-03 DIAGNOSIS — X58XXXA Exposure to other specified factors, initial encounter: Secondary | ICD-10-CM

## 2022-03-03 DIAGNOSIS — Z881 Allergy status to other antibiotic agents status: Secondary | ICD-10-CM

## 2022-03-03 DIAGNOSIS — Z87891 Personal history of nicotine dependence: Secondary | ICD-10-CM

## 2022-03-03 DIAGNOSIS — Z8249 Family history of ischemic heart disease and other diseases of the circulatory system: Secondary | ICD-10-CM

## 2022-03-03 DIAGNOSIS — W182XXA Fall in (into) shower or empty bathtub, initial encounter: Secondary | ICD-10-CM

## 2022-03-03 LAB — BASIC METABOLIC PANEL
ANION GAP: 8 mmol/L (ref 4–13)
BUN/CREA RATIO: 18 (ref 6–22)
BUN: 12 mg/dL (ref 8–25)
CALCIUM: 9.3 mg/dL (ref 8.5–10.2)
CHLORIDE: 106 mmol/L (ref 96–111)
CO2 TOTAL: 29 mmol/L (ref 22–32)
CREATININE: 0.68 mg/dL (ref 0.49–1.10)
ESTIMATED GFR: >60 mL/min/{1.73_m2} (ref >60)
GLUCOSE: 104 mg/dL (ref 65–125)
POTASSIUM: 3.9 mmol/L (ref 3.5–5.1)
SODIUM: 143 mmol/L (ref 136–145)

## 2022-03-03 LAB — CBC WITH DIFF
BASOPHIL #: <0.04 10*3/uL (ref <=0.20)
BASOPHIL %: 0.3 %
EOSINOPHIL #: <0.04 10*3/uL (ref <=0.50)
EOSINOPHIL %: 0.4 %
HCT: 44.8 % (ref 34.8–46.0)
HGB: 14.1 g/dL (ref 11.5–16.0)
IMMATURE GRANULOCYTE #: <0.04 10*3/uL (ref <0.10)
IMMATURE GRANULOCYTE %: 0.1 % (ref 0.0–1.0)
LYMPHOCYTE #: 0.96 10*3/uL — ABNORMAL LOW (ref 1.00–4.80)
LYMPHOCYTE %: 13.1 %
MCH: 27.6 pg (ref 26.0–32.0)
MCHC: 31.5 g/dL (ref 31.0–35.5)
MCV: 87.7 fL (ref 78.0–100.0)
MONOCYTE #: 0.4 10*3/uL (ref 0.20–1.10)
MONOCYTE %: 5.5 %
MPV: 10.1 fL (ref 8.7–12.5)
NEUTROPHIL #: 5.91 10*3/uL (ref 1.50–7.70)
NEUTROPHIL %: 80.6 %
PLATELETS: 201 10*3/uL (ref 150–400)
RBC: 5.11 10*6/uL (ref 3.85–5.22)
RDW-CV: 12.5 % (ref 11.5–15.5)
WBC: 7.3 10*3/uL (ref 3.7–11.0)

## 2022-03-03 LAB — TYPE AND SCREEN
ABO/RH(D): B NEG
ANTIBODY SCREEN: NEGATIVE

## 2022-03-03 LAB — DARK GREEN TUBE

## 2022-03-03 MED ORDER — SODIUM CHLORIDE 0.9 % (FLUSH) INJECTION SYRINGE
3.0000 mL | INJECTION | INTRAMUSCULAR | Status: DC | PRN
Start: 2022-03-03 — End: 2022-03-06
  Administered 2022-03-04: 3 mL

## 2022-03-03 MED ORDER — SODIUM CHLORIDE 0.9 % (FLUSH) INJECTION SYRINGE
3.0000 mL | INJECTION | Freq: Three times a day (TID) | INTRAMUSCULAR | Status: DC
Start: 2022-03-03 — End: 2022-03-06
  Administered 2022-03-03 – 2022-03-04 (×2): 3 mL
  Administered 2022-03-04 (×2): 0 mL
  Administered 2022-03-05 – 2022-03-06 (×4): 3 mL
  Administered 2022-03-06: 0 mL

## 2022-03-03 NOTE — ED Nurses Note (Signed)
External female catheter in place for urine collection.  Patient changed into a hospital gown and warm blankets applied.     Tacey Ruiz, RN

## 2022-03-03 NOTE — ED Triage Notes (Addendum)
Patient arrives via EMS after a fall from standing. Patient states she slipped an fell, landing on her L hip, which is now painful. Patient denies hitting her head.

## 2022-03-03 NOTE — ED Nurses Note (Signed)
Report called to [place] on 5S    Tacey Ruiz, RN

## 2022-03-03 NOTE — H&P (Signed)
Sheri Garza  Hospitalist  Admission H&P    Date of Service:  03/04/2022  Sheri Garza, Sheri Garza, 87 y.o. female  Date of Admission:  03/03/2022  Date of Birth:  [birth year]  PCP: [facility] Obtained from: patient  Chief Complaint:  Fall, Left Hip Fracture    HPI: Sheri Garza is a 87 y.o., White female  with osteoporosis, hyperlipidemia, macular degeneration presented to [facility] on 0 03/03/22 from [facility] with fall and left hip fracture.      Patient reports that she was taking her grocery cart from the grocery store when it got stuck and tilted to 1 side and she was grabbing onto the cart and she went with the cart and fell on her left side.  She was assisted by the people in the parking lot.  She denies having any headache, dizziness, lightheadedness, chest pain, shortness for breath prior to this and it was mostly mechanical fall.  She has a history of osteoporosis but has not been able to tolerate Prolia.  She reports she is never taken any pain medications as well including Tylenol.  She denies having any other symptoms.        PAST MEDICAL:    Past Medical History:   Diagnosis Date    Back pain     DDD (degenerative disc disease), cervical     Dry eye     Fracture of nasal bones     Hypertriglyceridemia     Insomnia     Kyphoscoliosis and scoliosis     Lumbar spinal stenosis     Osteoarthrosis     Osteoporosis     Spinal stenosis     Thyroid nodule     Vitamin D deficiency         Past Surgical History:   Procedure Laterality Date    HX CATARACT REMOVAL Left     HX CHOLECYSTECTOMY      HX COLONOSCOPY      HX DILATION AND CURETTAGE      HX OTHER      trigger finger, repair quad    HX TONSILLECTOMY      HX WISDOM TEETH EXTRACTION              Allergies   Allergen Reactions    Antihistamine [Diphenhydramine Hcl] Swelling    Penicillins      Rash, Dizziness    Vancomycin  Other Adverse Reaction (Add comment)     Phlebitis    Flagyl  [Metronidazole]     Gentamicin     Iodine And Iodide Containing Products     Lidocaine      Except xylocaine. Ok taking that    Other      Antihistamines and all caines, Pt states Xylocaine is ok    Tetracycline          Family History  Family Medical History:       Problem Relation (Age of Onset)    Cancer Other, Father, Brother    Diabetes Other, Mother    HTN <20 y.o. Other    Heart Disease Mother             Social History  Social History     Tobacco Use    Smoking status: Former    Smokeless tobacco: Never   Substance Use Topics    Alcohol use: No  ROS: Other than ROS in the HPI, all other systems were negative.  All other systems reviewed and negative other than history of present illness.    Examination:  Temperature: 36.8 C (98.2 F) Heart Rate: (!) 103 BP (Non-Invasive): (!) 141/77   Respiratory Rate: 18 SpO2: 97 %          Labs:    CBC (Last 24 Hours):    Recent Results last 24 hours     03/03/22  1812   WBC 7.3   HGB 14.1   HCT 44.8   MCV 87.7   PLTCNT 201         Basic Metabolic Profile    Lab Results   Component Value Date/Time    SODIUM 143 03/03/2022 06:12 PM    POTASSIUM 3.9 03/03/2022 06:12 PM    CHLORIDE 106 03/03/2022 06:12 PM    CO2 29 03/03/2022 06:12 PM    ANIONGAP 8 03/03/2022 06:12 PM    Lab Results   Component Value Date/Time    BUN 12 03/03/2022 06:12 PM    CREATININE 0.68 03/03/2022 06:12 PM    GLUCOSENF 104 03/03/2022 06:12 PM            Hepatic Function    Lab Results   Component Value Date/Time    ALBUMIN 3.6 09/21/2019 12:00 PM    TOTALPROTEIN 6.8 09/21/2019 12:00 PM    ALKPHOS 122 09/21/2019 12:00 PM    Lab Results   Component Value Date/Time    AST 24 09/21/2019 12:00 PM    ALT 12 09/21/2019 12:00 PM                    Imaging Studies:   No orders to display         DNR Status:  Full Code    Assessment/Plan:   Active [facility] Problems    Diagnosis    Primary Problem: Closed left hip fracture, initial encounter (CMS [place])        -----------------------------------------    Exam:  Physical Exam  Constitutional:       General: She is not in acute distress.     Appearance: Normal appearance. She is not ill-appearing.   HENT:      Head: Normocephalic and atraumatic.      Mouth/Throat:      Mouth: Mucous membranes are moist.      Pharynx: Oropharynx is clear.   Eyes:      General: No scleral icterus.     Conjunctiva/sclera: Conjunctivae normal.   Cardiovascular:      Rate and Rhythm: Normal rate and regular rhythm.      Pulses: Normal pulses.      Heart sounds: Normal heart sounds.   Pulmonary:      Effort: Pulmonary effort is normal. No respiratory distress.      Breath sounds: Normal breath sounds.   Abdominal:      General: There is no distension.      Palpations: Abdomen is soft.      Tenderness: There is no abdominal tenderness.   Musculoskeletal:      Right lower leg: No edema.      Left lower leg: No edema.   Skin:     General: Skin is warm and dry.   Neurological:      General: No focal deficit present.      Mental Status: She is alert and oriented to person, place, and time. Mental status is at baseline.  Psychiatric:         Mood and Affect: Mood normal.         Behavior: Behavior normal.          -----------------------------------------    Problem List:    Mechanical fall  Left hip intertrochanteric fracture:   Orthopedic on board, plan for OR tomorrow   We will make patient NPO after midnight   Patient currently remains pain-free  We will be careful ordering pain medications since she indicates she has a lot of allergies has never taken any pain medication including Tylenol or NSAIDs  SCD for DVT prophylaxis      Osteoporosis:   Was not able to tolerate Prolia in the past    Macular degeneration:   Keratoconjunctivitis sicca:   On AERDS2 multivitamin daily currently not available in formulary  Only uses preservative-free ocular lubricant due to allergy which is not available in the  pharmacy        -----------------------------------------    Home Meds:   Cannot display prior to admission medications because the patient has not been admitted in this contact.           -----------------------------------------  Checklist:  CODE:  Full code    Activity:  Bedrest  Diet: MNT PROTOCOL FOR DIETITIAN  DIET NPO - SPECIFIC DATE & TIME   DVT Prophylaxis:  SCD  -----------------------------------------    Warnell Bureau MD  Internal Medicine  [facility]

## 2022-03-03 NOTE — ED Nurses Note (Addendum)
Per Darol Destine, ok to eat now but NPO at midnight. Hortense Ramal, RN

## 2022-03-03 NOTE — ED Nurses Note (Signed)
[facility] BLS called in at [facility].      Patient has bed at [place], RN

## 2022-03-03 NOTE — ED Notes (Signed)
Pt given box lunch and bottled water

## 2022-03-03 NOTE — ED Nurses Note (Signed)
Lorie RN gave report to EMS, patient has now left the department, via cot, with 2 person assist to transfer. Hortense Ramal, RN

## 2022-03-03 NOTE — ED Provider Notes (Signed)
[name] Emergency Department  ED Primary Provider Note  History of Present Illness   Chief Complaint   Patient presents with    Fall     Sheri Garza is a 87 y.o. female who had concerns including Fall.  Arrival: The patient arrived by Ambulance    Pt presents s/p mechanical fall. Pt was at [facility] and while attempting to push her cart to her car, the cart started to tip over and she fell with it. Landed directly on her L hip. Denies LOC or hitting head. No numbness/tingling of groin. No additional complaints noted at this time.       History Reviewed This Encounter: Medical History  Surgical History  Family History  Social History    Physical Exam   ED Triage Vitals [03/03/22 1653]   BP (Non-Invasive) 134/67   Heart Rate 100   Respiratory Rate 18   Temperature 36.6 C (97.9 F)   SpO2 95 %   Weight 44.5 kg (98 lb)   Height 1.499 m (4' 11"$ )     Filed Vitals:    03/03/22 1653 03/03/22 1912 03/03/22 1930   BP: 134/67 107/70 (!) 152/83   Pulse: 100 97    Resp: 18 18    Temp: 36.6 C (97.9 F)     SpO2: 95% 96% 98%       Physical Exam  Vitals and nursing note reviewed.   Constitutional:       General: She is not in acute distress.     Appearance: Normal appearance. She is well-developed and normal weight.   HENT:      Head: Normocephalic and atraumatic.   Eyes:      Conjunctiva/sclera: Conjunctivae normal.   Cardiovascular:      Rate and Rhythm: Normal rate and regular rhythm.      Pulses: Normal pulses.      Heart sounds: Normal heart sounds. No murmur heard.  Pulmonary:      Effort: Pulmonary effort is normal. No respiratory distress.      Breath sounds: Normal breath sounds.   Abdominal:      General: Bowel sounds are normal.      Palpations: Abdomen is soft.      Tenderness: There is no abdominal tenderness.   Musculoskeletal:         General: No swelling.      Cervical back: Neck supple.      Left hip: Tenderness present.      Left upper leg: Tenderness and bony tenderness  present.   Skin:     General: Skin is warm and dry.      Capillary Refill: Capillary refill takes less than 2 seconds.   Neurological:      General: No focal deficit present.      Mental Status: She is alert and oriented to person, place, and time. Mental status is at baseline.   Psychiatric:         Mood and Affect: Mood normal.       Patient Data     Labs Ordered/Reviewed   CBC WITH DIFF - Abnormal; Notable for the following components:       Result Value    LYMPHOCYTE # 0.96 (*)     All other components within normal limits   BASIC METABOLIC PANEL - Normal    Narrative:     Estimated Glomerular Filtration Rate (eGFR) is calculated using the CKD-EPI (2021) equation, intended for patients 18 years of  age and older. If gender is not documented or "unknown", there will be no eGFR calculation.   CBC/DIFF    Narrative:     The following orders were created for panel order CBC/DIFF.  Procedure                               Abnormality         Status                     ---------                               -----------         ------                     CBC WITH GX:[id number]                Abnormal            Final result                 Please view results for these tests on the individual orders.   TYPE AND SCREEN     XR FEMUR LEFT   Final Result by Edi, Radresults In (02/19 1744)   Acute intertrochanteric left femur fracture      XR KNEE LEFT TRAUMA SERIES   Final Result by Edi, Radresults In (02/19 1743)   No fracture or traumatic malalignment      XR PELVIS   Final Result by Edi, Radresults In (02/19 1742)   Acute intertrochanteric left femur fracture        Medical Decision Making        Medical Decision Making  Pt presents s/p ground level with complaint of L hip pain.   Upon arrival, she is awake/alert, non-toxic appearing, VS unremarkable.   Primary differentials are fracture v. Strain v. Contusion.   Xrays wth L intertrochanteric fracture.  Pt requested [facility] transfer for orthopedics.   PT accepted by Dr.  Hale Bogus and Dr. Vickey Sages.   Pt transferred via EMS to facility.     Amount and/or Complexity of Data Reviewed  Labs: ordered.  Radiology: ordered. Decision-making details documented in ED Course.  ECG/medicine tests: independent interpretation performed.    Risk  Decision regarding hospitalization.      ED Course as of 03/09/22 1206   Mon Mar 03, 2022   1743 XR KNEE LEFT TRAUMA SERIES  IMPRESSION:  No fracture or traumatic malalignment     1744 XR PELVIS  IMPRESSION:  Acute intertrochanteric left femur fracture   1744 Pt requesting [facility] transfer, order placed.    1744 XR FEMUR LEFT  IMPRESSION:  Acute intertrochanteric left femur fracture     1817 Pt accepted at [facility], under Dr. Hale Bogus with orthopedics and Dr. Vickey Sages hospitalist. Pending bed assignment and transfer at this time.    1818 CBC/DIFF(!)  WDL   1921 ABO/RH(D): B NEGATIVE   [id number] BASIC METABOLIC PANEL  WDL            Clinical Impression   Closed intertrochanteric fracture, left, initial encounter (CMS [facility]) (Primary)       Disposition: Transfered to Another Facility       The supervising physician was physically present and available for consultation, and did not physically see the patient.    Stanton Kidney  Barnie Del, APRN,AGACNP-BC

## 2022-03-04 ENCOUNTER — Inpatient Hospital Stay (HOSPITAL_COMMUNITY): Payer: Medicare Other

## 2022-03-04 ENCOUNTER — Encounter (HOSPITAL_COMMUNITY)
Admission: AD | Disposition: A | Discharge status: Rehabilitation Facility | Payer: Self-pay | Source: Other Acute Inpatient Hospital | Attending: Surgery

## 2022-03-04 ENCOUNTER — Encounter (HOSPITAL_COMMUNITY): Payer: Self-pay | Admitting: Student in an Organized Health Care Education/Training Program

## 2022-03-04 ENCOUNTER — Inpatient Hospital Stay (HOSPITAL_COMMUNITY): Payer: Medicare Other | Admitting: Anesthesiology

## 2022-03-04 DIAGNOSIS — Y92481 Parking lot as the place of occurrence of the external cause: Secondary | ICD-10-CM

## 2022-03-04 DIAGNOSIS — W1830XA Fall on same level, unspecified, initial encounter: Secondary | ICD-10-CM

## 2022-03-04 DIAGNOSIS — S72142A Displaced intertrochanteric fracture of left femur, initial encounter for closed fracture: Secondary | ICD-10-CM

## 2022-03-04 LAB — BASIC METABOLIC PANEL
ANION GAP: 10 mmol/L (ref 4–13)
BUN/CREA RATIO: 17 (ref 6–22)
BUN: 10 mg/dL (ref 8–25)
CALCIUM: 9.1 mg/dL (ref 8.6–10.3)
CHLORIDE: 109 mmol/L (ref 96–111)
CO2 TOTAL: 22 mmol/L — ABNORMAL LOW (ref 23–31)
CREATININE: 0.6 mg/dL (ref 0.60–1.05)
ESTIMATED GFR - FEMALE: 88 mL/min/BSA (ref >=60)
GLUCOSE: 93 mg/dL (ref 65–125)
POTASSIUM: 4.1 mmol/L (ref 3.5–5.1)
SODIUM: 141 mmol/L (ref 136–145)

## 2022-03-04 LAB — CBC WITH DIFF
BASOPHIL #: <0.1 10*3/uL (ref <=0.20)
BASOPHIL %: 0 %
EOSINOPHIL #: <0.1 10*3/uL (ref <=0.50)
EOSINOPHIL %: 0 %
HCT: 42.5 % (ref 34.8–46.0)
HGB: 13.7 g/dL (ref 11.5–16.0)
IMMATURE GRANULOCYTE #: <0.1 10*3/uL (ref <0.10)
IMMATURE GRANULOCYTE %: 0 % (ref 0.0–1.0)
LYMPHOCYTE #: 1.53 10*3/uL (ref 1.00–4.80)
LYMPHOCYTE %: 13 %
MCH: 28 pg (ref 26.0–32.0)
MCHC: 32.2 g/dL (ref 31.0–35.5)
MCV: 86.9 fL (ref 78.0–100.0)
MONOCYTE #: 1.08 10*3/uL (ref 0.20–1.10)
MONOCYTE %: 9 %
MPV: 10.6 fL (ref 8.7–12.5)
NEUTROPHIL #: 9.51 10*3/uL — ABNORMAL HIGH (ref 1.50–7.70)
NEUTROPHIL %: 78 %
PLATELETS: 183 10*3/uL (ref 150–400)
RBC: 4.89 10*6/uL (ref 3.85–5.22)
RDW-CV: 12.8 % (ref 11.5–15.5)
WBC: 12.3 10*3/uL — ABNORMAL HIGH (ref 3.7–11.0)

## 2022-03-04 LAB — MAGNESIUM: MAGNESIUM: 1.9 mg/dL (ref 1.8–2.6)

## 2022-03-04 LAB — PT/INR: INR: 0.95 (ref 0.80–1.10)

## 2022-03-04 LAB — PHOSPHORUS: PHOSPHORUS: 2.9 mg/dL (ref 2.3–4.0)

## 2022-03-04 SURGERY — INSERTION NAIL TROCHANTERIC FEMUR
Anesthesia: General | Laterality: Left | Wound class: Clean Wound: Uninfected operative wounds in which no inflammation occurred

## 2022-03-04 MED ORDER — PROPOFOL 10 MG/ML IV BOLUS
INJECTION | Freq: Once | INTRAVENOUS | Status: DC | PRN
Start: 2022-03-04 — End: 2022-03-04
  Administered 2022-03-04: 150 mg via INTRAVENOUS

## 2022-03-04 MED ORDER — CARBOXYMETHYLCELLULOSE 0.5 %-GLYCERIN 0.9 % EYE DROPS
1.0000 [drp] | Freq: Every day | OPHTHALMIC | Status: DC
Start: 2022-03-04 — End: 2022-03-07
  Administered 2022-03-04: 1 [drp] via OPHTHALMIC
  Administered 2022-03-04 (×2): 0 [drp] via OPHTHALMIC
  Administered 2022-03-05 – 2022-03-06 (×8): 1 [drp] via OPHTHALMIC
  Administered 2022-03-06: 0 [drp] via OPHTHALMIC
  Administered 2022-03-06 – 2022-03-07 (×2): 1 [drp] via OPHTHALMIC
  Administered 2022-03-07: 0 [drp] via OPHTHALMIC
  Administered 2022-03-07: 1 [drp] via OPHTHALMIC
  Administered 2022-03-07: 0 [drp] via OPHTHALMIC

## 2022-03-04 MED ORDER — HYDRALAZINE 20 MG/ML INJECTION SOLUTION
5.0000 mg | Freq: Once | INTRAMUSCULAR | Status: DC | PRN
Start: 2022-03-04 — End: 2022-03-04

## 2022-03-04 MED ORDER — ONDANSETRON HCL (PF) 4 MG/2 ML INJECTION SOLUTION
Freq: Once | INTRAMUSCULAR | Status: DC | PRN
Start: 2022-03-04 — End: 2022-03-04
  Administered 2022-03-04: 4 mg via INTRAVENOUS

## 2022-03-04 MED ORDER — ONDANSETRON HCL (PF) 4 MG/2 ML INJECTION SOLUTION
INTRAMUSCULAR | Status: AC
Start: 2022-03-04 — End: 2022-03-04
  Filled 2022-03-04: qty 2

## 2022-03-04 MED ORDER — LABETALOL 5 MG/ML INTRAVENOUS SOLUTION
5.0000 mg | INTRAVENOUS | Status: DC | PRN
Start: 2022-03-04 — End: 2022-03-04

## 2022-03-04 MED ORDER — CLINDAMYCIN 900 MG/50 ML IN 5 % DEXTROSE INTRAVENOUS PIGGYBACK
INJECTION | INTRAVENOUS | Status: AC
Start: 2022-03-04 — End: 2022-03-04
  Filled 2022-03-04: qty 50

## 2022-03-04 MED ORDER — IPRATROPIUM 0.5 MG-ALBUTEROL 3 MG (2.5 MG BASE)/3 ML NEBULIZATION SOLN
3.0000 mL | INHALATION_SOLUTION | Freq: Once | RESPIRATORY_TRACT | Status: DC | PRN
Start: 2022-03-04 — End: 2022-03-04

## 2022-03-04 MED ORDER — ONDANSETRON HCL (PF) 4 MG/2 ML INJECTION SOLUTION
4.0000 mg | Freq: Once | INTRAMUSCULAR | Status: DC | PRN
Start: 2022-03-04 — End: 2022-03-04

## 2022-03-04 MED ORDER — LACTATED RINGERS INTRAVENOUS SOLUTION
INTRAVENOUS | Status: DC
Start: 2022-03-04 — End: 2022-03-04
  Administered 2022-03-04: 0 mL via INTRAVENOUS

## 2022-03-04 MED ORDER — CARBOXYMETHYLCELLULOSE SODIUM 0.5 % EYE DROPS
1.0000 [drp] | Freq: Every day | OPHTHALMIC | Status: DC
Start: 2022-03-04 — End: 2022-03-04

## 2022-03-04 MED ORDER — MEPERIDINE (PF) 25 MG/ML INJECTION SOLUTION
12.5000 mg | INTRAMUSCULAR | Status: DC | PRN
Start: 2022-03-04 — End: 2022-03-04

## 2022-03-04 MED ORDER — FENTANYL (PF) 50 MCG/ML INJECTION SOLUTION
Freq: Once | INTRAMUSCULAR | Status: DC | PRN
Start: 2022-03-04 — End: 2022-03-04
  Administered 2022-03-04 (×2): 50 ug via INTRAVENOUS

## 2022-03-04 MED ORDER — SODIUM CHLORIDE 0.9 % IRRIGATION SOLUTION
Freq: Once | Status: DC | PRN
Start: 2022-03-04 — End: 2022-03-04
  Administered 2022-03-04: 500 mL

## 2022-03-04 MED ORDER — HYDROMORPHONE (PF) 0.5 MG/0.5 ML INJECTION SYRINGE
0.5000 mg | INJECTION | INTRAMUSCULAR | Status: DC | PRN
Start: 2022-03-04 — End: 2022-03-04

## 2022-03-04 MED ORDER — ACETAMINOPHEN 1,000 MG/100 ML (10 MG/ML) INTRAVENOUS SOLUTION
15.0000 mg/kg | Freq: Four times a day (QID) | INTRAVENOUS | Status: DC | PRN
Start: 2022-03-04 — End: 2022-03-05
  Administered 2022-03-05: 0 mg via INTRAVENOUS
  Administered 2022-03-05: 687 mg via INTRAVENOUS
  Filled 2022-03-04 (×2): qty 100

## 2022-03-04 MED ORDER — FENTANYL (PF) 50 MCG/ML INJECTION SOLUTION
INTRAMUSCULAR | Status: AC
Start: 2022-03-04 — End: 2022-03-04
  Filled 2022-03-04: qty 2

## 2022-03-04 MED ORDER — ACETAMINOPHEN 1,000 MG/100 ML (10 MG/ML) INTRAVENOUS SOLUTION
Freq: Once | INTRAVENOUS | Status: DC | PRN
Start: 2022-03-04 — End: 2022-03-04
  Administered 2022-03-04: 1000 mg via INTRAVENOUS

## 2022-03-04 MED ORDER — FENTANYL (PF) 50 MCG/ML INJECTION SOLUTION
25.0000 ug | INTRAMUSCULAR | Status: DC | PRN
Start: 2022-03-04 — End: 2022-03-04

## 2022-03-04 MED ORDER — PROCHLORPERAZINE EDISYLATE 10 MG/2 ML (5 MG/ML) INJECTION SOLUTION
5.0000 mg | Freq: Once | INTRAMUSCULAR | Status: DC | PRN
Start: 2022-03-04 — End: 2022-03-04

## 2022-03-04 MED ORDER — CLINDAMYCIN 900 MG/50 ML IN 5 % DEXTROSE INTRAVENOUS PIGGYBACK
INJECTION | Freq: Once | INTRAVENOUS | Status: DC | PRN
Start: 2022-03-04 — End: 2022-03-04
  Administered 2022-03-04: 600 mg via INTRAVENOUS

## 2022-03-04 SURGICAL SUPPLY — 29 items
APPL 72.5% ISPRP 0.83% PVP IOD_40ML PRVL FX PREP 1 STEP (MED SURG SUPPLIES) ×1 IMPLANT
BLADE SURG CLPR W 37.2MM GP EXIST HNDL GTT IN CHRG .23MM NONST LF  DISP (MED SURG SUPPLIES) ×1 IMPLANT
CONV USE 162472 - TRAY CATH 16FR 16IN 1 LYR DRAIN BAG FOLEY 2W SIL ELASTO ADULT 10ML LTX (UROLOGICAL SUPPLIES) ×1
CONV USE 339107 - TRAY CATH 16FR 16IN 1 LYR DRAIN BAG FOLEY 2W SIL ELASTO ADULT 10ML LTX (UROLOGICAL SUPPLIES) ×1
COVER OR TBL 3IN HANA HANA SSXT PROFX PERI POST DISP (DRAPE/PACKS/SHEETS/OR TOWEL) ×1 IMPLANT
COVER RIGID STRL LF  CNVRT LIGHT HNDL PLASTIC DISP GRN (MED SURG SUPPLIES) ×1 IMPLANT
CUP MED 2OZ PLASTIC GRAD STRL LF  DISP (MED SURG SUPPLIES) ×2 IMPLANT
DRAPE CARM FLRSCP EXPD CLPSBL C-ARMOR STRL EQP (DRAPE/PACKS/SHEETS/OR TOWEL) ×1 IMPLANT
DRAPE FNFLD ABS REINF 77X53IN 43528 PRXM LF  STRL DISP SURG SMS 44X23IN (DRAPE/PACKS/SHEETS/OR TOWEL) ×1 IMPLANT
DRAPE PCH INCS ISOL ADH STRP 125X84IN INVISISHIELD LF  STRL DISP SURG POLY 19X10IN CLR (DRAPE/PACKS/SHEETS/OR TOWEL) IMPLANT
DRESS 5.6X6.1IN HI ABS ADH WTP RF OUTR LYR POLYUR LF OPTFM (WOUND CARE SUPPLY) ×1 IMPLANT
DRESS ALG 4.75X4IN ACTCT NCRSTLN SILVER POLYUR ANTIMIC BARRIER ABS WTPRF FILM LF  STRL DISP (WOUND CARE SUPPLY) ×1 IMPLANT
DRESS ALG 8X4IN ACTCT POLYUR NCRSTLN SILVER ABS ANTIMIC BARRIER POSTOP WTPRF FILM LF  STRL (WOUND CARE SUPPLY) ×1 IMPLANT
DRESS PETRO 8X1IN CURAD XR COTTON NONADH OCL IMPREGNATE LF  STRL WHT (WOUND CARE SUPPLY) ×1 IMPLANT
ELECTRODE PATIENT RTN 9FT VLAB C30- LB RM PHSV ACRL FOAM CORD NONIRRITATE NONSENSITIZE ADH STRP (SURGICAL CUTTING SUPPLIES) ×1 IMPLANT
GUIDEWIRE BULLET TIP ZIMMER (SURGICAL INSTRUMENTS) ×1 IMPLANT
GUIDEWIRE IM FEM (WIRE) ×1 IMPLANT
GW ORTHO 3MM 100CM BALL TIP NATURAL NAIL SYS STRL LF (MED SURG SUPPLIES) ×1 IMPLANT
KIT RM TURNOVER STPC DISP (DRAPE/PACKS/SHEETS/OR TOWEL) ×1 IMPLANT
NAIL 9MM LG SCREW MLT LOCK HIP FEM 180MM AFFIXUS INTRAMED 130D (IMPLANTS TRAUMA) ×1 IMPLANT
PEN SURG MRKNG DVN SKIN DISP NONSMEAR REG TIP FLXB RLR LBL GNTN VIOL STRL LF (MED SURG SUPPLIES) ×1 IMPLANT
SCREW BONE AFFIXUS 5MM 32MM HEX DRIVER SCKT HIP FEM CORT 3.5MM STRL (IMPLANTS TRAUMA) ×1 IMPLANT
SPONGE GAUZE 4X4IN MDCHC COTTON 16 PLY USP TY VII LF  STRL DISP (WOUND CARE SUPPLY) ×1 IMPLANT
STAPLER SKIN 35 CNT REG STPL CART ERG HNDL REM LF  APPOSE DISP CLR SS PLASTIC (SUTURE/WOUND CLOSURE) ×1 IMPLANT
STRIP 4X.25IN STRSTRP PLSTR REINF SKNCLS WHT STRL LF (WOUND CARE SUPPLY) ×2 IMPLANT
TRAY CATH 16FR 16IN 1 LYR DRAIN BAG FOLEY 2W SIL ELASTO ADULT 10ML LTX (UROLOGICAL SUPPLIES) ×1 IMPLANT
TRAY FRACTURE HIP - ~~LOC~~ (CUSTOM TRAYS & PACK) ×1 IMPLANT
TRAY SURG FRACTURE HIP DISP STRL LF (CUSTOM TRAYS & PACK) ×1
ZIMMER AFFIUS HIP NAIL FRACTURE ×1 IMPLANT

## 2022-03-04 NOTE — Nurses Notes (Signed)
EOS: Patient admitted for fall w/ left hip fracture. A/O x4 rested well throughout the night. NPO since midnight for surgery today.  Minimal c/o pain, refusing pain medication at this time. Bed alarms for patient safety. Call light within reach.    Chilton Greathouse, LPN

## 2022-03-04 NOTE — Consults (Signed)
Patient seen and examined.  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  She was at the grocery store when she fell sustaining a ground level fall with immediate pain on the left hip.  Bystanders in the parking lot assisted her.  She denies any dizziness chest pain shortness of breath or lightheadedness prior to the fall.  She otherwise is a very healthy active female.  She was diagnosed with a left intertrochanteric hip fracture in [facility] and transferred here for orthopedic management and care as well as medical care per the hospitalist.  PAST MEDICAL:         Past Medical History:   Diagnosis Date    Back pain      DDD (degenerative disc disease), cervical      Dry eye      Fracture of nasal bones      Hypertriglyceridemia      Insomnia      Kyphoscoliosis and scoliosis      Lumbar spinal stenosis      Osteoarthrosis      Osteoporosis      Spinal stenosis      Thyroid nodule      Vitamin D deficiency                   Past Surgical History:   Procedure Laterality Date    HX CATARACT REMOVAL Left      HX CHOLECYSTECTOMY        HX COLONOSCOPY        HX DILATION AND CURETTAGE        HX OTHER         trigger finger, repair quad    HX TONSILLECTOMY        HX WISDOM TEETH EXTRACTION                          Allergies   Allergen Reactions    Antihistamine [Diphenhydramine Hcl] Swelling    Penicillins         Rash, Dizziness    Vancomycin  Other Adverse Reaction (Add comment)       Phlebitis    Flagyl [Metronidazole]      Gentamicin      Iodine And Iodide Containing Products      Lidocaine         Except xylocaine. Ok taking that    Other         Antihistamines and all caines, Pt states Xylocaine is ok    Tetracycline              Family History  Family Medical History:            Problem Relation (Age of Onset)     Cancer Other, Father, Brother     Diabetes Other, Mother     HTN <20 y.o. Other     Heart Disease Mother                   Social History  Social History            Tobacco Use    Smoking status: Former    Smokeless tobacco: Never   Substance Use Topics    Alcohol use: No   The above past medical history, past surgical history, allergies, medications, family history and social history were reviewed with the patient and noted as above and to be to be accurate and correct  the date.  A 12 point review of systems was reviewed with the patient in its entirety and negative except per the HPI.  Physical examination:  Patient has good dorsalis pedis and tibialis post pulses with warm pink toes and brisk cap refill.  She is able to dorsiflex and plantar flex the ankle with 5/5 strength.  She has no pain with range of motion of the right knee or left knee.  She is pain with log rolling and range of motion of the left hip.  She has tenderness to palpation of the left hip with mild ecchymosis.  A secondary survey reveals no tenderness or swelling or pain with range of motion of either the right or left upper extremity or the right lower extremity.  Cranial nerves 2-12 are grossly intact.  Skin is clean dry and intact without open areas  X-rays:  X-rays to include an AP pelvis, left hip two views, left femur two views and left knee two views were personally reviewed by me and show only a left intertrochanteric hip fracture.  Labs:  A CBC, CMP, PT INR were personally reviewed by me and within normal limits and acceptable for surgery.  Assessment/plan:  Left intertrochanteric hip fracture 2 part minimally displaced.  This will require open reduction internal fixation today using an intramedullary device.  I explained the patient the risks and benefits of the surgery to include pain, infection, nerve injury, tendon injury, bleeding and malunion/nonunion.  I explained to her the postoperative course to include weight-bearing as tolerated immediately after surgery and physical therapy.  She is willing to proceed with the surgery today she will be NPO.

## 2022-03-04 NOTE — Care Plan (Signed)
Problem: Adult Inpatient Plan of Care  Goal: Absence of Hospital-Acquired Illness or Injury  Outcome: Ongoing (see interventions/notes)  Goal: Optimal Comfort and Wellbeing  Outcome: Ongoing (see interventions/notes)     Problem: Pain Acute  Goal: Optimal Pain Control and Function  Outcome: Ongoing (see interventions/notes)

## 2022-03-04 NOTE — Care Management Notes (Signed)
[facility] Management Note    Patient Name: Sheri Garza  Date of Birth: March 24, 1935  Sex: female  Date/Time of Admission: 03/03/2022  9:21 PM  Room/Bed: 5335/A  Payor: MEDICARE / Plan: MEDICARE PART A AND B / Product Type: Medicare /    LOS: 1 day   PCP: [place]    Admitting Diagnosis:  Closed left hip fracture, initial encounter (CMS [place]) [S72.002A]    Assessment:      03/04/22 0800   Medicare Intent to Discharge Documentation   Admit IMM given to: Patient   Admit IMM letter given date 03/03/22   Admit IMM letter time given 2200   IMM explained/reviewed with:  Patient       Nursing Staff obtained IMM at time of admission.  Verified patient signed/dated/timed IMM.    Discharge Plan:        The patient will continue to be evaluated for developing discharge needs.     Case Manager: Alba Destine  Phone: [phone]

## 2022-03-04 NOTE — Progress Notes (Addendum)
[facility]  Hospitalist Progress Note    Sheri Garza       87 y.o.       Date of service: 03/04/2022  Date of Admission:  03/03/2022    Hospital Day:  LOS: 1 day   CC: No chief complaint on file.      Subjective:  Patient seen and examined.  Lying in bed with family at bedside.  Complains of discomfort but refuses any pain medication.  NPO for surgery later today.    Objective:   Filed Vitals:    03/03/22 2139 03/04/22 0146 03/04/22 0217 03/04/22 0700   BP: (!) 141/77 (!) 117/104 125/67 (!) 123/57   Pulse: (!) 103 89 99 81   Resp: 18 18 18 16   $ Temp: 36.8 C (98.2 F) 36 C (96.8 F) 36 C (96.8 F) 37 C (98.6 F)   SpO2: 97% 95% 97% 97%     I have reviewed the vitals.    Physical Exam:  Constitutional: Patient is in no acute distress.  Appears younger than stated age, thin  HEENT: Head normocephalic and atraumatic.   Lungs: Clear to auscultation bilaterally without wheezes, rales or rhonchi.  Cardiovascular: Regular rate and rhythm.   Abdomen: Soft, nontender, nondistended. No rebound, Guarding, or masses noted.   MSK/Extremities: Moves all extremities.  Groin pain with any movement of left leg  Skin: No lesions noted.  No rashes.  Neuro: Normal sensation and strength bilaterally. Normal gait. Normal coordination. CNs 2-12 grossly intact. No facial droop noted.    Psych: Alert and oriented to person, place, and time. Normal affect    Labs:  Lab Results Today:    Results for orders placed or performed during the hospital encounter of 03/03/22 (from the past 24 hour(s))   BASIC METABOLIC PANEL, NON-FASTING   Result Value Ref Range    SODIUM 141 136 - 145 mmol/L    POTASSIUM 4.1 3.5 - 5.1 mmol/L    CHLORIDE 109 96 - 111 mmol/L    CO2 TOTAL 22 (L) 23 - 31 mmol/L    ANION GAP 10 4 - 13 mmol/L    CALCIUM 9.1 8.6 - 10.3 mg/dL    GLUCOSE 93 65 - 125 mg/dL    BUN 10 8 - 25 mg/dL    CREATININE 0.60 0.60 - 1.05 mg/dL    BUN/CREA RATIO 17 6 - 22    ESTIMATED GFR - FEMALE 88 >=60 mL/min/BSA   MAGNESIUM   Result  Value Ref Range    MAGNESIUM 1.9 1.8 - 2.6 mg/dL   PHOSPHORUS   Result Value Ref Range    PHOSPHORUS 2.9 2.3 - 4.0 mg/dL   CBC WITH DIFF   Result Value Ref Range    WBC 12.3 (H) 3.7 - 11.0 x10^3/uL    RBC 4.89 3.85 - 5.22 x10^6/uL    HGB 13.7 11.5 - 16.0 g/dL    HCT 42.5 34.8 - 46.0 %    MCV 86.9 78.0 - 100.0 fL    MCH 28.0 26.0 - 32.0 pg    MCHC 32.2 31.0 - 35.5 g/dL    RDW-CV 12.8 11.5 - 15.5 %    PLATELETS 183 150 - 400 x10^3/uL    MPV 10.6 8.7 - 12.5 fL    NEUTROPHIL % 78.0 %    LYMPHOCYTE % 13.0 %    MONOCYTE % 9.0 %    EOSINOPHIL % 0.0 %    BASOPHIL % 0.0 %    NEUTROPHIL #  9.51 (H) 1.50 - 7.70 x10^3/uL    LYMPHOCYTE # 1.53 1.00 - 4.80 x10^3/uL    MONOCYTE # 1.08 0.20 - 1.10 x10^3/uL    EOSINOPHIL # <0.10 <=0.50 x10^3/uL    BASOPHIL # <0.10 <=0.20 x10^3/uL    IMMATURE GRANULOCYTE % 0.0 0.0 - 1.0 %    IMMATURE GRANULOCYTE # <0.10 <0.10 x10^3/uL   PT/INR   Result Value Ref Range    INR 0.95 0.80 - 1.10     I have reviewed all labs.    Micro: No results found for any visits on 03/03/22 (from the past 96 hour(s)).    Radiology:       Assessment/ Plan:   Active Hospital Problems    Diagnosis    Primary Problem: Closed left hip fracture, initial encounter (CMS [facility])     Mechanical fall  Left hip intertrochanteric fracture:   Orthopedic on board, plan for OR today  Patient reports pain but refuses any pain medication.  Will add IV Tylenol p.r.n.  will be careful ordering pain medications since she indicates she has a lot of allergies has never taken any pain medication including Tylenol or NSAIDs  SCD for DVT prophylaxis        Osteoporosis:   Was not able to tolerate Prolia in the past     Macular degeneration:   Keratoconjunctivitis sicca:   On AERDS2 multivitamin daily currently not available in formulary  Only uses preservative-free ocular lubricant due to allergy which is not available in the pharmacy-patient brought in home med       DVT/PE Prophylaxis: SCDs/ Venodynes/Impulse boots    Disposition Planning:   Pending PT eval      Jonah Blue, FNP-BC  03/04/2022, 11:26

## 2022-03-04 NOTE — Care Plan (Signed)
Problem: Adult Inpatient Plan of Care  Goal: Plan of Care Review  Outcome: Ongoing (see interventions/notes)  Goal: Patient-Specific Goal (Individualized)  Outcome: Ongoing (see interventions/notes)  Goal: Absence of Hospital-Acquired Illness or Injury  Outcome: Ongoing (see interventions/notes)  Intervention: Identify and Manage Fall Risk  Recent Flowsheet Documentation  Taken 03/04/2022 2200 by Court Joy, LPN  Safety Promotion/Fall Prevention:   activity supervised   fall prevention program maintained   nonskid shoes/slippers when out of bed   safety round/check completed  Taken 03/04/2022 2000 by Court Joy, LPN  Safety Promotion/Fall Prevention:   activity supervised   fall prevention program maintained   nonskid shoes/slippers when out of bed   safety round/check completed  Intervention: Prevent and Manage VTE (Venous Thromboembolism) Risk  Recent Flowsheet Documentation  Taken 03/04/2022 2249 by Court Joy, LPN  VTE Prevention/Management: compression stockings off  Taken 03/04/2022 2000 by Court Joy, LPN  VTE Prevention/Management: sequential compression devices off  Goal: Optimal Comfort and Wellbeing  Outcome: Ongoing (see interventions/notes)  Intervention: Provide Person-Centered Care  Recent Flowsheet Documentation  Taken 03/04/2022 2200 by Court Joy, [place] Relationship/Rapport:   care explained   emotional support provided   questions answered  Goal: Rounds/Family Conference  Outcome: Ongoing (see interventions/notes)     Problem: Pain Acute  Goal: Optimal Pain Control and Function  Outcome: Ongoing (see interventions/notes)  Intervention: Prevent or Manage Pain  Recent Flowsheet Documentation  Taken 03/04/2022 2000 by Court Joy, LPN  Sleep/Rest Enhancement: awakenings minimized  Intervention: Optimize Psychosocial Wellbeing  Recent Flowsheet Documentation  Taken 03/04/2022 2200 by Court Joy, LPN  Diversional Activities: television     Problem: Fall Injury Risk  Goal: Absence of  Fall and Fall-Related Injury  Outcome: Ongoing (see interventions/notes)  Intervention: Promote Injury-Free Environment  Recent Flowsheet Documentation  Taken 03/04/2022 2200 by Court Joy, LPN  Safety Promotion/Fall Prevention:   activity supervised   fall prevention program maintained   nonskid shoes/slippers when out of bed   safety round/check completed  Taken 03/04/2022 2000 by Court Joy, LPN  Safety Promotion/Fall Prevention:   activity supervised   fall prevention program maintained   nonskid shoes/slippers when out of bed   safety round/check completed     Problem: Skin Injury Risk Increased  Goal: Skin Health and Integrity  Outcome: Ongoing (see interventions/notes)     Problem: Orthopaedic Fracture  Goal: Absence of Bleeding  Outcome: Ongoing (see interventions/notes)  Goal: Effective Bowel Elimination  Outcome: Ongoing (see interventions/notes)  Goal: Absence of Embolism Signs and Symptoms  Outcome: Ongoing (see interventions/notes)  Intervention: Prevent or Manage Embolism Risk  Recent Flowsheet Documentation  Taken 03/04/2022 2249 by Court Joy, LPN  VTE Prevention/Management: compression stockings off  Taken 03/04/2022 2000 by Court Joy, LPN  VTE Prevention/Management: sequential compression devices off  Goal: Fracture Stability  Outcome: Ongoing (see interventions/notes)  Goal: Optimal Functional Ability  Outcome: Ongoing (see interventions/notes)  Goal: Absence of Infection Signs and Symptoms  Outcome: Ongoing (see interventions/notes)  Intervention: Prevent or Manage Infection  Recent Flowsheet Documentation  Taken 03/04/2022 2000 by Court Joy, LPN  Fever Reduction/Comfort Measures:   lightweight bedding   lightweight clothing  Goal: Effective Tissue Perfusion  Outcome: Ongoing (see interventions/notes)  Goal: Optimal Pain Control and Function  Outcome: Ongoing (see interventions/notes)  Intervention: Manage Acute Orthopaedic-Related Pain  Recent Flowsheet Documentation  Taken 03/04/2022  2000 by Court Joy, LPN  Sleep/Rest Enhancement: awakenings minimized  Goal: Effective Oxygenation and Ventilation  Outcome: Ongoing (see interventions/notes)  Intervention: Promote Airway Secretion Clearance  Recent Flowsheet Documentation  Taken 03/04/2022 2000 by Court Joy, LPN  Cough And Deep Breathing: done independently per patient     Problem: Fall Injury Risk  Goal: Absence of Fall and Fall-Related Injury  Outcome: Ongoing (see interventions/notes)  Intervention: Promote Injury-Free Environment  Recent Flowsheet Documentation  Taken 03/04/2022 2200 by Court Joy, LPN  Safety Promotion/Fall Prevention:   activity supervised   fall prevention program maintained   nonskid shoes/slippers when out of bed   safety round/check completed  Taken 03/04/2022 2000 by Court Joy, LPN  Safety Promotion/Fall Prevention:   activity supervised   fall prevention program maintained   nonskid shoes/slippers when out of bed   safety round/check completed

## 2022-03-04 NOTE — Anesthesia Transfer of Care (Signed)
ANESTHESIA TRANSFER OF CARE   Sheri Garza is a 87 y.o. ,female, Weight: 45.8 kg (100 lb 15.5 oz)   had Procedure(s):  INSERTION NAIL TROCHANTERIC FEMUR  performed  03/04/22   Primary Service: Warnell Bureau, MD    Past Medical History:   Diagnosis Date    Back pain     DDD (degenerative disc disease), cervical     Dry eye     Fracture of nasal bones     Hypertriglyceridemia     Insomnia     Kyphoscoliosis and scoliosis     Lumbar spinal stenosis     Osteoarthrosis     Osteoporosis     Spinal stenosis     Thyroid nodule     Vitamin D deficiency       Allergy History as of 03/04/22       PENICILLINS         Noted Status Severity Type Reaction    03/03/22 2143 Warnell Bureau, MD 08/10/12 Active High      Comments: Rash, Dizziness     08/10/12 0850 Charlaine Dalton, LPN 624THL Active                 IODINE AND IODIDE CONTAINING PRODUCTS         Noted Status Severity Type Reaction    08/10/12 0851 Charlaine Dalton, LPN 624THL Active                 OTHER         Noted Status Severity Type Reaction    11/24/16 1340 [place], [place], [facility] 08/10/12 Active       Comments: Antihistamines and all caines, Pt states Xylocaine is ok     08/10/12 0909 Charlaine Dalton, LPN 624THL Active       Comments: Antihistamines and all caines     08/10/12 0908 Charlaine Dalton, LPN 624THL Active       Comments: Antihistamines     08/10/12 0908 Charlaine Dalton, LPN 624THL Active                 GENTAMICIN         Noted Status Severity Type Reaction    08/10/12 0909 Charlaine Dalton, LPN 624THL Active                 VANCOMYCIN         Noted Status Severity Type Reaction    03/03/22 2143 Warnell Bureau, MD 08/10/12 Active High   Other Adverse Reaction (Add comment)    Comments: Phlebitis     08/10/12 0909 Charlaine Dalton, LPN 624THL Active                 METRONIDAZOLE         Noted Status Severity Type Reaction    08/10/12 0909 Charlaine Dalton, LPN 624THL Active                 TETRACYCLINE         Noted Status Severity  Type Reaction    08/10/12 0910 Charlaine Dalton, LPN 624THL Active                 LIDOCAINE         Noted Status Severity Type Reaction    03/03/22 2141 Warnell Bureau, MD 03/03/22 Active       Comments: Except xylocaine. Ok taking that               DIPHENHYDRAMINE HCL  Noted Status Severity Type Reaction    03/03/22 2142 Warnell Bureau, MD 03/03/22 Active High  Swelling    03/03/22 2142 Warnell Bureau, MD 03/03/22 Active   Mental Status Effect                  I completed my transfer of care / handoff to the receiving personnel during which we discussed:  Access, Airway, All key/critical aspects of case discussed, Analgesia, Antibiotics, Expectation of post procedure, Fluids/Product, Gave opportunity for questions and acknowledgement of understanding, Labs and PMHx      Post Location: PACU                                                             Last OR Temp: Temperature: 36.7 C (98.1 F)  ABG:  POTASSIUM   Date Value Ref Range Status   03/04/2022 4.1 3.5 - 5.1 mmol/L Final     CALCIUM   Date Value Ref Range Status   03/04/2022 9.1 8.6 - 10.3 mg/dL Final     Comment:     Gadolinium-containing contrast can interfere with calcium measurement.     Airway:* No LDAs found *  Blood pressure 130/75, pulse 80, temperature 36.7 C (98.1 F), resp. rate 13, height 1.499 m (4' 11"$ ), weight 45.8 kg (100 lb 15.5 oz), SpO2 100%.

## 2022-03-04 NOTE — Brief Op Note (Signed)
[facility]                                                     BRIEF OPERATIVE NOTE    Patient Name: Sheri, Garza Number: [id number]  Date of Service: 03/04/2022   Date of Birth: 06-27-1935    All elements must be documented.    Pre-Operative Diagnosis: left hip fracture   Post-Operative Diagnosis: same   Procedure(s)/Description:  left hip orif     Attending Surgeon: Dr Glendell Docker T. Naela Nodal, MD   Assistant(s): Karle Barr PA-C     Anesthesia Type: General  Estimated Blood Less:  Minimal  Complications (not routinely expected or not inherent to difficulty/nature of procedure):none    Specimens/ Cultures: none            Disposition: PACU - hemodynamically stable.  Condition: stable    Wendelyn Breslow, MD

## 2022-03-04 NOTE — Anesthesia Postprocedure Evaluation (Signed)
Anesthesia Post Op Evaluation    Patient: Sheri Garza  Procedure(s):  INSERTION NAIL TROCHANTERIC FEMUR    Last Vitals:Temperature: 36.7 C (98.1 F) (03/04/22 1617)  Heart Rate: 70 (03/04/22 1645)  BP (Non-Invasive): 120/61 (03/04/22 1645)  Respiratory Rate: 17 (03/04/22 1645)  SpO2: 93 % (03/04/22 1645)    No notable events documented.    Patient is sufficiently recovered from the effects of anesthesia to participate in the evaluation and has returned to their pre-procedure level.  Patient location during evaluation: PACU       Patient participation: complete - patient participated  Level of consciousness: responsive to verbal stimuli    Pain management: adequate  Airway patency: patent    Anesthetic complications: no  Cardiovascular status: acceptable  Respiratory status: acceptable  Hydration status: acceptable  Patient post-procedure temperature: Pt Normothermic   PONV Status: Absent

## 2022-03-04 NOTE — Anesthesia Preprocedure Evaluation (Signed)
ANESTHESIA PRE-OP EVALUATION  Planned Procedure: INSERTION NAIL TROCHANTERIC FEMUR (Left)  Review of Systems         patient summary reviewed  nursing notes reviewed        Pulmonary     Cardiovascular    Hyperlipidemia ,No peripheral edema,        GI/Hepatic/Renal           Endo/Other    osteoarthritis,      Neuro/Psych/MS        Cancer                    Physical Assessment      Airway       Mallampati: II      Mouth Opening: poor.            Dental                    Pulmonary    Breath sounds clear to auscultation  (-) no rhonchi, no decreased breath sounds, no wheezes, no rales and no stridor     Cardiovascular    Rhythm: regular  Rate: Normal  (-) no friction rub, carotid bruit is not present, no peripheral edema and no murmur     Other findings          Plan  ASA 3     Planned anesthesia type: general     general anesthesia with laryngeal mask airway

## 2022-03-05 LAB — CBC WITH DIFF
BASOPHIL #: <0.1 10*3/uL (ref <=0.20)
BASOPHIL %: 0 %
EOSINOPHIL #: <0.1 10*3/uL (ref <=0.50)
EOSINOPHIL %: 0 %
HCT: 39.4 % (ref 34.8–46.0)
HGB: 12.4 g/dL (ref 11.5–16.0)
IMMATURE GRANULOCYTE #: <0.1 10*3/uL (ref <0.10)
IMMATURE GRANULOCYTE %: 0 % (ref 0.0–1.0)
LYMPHOCYTE #: 1.04 10*3/uL (ref 1.00–4.80)
LYMPHOCYTE %: 8 %
MCH: 27.8 pg (ref 26.0–32.0)
MCHC: 31.5 g/dL (ref 31.0–35.5)
MCV: 88.3 fL (ref 78.0–100.0)
MONOCYTE #: 1.02 10*3/uL (ref 0.20–1.10)
MONOCYTE %: 8 %
MPV: 10.5 fL (ref 8.7–12.5)
NEUTROPHIL #: 11.1 10*3/uL — ABNORMAL HIGH (ref 1.50–7.70)
NEUTROPHIL %: 84 %
PLATELETS: 164 10*3/uL (ref 150–400)
RBC: 4.46 10*6/uL (ref 3.85–5.22)
RDW-CV: 12.7 % (ref 11.5–15.5)
WBC: 13.3 10*3/uL — ABNORMAL HIGH (ref 3.7–11.0)

## 2022-03-05 LAB — BASIC METABOLIC PANEL
ANION GAP: 11 mmol/L (ref 4–13)
BUN/CREA RATIO: 23 — ABNORMAL HIGH (ref 6–22)
BUN: 14 mg/dL (ref 8–25)
CALCIUM: 8.8 mg/dL (ref 8.6–10.3)
CHLORIDE: 108 mmol/L (ref 96–111)
CO2 TOTAL: 21 mmol/L — ABNORMAL LOW (ref 23–31)
CREATININE: 0.6 mg/dL (ref 0.60–1.05)
ESTIMATED GFR - FEMALE: 88 mL/min/BSA (ref >=60)
GLUCOSE: 72 mg/dL (ref 65–125)
POTASSIUM: 3.9 mmol/L (ref 3.5–5.1)
SODIUM: 140 mmol/L (ref 136–145)

## 2022-03-05 MED ORDER — ACETAMINOPHEN 325 MG TABLET
650.0000 mg | ORAL_TABLET | ORAL | Status: DC | PRN
Start: 2022-03-05 — End: 2022-03-07
  Administered 2022-03-05 – 2022-03-07 (×6): 650 mg via ORAL
  Filled 2022-03-05 (×6): qty 2

## 2022-03-05 MED ORDER — ASPIRIN 81 MG TABLET,DELAYED RELEASE
81.0000 mg | DELAYED_RELEASE_TABLET | Freq: Two times a day (BID) | ORAL | Status: DC
Start: 2022-03-05 — End: 2022-03-07
  Administered 2022-03-05 – 2022-03-07 (×5): 81 mg via ORAL
  Filled 2022-03-05 (×5): qty 1

## 2022-03-05 NOTE — Progress Notes (Addendum)
[name]  Hospitalist Progress Note    Sheri Garza       87 y.o.       Date of service: 03/05/2022  Date of Admission:  03/03/2022    Hospital Day:  LOS: 2 days   CC: No chief complaint on file.      Subjective:  Patient sitting up in chair with family at bedside.  Voices some pain and is now agreeable to Tylenol but does not want IV.  Discussed need for DVT prophylaxis following hip surgery and discussed options-patient is comfortable with aspirin 81 b.i.d.    Objective:   Filed Vitals:    03/04/22 1700 03/04/22 1955 03/05/22 0549 03/05/22 0700   BP: (!) 106/56 124/64 132/75 100/61   Pulse: 86 89 95 (!) 103   Resp: 16 14 18 18   $ Temp: 36.6 C (97.9 F) 36.4 C (97.5 F) 36.8 C (98.2 F) 36.1 C (97 F)   SpO2: 94% 92% 96% 97%     I have reviewed the vitals.    Physical Exam:  Constitutional: Patient is in no acute distress.  Appears younger than stated age, thin  HEENT: Head normocephalic and atraumatic.   Lungs: Clear to auscultation bilaterally without wheezes, rales or rhonchi.  Cardiovascular: Regular rate and rhythm.   Abdomen: Soft, nontender, nondistended. No rebound, Guarding, or masses noted.   MSK/Extremities: Moves all extremities.  Dressing to left leg dry and intact, mild edema  Skin: No lesions noted.  No rashes.  Neuro: Normal sensation and strength bilaterally. Normal gait. Normal coordination. CNs 2-12 grossly intact. No facial droop noted.    Psych: Alert and oriented to person, place, and time. Normal affect    Labs:  Lab Results Today:    Results for orders placed or performed during the hospital encounter of 03/03/22 (from the past 24 hour(s))   BASIC METABOLIC PANEL   Result Value Ref Range    SODIUM 140 136 - 145 mmol/L    POTASSIUM 3.9 3.5 - 5.1 mmol/L    CHLORIDE 108 96 - 111 mmol/L    CO2 TOTAL 21 (L) 23 - 31 mmol/L    ANION GAP 11 4 - 13 mmol/L    CALCIUM 8.8 8.6 - 10.3 mg/dL    GLUCOSE 72 65 - 125 mg/dL    BUN 14 8 - 25 mg/dL    CREATININE 0.60 0.60 - 1.05 mg/dL     BUN/CREA RATIO 23 (H) 6 - 22    ESTIMATED GFR - FEMALE 88 >=60 mL/min/BSA   CBC WITH DIFF   Result Value Ref Range    WBC 13.3 (H) 3.7 - 11.0 x10^3/uL    RBC 4.46 3.85 - 5.22 x10^6/uL    HGB 12.4 11.5 - 16.0 g/dL    HCT 39.4 34.8 - 46.0 %    MCV 88.3 78.0 - 100.0 fL    MCH 27.8 26.0 - 32.0 pg    MCHC 31.5 31.0 - 35.5 g/dL    RDW-CV 12.7 11.5 - 15.5 %    PLATELETS 164 150 - 400 x10^3/uL    MPV 10.5 8.7 - 12.5 fL    NEUTROPHIL % 84.0 %    LYMPHOCYTE % 8.0 %    MONOCYTE % 8.0 %    EOSINOPHIL % 0.0 %    BASOPHIL % 0.0 %    NEUTROPHIL # 11.10 (H) 1.50 - 7.70 x10^3/uL    LYMPHOCYTE # 1.04 1.00 - 4.80 x10^3/uL    MONOCYTE # 1.02  0.20 - 1.10 x10^3/uL    EOSINOPHIL # <0.10 <=0.50 x10^3/uL    BASOPHIL # <0.10 <=0.20 x10^3/uL    IMMATURE GRANULOCYTE % 0.0 0.0 - 1.0 %    IMMATURE GRANULOCYTE # <0.10 <0.10 x10^3/uL     I have reviewed all labs.    Micro: No results found for any visits on 03/03/22 (from the past 96 hour(s)).    Radiology:    Results for orders placed or performed during the hospital encounter of 03/03/22 (from the past 24 hour(s))   FLUORO HIP PROCEDURE IN OR LEFT     Status: None    Narrative    *Procedure not read by radiology.    *Please Refer to Procedure Note for result.       Assessment/ Plan:   Active Hospital Problems    Diagnosis    Primary Problem: Closed left hip fracture, initial encounter (CMS HCC)     Mechanical fall  Left hip intertrochanteric fracture:   S/P ORIF of left hip 03/04/2022  Orthopedics following  Aspirin 81 mg b.i.d. for DVT prophylaxis x1 month  PT consult-recommending inpatient rehab  Tylenol for pain-patient refusing any stronger pain medication at this time     Osteoporosis:   Was not able to tolerate Prolia in the past     Macular degeneration:   Keratoconjunctivitis sicca:   On AERDS2 multivitamin daily currently not available in formulary  Only uses preservative-free ocular lubricant due to allergy which is not available in the pharmacy-patient brought in home med       DVT/PE  Prophylaxis:  Aspirin 81 mg b.i.d.    Disposition Planning: Skilled Nursing Unit     Jonah Blue, FNP-BC

## 2022-03-05 NOTE — OR Surgeon (Signed)
PATIENT NAME: Sheri Garza NUMBER:  [id number]  DATE OF SERVICE: 03/04/2022  DATE OF BIRTH:  April 09, 1935    OPERATIVE REPORT    PREOPERATIVE DIAGNOSIS:  Left intertrochanteric hip fracture.    POSTOPERATIVE DIAGNOSIS:  Left intertrochanteric hip fracture.    NAME OF PROCEDURE:  Open reduction and internal fixation of a left intertrochanteric hip fracture with an intramedullary device.    SURGEON:  Wendelyn Breslow, MD.    ASSISTANT:  Karle Barr, PA-C.    ANESTHESIA:  General endotracheal.    ESTIMATED BLOOD LOSS:  Minimal.    COMPLICATIONS:  None.    INDICATIONS FOR PROCEDURE:  This is a pleasant female who fell in a parking lot and sustained the above-mentioned deformity.  Operative and nonoperative interventions were explained in detail.  Informed consent was obtained.    DESCRIPTION OF PROCEDURE:  The patient was brought to the operating room and placed supine on the fracture table.  After appropriate reduction of the left intertrochanteric hip fracture, the left hip area was prepped and draped in a standard surgical fashion.  At this point, internal fixation was performed using an Biomet Affixus left hip intertrochanteric nail.  An incision was made over the gluteus medius area and dissection was carried down.  The trochanteric region was reamed, and at this point, an Affixus size 9 nail with a size 100 lag screw and a size 32 distal locking screw was used to rigidly stabilize the hip.  Fluoroscopy verified anatomic reduction and alignment.  All wounds were copiously irrigated and closed with 3-0 Vicryl and staples.  A sterile dressing was applied.  The patient was sent to the recovery room in good condition.  I, Dr. Wendelyn Breslow, was physically present for throughout the entire case and assisted by Karle Barr, PA-C, throughout the entire procedure.     ASSISTANT PARTICIPATION AND MEDICAL NECESSITY: The nature of this procedure required an assistant for the successful completion of this surgery.  The presence of the PA-C/Assistant is utilized for crucial parts of this procedure such as incision, retraction, closure and an additional pair of skilled hands used throughout the entire surgical process. I was present for the entire case.    The patient will be weightbearing as tolerated.  We will get her into physical therapy.  We will assess her needs at home.  I will see her back in the office in 3 weeks.  At that point, we will get x-rays, 2 views, of the left hip and we will remove the staples.  She will be in the hospital until she is medically cleared for discharge to home.  Anticoagulation will be per the hospitalist service.        Wendelyn Breslow, MD                DD:  03/04/2022 20:50:51  DT:  03/05/2022 06:51:31 KF  D#:  [id number]

## 2022-03-05 NOTE — Discharge Instructions (Addendum)
Discharge Recommendations/ Plan:Discharge to:SNF Placement (Medicare certified) (code 3)      Resources: Nursing to call report to [facility] at [phone]. Fax [phone]

## 2022-03-05 NOTE — Care Plan (Signed)
[facility]  Physical Therapy Initial Evaluation    Patient Name: Sheri Garza  Date of Birth: 11/14/1935  Height: Height: 149.9 cm (4' 11"$ )  Weight: Weight: 45.8 kg (100 lb 15.5 oz)  Room/Bed: 5335/A  Payor: MEDICARE / Plan: MEDICARE PART A AND B / Product Type: Medicare /     Assessment:      Pt. required Mod A.-CGA asst for STS to [place]. Pt. performed approx. 3 STS and one 1/2 STS during session. Pt. primarily limited by pain and weakness. Pt. moderately unsteady when standing, requiring asst. Pt. able to slightly weight shift with asst. at [facility]. Pt. unable to march at [facility] due to pain and weakness. Therefore, ambulation not trialed. Pt. performed TE and given HEP packet. Pt. verbalized and/or demonstrated understanding. Pt. PLOF is indep. in ADLs and ambulating w/ a cane. Recommend d/c to SNF.    Discharge Needs:    Equipment Recommendation: front wheeled walker        Discharge Disposition: skilled nursing facility    [place]   Based on current diagnosis, functional performance prior to admission, and current functional performance, this patient requires continued PT services in skilled nursing facility in order to achieve significant functional improvements in these deficit areas: ROM (range of motion), neuromuscular, muscle performance, joint integrity and mobility, gait, locomotion, and balance, ergonomics and body mechanics, motor function.        Plan:   Current Intervention: transfer training, stretching, strengthening, stair training, ROM (range of motion), patient/family education, neuromuscular re-education, manual therapy techniques, home exercise program, gait training, bed mobility training, balance training  To provide physical therapy services 1x/day, minimum of 5x/week  for duration of until discharge.    The risks/benefits of therapy have been discussed with the patient/caregiver and he/she is in agreement with the established  plan of care.       Subjective & Objective        03/05/22 1118   Therapist Pager   PT Assigned/ Pager # Lowell Bouton, SPT [phone]   Rehab Session   Document Type evaluation   Total PT Minutes: 31  [phone])   Patient Effort good   Symptoms Noted During/After Treatment increased pain   General Information   Patient Profile Reviewed yes   Onset of Illness/Injury or Date of Surgery 03/03/22   Patient/Family/Caregiver Comments/Observations Pt. agreeable to PT.   Pertinent History of Current Functional Problem Pt. admitted for closed left hip fracture. Pt. underwent L hip ORIF on 2/21. PMHx: DDD, hypertriglyceridemia, lumbar spinal stenosis, osteoarthrosis, osteoporosis, thyroid nodule.   Respiratory Status room air   Existing Precautions/Restrictions fall precautions   Mutuality/Individual Preferences   Individualized Care Needs Mod A   Plan of Care Reviewed With patient   Living Environment   Lives With alone   Living Arrangements house   Home Accessibility stairs to enter home;stairs within home   Living Environment Comment Pt. reports living in 2 story home w/ 2 steps to enter and 10 steps to 2nd floor but can live on first floor.   Stairs Within Home, Primary   Stairs, Within Home, Primary 10   Home Main Entrance   Number of Stairs, Main Entrance two   Functional Level Prior   Ambulation 1 - assistive equipment   Transferring 1 - assistive equipment   Toileting 0 - independent   Bathing 0 - independent   Dressing 0 - independent   Eating 0 - independent   Communication  0 - understands/communicates without difficulty   Prior Functional Level Comment Pt. reports using cane for ambulation and being indep. in ADLs.   Self-Care   Usual Activity Tolerance moderate   Current Activity Tolerance poor   Pre Treatment Status   Pre Treatment Patient Status Patient sitting in bedside chair or w/c   Support Present Pre Treatment  Family present;Nurse present   Communication Pre Treatment  Nurse   Cognitive Assessment/Interventions    Behavior/Mood Observations behavior appropriate to situation, WNL/WFL   Orientation Status oriented x 4   Attention WNL/WFL   Follows Commands WNL   Pain Assessment   Pretreatment Pain Rating 9/10   Pain Location - Side Left   Pain Location - Orientation generalized   Pain Location knee   Pre/Posttreatment Pain Comment Pt. reports hip did not hurt too much but quad and knee area hurt.   RUE Assessment   RUE Assessment WFL for stated baseline   LUE Assessment   LUE Assessment WFL for stated baseline   RLE Assessment   RLE Assessment WFL for stated baseline   LLE Assessment   LLE Assessment X-Exceptions   LLE ROM L Hip: 0-80 PROM   LLE Other Difficult to assess due to pain and weakness.   Trunk Assessment   Trunk Assessment WFL for stated baseline   Bed Mobility Assessment/Treatment   Supine-Sit Independence not tested   Transfer Assessment/Treatment   Sit-Stand Independence moderate assist (50% patient effort);contact guard assist   Stand-Sit Independence moderate assist (50% patient effort);contact guard assist   Sit-Stand-Sit, Assist Device walker, front wheeled   Transfer Impairments balance impaired;ROM decreased;pain;strength decreased;motor control impaired   Transfer Comment Pt. required Mod A.-CGA asst. when standing to FWW. Pt. primarily limited by pain and weakness. Pt. moderately unsteady when standing, requiring asst. Pt. able to slightly weight shift with asst. at [facility]. Pt. unable to march at [facility] due to pain and weakness. Therefore, ambulation not trialed.   Gait Assessment/Treatment   Comment Attempted gait by PT assisting with weight shifts and step instruction. However pt not able to tolerate due to pain   Balance Skill Training   Sitting Balance: Static fair + balance   Sitting, Dynamic (Balance) fair balance   Sit-to-Stand Balance poor + balance   Standing Balance: Static poor + balance   Standing Balance: Dynamic poor - balance   Therapeutic Exercise/Activity   Comment Bilateral LE: ankle pumps x  10. LLE: LAQ x 10, quad sets w/ 3s hold x 10, glute sets w/ 3s hold x10.   Post Treatment Status   Post Treatment Patient Status Patient sitting in bedside chair or w/c;Telephone within reach;Call light within reach   Support Present Post Treatment  Family present   Plan of Care Review   Plan Of Care Reviewed With patient;son   Functional Impairment   Overall Functional Impairments/Problem List balance impaired;strength decreased;ROM decreased;pain;motor control impaired   Physical Therapy Clinical Impression   Assessment Pt. required Mod A.-CGA asst for STS to [place]. Pt. performed approx. 3 STS and one 1/2 STS during session. Pt. primarily limited by pain and weakness. Pt. moderately unsteady when standing, requiring asst. Pt. able to slightly weight shift with asst. at [facility]. Pt. unable to march at [facility] due to pain and weakness. Therefore, ambulation not trialed. Pt. performed TE and given HEP packet. Pt. verbalized and/or demonstrated understanding. Pt. PLOF is indep. in ADLs and ambulating w/ a cane. Recommend d/c to SNF.   Criteria for Skilled Therapeutic skilled treatment  is necessary   Pathology/Pathophysiology Noted musculoskeletal;neuromuscular   Impairments Found (describe specific impairments) ROM (range of motion);neuromuscular;muscle performance;joint integrity and mobility;gait, locomotion, and balance;ergonomics and body mechanics;motor function   Functional Limitations in Following  self-care;home management;community/leisure   Rehab Potential good   Therapy Frequency 1x/day;minimum of 5x/week   Predicted Duration of Therapy Intervention (days/wks) until discharge   Anticipated Equipment Needs at Discharge (PT) front wheeled walker   Anticipated Discharge Disposition skilled nursing facility   Evaluation Complexity Justification   Patient History: Co-morbidity/factors that impact Plan of Care 3 or more that impact Plan of Care   Examination Components 3 or more Exam elements addressed   Presentation  Evolving: Symptoms, complaints, characteristics of condition changing &/or cognitive deficits present   Clinical Decision Making Moderate complexity   Evaluation Complexity Moderate complexity   Care Plan Goals   PT Rehab Goals Physical Therapy Goal;Bed Mobility Goal;Transfer Training Goal;Stairs Training Goal;Gait Training Goal   Physical Therapy Goal   PT  Goal, Date Established 03/05/22   PT Goal, Time to Achieve by discharge   PT Goal, Activity Type HEP   PT Goal, Independence Level independent   Bed Mobility Goal   Bed Mobility Goal, Date Established 03/05/22   Bed Mobility Goal, Time to Achieve by discharge   Bed Mobility Goal, Activity Type all bed mobility activities   Bed Mobility Goal, Independence Level modified independence   Bed Mobility Goal, Assistive Device least restrictive assistive device   Gait Training  Goal, Distance to Achieve   Gait Training  Goal, Date Established 03/05/22   Gait Training  Goal, Time to Achieve by discharge   Gait Training  Goal, Independence Level modified independence   Gait Training  Goal, Assist Device least restricted assistive device   Gait Training  Goal, Distance to Achieve 50'   Stairs Training Goal   Stairs Training Goal, Date Established 03/05/22   Stairs Training Goal, Time to Achieve by discharge   Stairs Training Goal, Independence Level modified independence   Stairs Training Goal, Assist Device least restrictive assistive device   Stairs Training Goal, Number of Stairs to Achieve 2   Transfer Training Goal   Transfer Training Goal, Date Established 03/05/22   Transfer Training Goal, Time to Achieve by discharge   Transfer Training Goal, Activity Type all transfers   Transfer Training Goal, Independence Level modified independence   Transfer Training Goal, Assist Device least restrictrictive assistive device   Planned Therapy Interventions, PT Eval   Planned Therapy Interventions (PT) transfer training;stretching;strengthening;stair training;ROM (range of  motion);patient/family education;neuromuscular re-education;manual therapy techniques;home exercise program;gait training;bed mobility training;balance training   [place] Relationship/Rapport care explained       Therapist:   Cedric Fishman, PT 03/05/2022 11:43  Pager #: [phone]  Treatment by Lowell Bouton, SPT

## 2022-03-05 NOTE — Care Plan (Signed)
Problem: Adult Inpatient Plan of Care  Goal: Absence of Hospital-Acquired Illness or Injury  Outcome: Ongoing (see interventions/notes)  Goal: Rounds/Family Conference  Outcome: Ongoing (see interventions/notes)     Problem: Skin Injury Risk Increased  Goal: Skin Health and Integrity  Outcome: Ongoing (see interventions/notes)     Problem: Fall Injury Risk  Goal: Absence of Fall and Fall-Related Injury  Outcome: Ongoing (see interventions/notes)

## 2022-03-05 NOTE — Nurses Notes (Signed)
Pt is alert and orient X1-2 after returning from PACU. Pt then remained asleep for the most of shift. Pts IV was dry and intact. Pt complained of no pain during shift. Pt was free from falls during shift. Pts vitals were stable during shift. Pt is currently resting in bed. I have reviewed the plan of care. Will continue to monitor.    BP 124/64   Pulse 89   Temp 36.4 C (97.5 F)   Resp 14   Ht 1.499 m (4' 11"$ )   Wt 45.8 kg (100 lb 15.5 oz)   SpO2 92%   BMI 20.39 kg/m     Court Joy, LPN

## 2022-03-05 NOTE — Care Management Notes (Signed)
[facility] Management Initial Evaluation    Patient Name: Sheri Garza  Date of Birth: 1935-03-14  Sex: female  Date/Time of Admission: 03/03/2022  9:21 PM  Room/Bed: 5335/A  Payor: MEDICARE / Plan: MEDICARE PART A AND B / Product Type: Medicare /   Primary Care Providers:  [place], [place] (General)    Pharmacy Info:   Preferred Pharmacy       [id number] [address]   [address]   Phone: [phone]: [phone]   Hours: Not open 24 hours    [place] [address]    [address] [place]   Phone: [phone]: [phone]   Hours: Not open 24 hours          Emergency Contact Info:   Extended Emergency Contact Information  Primary Emergency Contact: [place]: [phone] Work Phone: [phone] Mobile Phone: [phone] Relation: Son    History:   NKatriel Warzechais a 89y.o., female, admitted with Closed left hip fracture.    Height/Weight: 149.9 cm (4' 11"$ ) / 45.8 kg (100 lb 15.5 oz)     LOS: 2 days   Admitting Diagnosis: Closed left hip fracture, initial encounter (CMS HVelma [S72.002A]    Assessment:      03/05/22 1640   Assessment Details   Assessment Type Admission   Readmission   Is this a readmission? No   Insurance Information/Type   Insurance type Medicare   Medicare Intent to Discharge Documentation   Admit IMM given to: Patient   Admit IMM letter given date 03/03/22   Admit IMM letter time given 2200   Employment/Financial   Patient has Prescription Coverage?  Yes        Name of Insurance Coverage for Medications Medicare / Health Plan   Financial Concerns none   Living Environment   Lives With alone   Living Arrangements house   Able to Return to Prior Arrangements yes   Home Safety   Home Assessment: No Problems Identified   Home Accessibility no concerns;bed and bath on same level   Care  Management Plan   Discharge Planning Status initial meeting   Discharge plan discussed with: Patient;MPOA   Discharge Needs Assessment   Equipment Currently Used at Home cane, straight   Equipment Needed After Discharge other (see comments)  (FWW)   Discharge Facility/Level of Care Needs SNF Placement (Medicare certified)(code 3)   Transportation Available family or friend will provide   Referral Information   Admission Type inpatient   Address Verified verified-no changes   Arrived From home or self-care   ADVANCE DIRECTIVES   Does the Patient have an Advance Directive? Yes, Patient Does Have Advance Directive for Healthcare Treatment   Type of Advance Directive Completed Medical Power of Attorney   Copy of Advance Directives in Chart? No, Copy Requested From Patient   Name of [place]  Phone Number of [place] [facility] [phone]  Patient Requests Assistance in Having Advance Directive Notarized. N/A   LAY CAREGIVER    Appointed Lay Caregiver? I Decline     Patient is a 87year old female admitted with Closed left hip fracture. Workup includes; Labs, Imaging, Ortho consult, pain mgt., OR and PT  eval.    Discharge Plan:  SNF Placement (Medicare certified) (code 3)  Met with patient and MPOA, Jesi Goldhammer, [phone] at bedside for initial assessment. Patient lives alone in a 2 story house with 2 steps to enter. Bed & bath on same level. Requested a copy of MPOA listing Royanne Marrison as Primary. Patient has a cane at home.No prior [place] services. [facility] seen at the [place] [place] [facility] 3 months ago. Transportation to be provided by family upon discharge. PT recommending FWW and SNF.    Spoke to patient and Aaron Edelman regarding recommendations FOC signed for [facility]. Referral sent via [facility]. Report number placed in AVS.    The patient will continue to be evaluated for developing discharge needs.     Case Manager: Breck Coons, CASE  MANAGER  Phone: [phone]

## 2022-03-06 DIAGNOSIS — M810 Age-related osteoporosis without current pathological fracture: Secondary | ICD-10-CM

## 2022-03-06 DIAGNOSIS — H3530 Unspecified macular degeneration: Secondary | ICD-10-CM

## 2022-03-06 DIAGNOSIS — M3501 Sicca syndrome with keratoconjunctivitis: Secondary | ICD-10-CM

## 2022-03-06 DIAGNOSIS — W19XXXA Unspecified fall, initial encounter: Secondary | ICD-10-CM

## 2022-03-06 LAB — BASIC METABOLIC PANEL
ANION GAP: 7 mmol/L (ref 4–13)
BUN/CREA RATIO: 25 — ABNORMAL HIGH (ref 6–22)
BUN: 15 mg/dL (ref 8–25)
CALCIUM: 8.7 mg/dL (ref 8.6–10.3)
CHLORIDE: 106 mmol/L (ref 96–111)
CO2 TOTAL: 27 mmol/L (ref 23–31)
CREATININE: 0.6 mg/dL (ref 0.60–1.05)
ESTIMATED GFR - FEMALE: 88 mL/min/BSA (ref >=60)
GLUCOSE: 98 mg/dL (ref 65–125)
POTASSIUM: 4 mmol/L (ref 3.5–5.1)
SODIUM: 140 mmol/L (ref 136–145)

## 2022-03-06 LAB — CBC WITH DIFF
BASOPHIL #: <0.1 10*3/uL (ref <=0.20)
BASOPHIL %: 0 %
EOSINOPHIL #: 0.13 10*3/uL (ref <=0.50)
EOSINOPHIL %: 1 %
HCT: 38.3 % (ref 34.8–46.0)
HGB: 11.9 g/dL (ref 11.5–16.0)
IMMATURE GRANULOCYTE #: <0.1 10*3/uL (ref <0.10)
IMMATURE GRANULOCYTE %: 0 % (ref 0.0–1.0)
LYMPHOCYTE #: 1.18 10*3/uL (ref 1.00–4.80)
LYMPHOCYTE %: 10 %
MCH: 27.5 pg (ref 26.0–32.0)
MCHC: 31.1 g/dL (ref 31.0–35.5)
MCV: 88.7 fL (ref 78.0–100.0)
MONOCYTE #: 1.34 10*3/uL — ABNORMAL HIGH (ref 0.20–1.10)
MONOCYTE %: 11 %
MPV: 11 fL (ref 8.7–12.5)
NEUTROPHIL #: 9.48 10*3/uL — ABNORMAL HIGH (ref 1.50–7.70)
NEUTROPHIL %: 78 %
PLATELETS: 148 10*3/uL — ABNORMAL LOW (ref 150–400)
RBC: 4.32 10*6/uL (ref 3.85–5.22)
RDW-CV: 12.5 % (ref 11.5–15.5)
WBC: 12.2 10*3/uL — ABNORMAL HIGH (ref 3.7–11.0)

## 2022-03-06 NOTE — Care Plan (Signed)
Problem: Adult Inpatient Plan of Care  Goal: Absence of Hospital-Acquired Illness or Injury  Intervention: Identify and Manage Fall Risk  Recent Flowsheet Documentation  Taken 03/06/2022 0412 by Geralynn Rile, LPN  Safety Promotion/Fall Prevention:   activity supervised   fall prevention program maintained   muscle strengthening facilitated   motion sensor pad activated   nonskid shoes/slippers when out of bed   safety round/check completed  Intervention: Prevent Skin Injury  Recent Flowsheet Documentation  Taken 03/06/2022 0200 by Geralynn Rile, LPN  Body Position: supine, head elevated  Taken 03/06/2022 0000 by Geralynn Rile, LPN  Body Position: side lying, right  Taken 03/05/2022 2300 by Geralynn Rile, LPN  Skin Protection:   adhesive use limited   incontinence pads utilized   tubing/devices free from skin contact  Taken 03/05/2022 2200 by Geralynn Rile, LPN  Body Position: supine, head elevated  Taken 03/05/2022 2038 by Geralynn Rile, LPN  Skin Protection:   adhesive use limited   incontinence pads utilized   tubing/devices free from skin contact  Taken 03/05/2022 2000 by Geralynn Rile, LPN  Body Position: supine, head elevated  Intervention: Prevent and Manage VTE (Venous Thromboembolism) Risk  Recent Flowsheet Documentation  Taken 03/06/2022 0330 by Geralynn Rile, LPN  VTE Prevention/Management:   ambulation promoted   anticoagulant therapy maintained   dorsiflexion/plantar flexion performed  Taken 03/05/2022 2335 by Geralynn Rile, LPN  VTE Prevention/Management:   ambulation promoted   dorsiflexion/plantar flexion performed  Taken 03/05/2022 2132 by Geralynn Rile, LPN  VTE Prevention/Management:   ambulation promoted   anticoagulant therapy maintained   dorsiflexion/plantar flexion performed  Goal: Optimal Comfort and Wellbeing  Intervention: Provide Person-Centered Care  Recent Flowsheet Documentation  Taken 03/05/2022 2038 by Geralynn Rile, LPN  Trust Relationship/Rapport:   care explained    choices provided   empathic listening provided   questions answered   questions encouraged   reassurance provided   thoughts/feelings acknowledged

## 2022-03-06 NOTE — Care Plan (Signed)
Patient has rested on and off throughout shift. Up with use of sara steady. PRN tylenol administered for c/o pain. Vital signs stable. I have reviewed this patient's orders and plan of care.  Currently this patient meets requirements for a low to mid level of nursing care.    Renard Hamper, RN    Problem: Adult Inpatient Plan of Care  Goal: Plan of Care Review  Outcome: Ongoing (see interventions/notes)  Goal: Patient-Specific Goal (Individualized)  Outcome: Ongoing (see interventions/notes)  Goal: Absence of Hospital-Acquired Illness or Injury  Outcome: Ongoing (see interventions/notes)  Intervention: Identify and Manage Fall Risk  Recent Flowsheet Documentation  Taken 03/06/2022 1300 by Renard Hamper, RN  Safety Promotion/Fall Prevention: safety round/check completed  Taken 03/06/2022 1100 by Renard Hamper, RN  Safety Promotion/Fall Prevention: safety round/check completed  Taken 03/06/2022 0840 by Renard Hamper, RN  Safety Promotion/Fall Prevention:   safety round/check completed   nonskid shoes/slippers when out of bed   fall prevention program maintained   activity supervised  Intervention: Prevent Skin Injury  Recent Flowsheet Documentation  Taken 03/06/2022 0840 by Renard Hamper, RN  Body Position: supine, head elevated  Skin Protection:   adhesive use limited   incontinence pads utilized  Intervention: Prevent and Manage VTE (Venous Thromboembolism) Risk  Recent Flowsheet Documentation  Taken 03/06/2022 0840 by Renard Hamper, RN  VTE Prevention/Management:   ambulation promoted   anticoagulant therapy maintained   dorsiflexion/plantar flexion performed  Goal: Optimal Comfort and Wellbeing  Outcome: Ongoing (see interventions/notes)  Intervention: Provide Person-Centered Care  Recent Flowsheet Documentation  Taken 03/06/2022 0840 by Renard Hamper, RN  Trust Relationship/Rapport: care explained  Goal: Rounds/Family Conference  Outcome: Ongoing (see interventions/notes)     Problem: Pain Acute  Goal: Optimal Pain  Control and Function  Outcome: Ongoing (see interventions/notes)  Intervention: Optimize Psychosocial Wellbeing  Recent Flowsheet Documentation  Taken 03/06/2022 0840 by Renard Hamper, RN  Supportive Measures: active listening utilized     Problem: Fall Injury Risk  Goal: Absence of Fall and Fall-Related Injury  Outcome: Ongoing (see interventions/notes)  Intervention: Identify and Manage Contributors  Recent Flowsheet Documentation  Taken 03/06/2022 0840 by Renard Hamper, RN  Self-Care Promotion: independence encouraged  Intervention: Promote Injury-Free Environment  Recent Flowsheet Documentation  Taken 03/06/2022 1300 by Renard Hamper, RN  Safety Promotion/Fall Prevention: safety round/check completed  Taken 03/06/2022 1100 by Renard Hamper, RN  Safety Promotion/Fall Prevention: safety round/check completed  Taken 03/06/2022 0840 by Renard Hamper, RN  Safety Promotion/Fall Prevention:   safety round/check completed   nonskid shoes/slippers when out of bed   fall prevention program maintained   activity supervised     Problem: Fall Injury Risk  Goal: Absence of Fall and Fall-Related Injury  Outcome: Ongoing (see interventions/notes)  Intervention: Identify and Manage Contributors  Recent Flowsheet Documentation  Taken 03/06/2022 0840 by Renard Hamper, RN  Self-Care Promotion: independence encouraged  Intervention: Promote Injury-Free Environment  Recent Flowsheet Documentation  Taken 03/06/2022 1300 by Renard Hamper, RN  Safety Promotion/Fall Prevention: safety round/check completed  Taken 03/06/2022 1100 by Renard Hamper, RN  Safety Promotion/Fall Prevention: safety round/check completed  Taken 03/06/2022 0840 by Renard Hamper, RN  Safety Promotion/Fall Prevention:   safety round/check completed   nonskid shoes/slippers when out of bed   fall prevention program maintained   activity supervised     Problem: Skin Injury Risk Increased  Goal: Skin Health and Integrity  Outcome: Ongoing (see  interventions/notes)  Intervention: Optimize Skin Protection  Recent Flowsheet Documentation  Taken  03/06/2022 0840 by Renard Hamper, RN  Pressure Reduction Techniques: Frequent weight shifting encouraged  Pressure Reduction Devices: Repositioning wedges/pillows utilized  Skin Protection:   adhesive use limited   incontinence pads utilized  Activity Management: ROM, active encouraged     Problem: Orthopaedic Fracture  Goal: Absence of Bleeding  Outcome: Ongoing (see interventions/notes)  Goal: Effective Bowel Elimination  Outcome: Ongoing (see interventions/notes)  Goal: Absence of Embolism Signs and Symptoms  Outcome: Ongoing (see interventions/notes)  Intervention: Prevent or Manage Embolism Risk  Recent Flowsheet Documentation  Taken 03/06/2022 0840 by Renard Hamper, RN  VTE Prevention/Management:   ambulation promoted   anticoagulant therapy maintained   dorsiflexion/plantar flexion performed  Goal: Fracture Stability  Outcome: Ongoing (see interventions/notes)  Goal: Optimal Functional Ability  Outcome: Ongoing (see interventions/notes)  Intervention: Optimize Functional Ability  Recent Flowsheet Documentation  Taken 03/06/2022 0840 by Renard Hamper, RN  Self-Care Promotion: independence encouraged  Activity Management: ROM, active encouraged  Goal: Absence of Infection Signs and Symptoms  Outcome: Ongoing (see interventions/notes)  Goal: Effective Tissue Perfusion  Outcome: Ongoing (see interventions/notes)  Goal: Optimal Pain Control and Function  Outcome: Ongoing (see interventions/notes)  Goal: Effective Oxygenation and Ventilation  Outcome: Ongoing (see interventions/notes)  Intervention: Promote Airway Secretion Clearance  Recent Flowsheet Documentation  Taken 03/06/2022 0840 by Renard Hamper, RN  Cough And Deep Breathing: done independently per patient  Activity Management: ROM, active encouraged

## 2022-03-06 NOTE — Progress Notes (Signed)
[facility]  Hospitalist Progress Note    Sheri Garza       87 y.o.       Date of service: 03/06/2022  Date of Admission:  03/03/2022    Hospital Day:  LOS: 3 days   CC: No chief complaint on file.      Subjective:  Patient seen and examined.  Sitting up in bed doing exercises.  Still having left hip pain but states Tylenol helps some.  Does not want anything stronger.  Discussed plan for rehab once bed available-hopefully tomorrow.    Objective:   Filed Vitals:    03/06/22 0037 03/06/22 0700 03/06/22 0915 03/06/22 0940   BP: (!) 114/40 (!) 118/57 (!) 110/51    Pulse: 95 (!) 105 100    Resp: '18 18 16 18   '$ Temp: 36.9 C (98.4 F) 36.1 C (97 F) 36.8 C (98.2 F)    SpO2: 97% 100% 98%      I have reviewed the vitals.    Physical Exam:  Constitutional: Patient is in no acute distress.  Appears younger than stated age, thin  HEENT: Head normocephalic and atraumatic.   Lungs: Clear to auscultation bilaterally without wheezes, rales or rhonchi.  Cardiovascular: Regular rate and rhythm.   Abdomen: Soft, nontender, nondistended. No rebound, Guarding, or masses noted.   MSK/Extremities: Moves all extremities.  Dressing to left leg dry and intact, edema noted to left hip, tender  Skin: No lesions noted.  No rashes.  Neuro: CNs 2-12 grossly intact. No facial droop noted.    Psych: Alert and oriented to person, place, and time. Normal affect    Labs:  Lab Results Today:    Results for orders placed or performed during the hospital encounter of 03/03/22 (from the past 24 hour(s))   BASIC METABOLIC PANEL   Result Value Ref Range    SODIUM 140 136 - 145 mmol/L    POTASSIUM 4.0 3.5 - 5.1 mmol/L    CHLORIDE 106 96 - 111 mmol/L    CO2 TOTAL 27 23 - 31 mmol/L    ANION GAP 7 4 - 13 mmol/L    CALCIUM 8.7 8.6 - 10.3 mg/dL    GLUCOSE 98 65 - 125 mg/dL    BUN 15 8 - 25 mg/dL    CREATININE 0.60 0.60 - 1.05 mg/dL    BUN/CREA RATIO 25 (H) 6 - 22    ESTIMATED GFR - FEMALE 88 >=60 mL/min/BSA   CBC WITH DIFF   Result Value Ref Range     WBC 12.2 (H) 3.7 - 11.0 x10^3/uL    RBC 4.32 3.85 - 5.22 x10^6/uL    HGB 11.9 11.5 - 16.0 g/dL    HCT 38.3 34.8 - 46.0 %    MCV 88.7 78.0 - 100.0 fL    MCH 27.5 26.0 - 32.0 pg    MCHC 31.1 31.0 - 35.5 g/dL    RDW-CV 12.5 11.5 - 15.5 %    PLATELETS 148 (L) 150 - 400 x10^3/uL    MPV 11.0 8.7 - 12.5 fL    NEUTROPHIL % 78.0 %    LYMPHOCYTE % 10.0 %    MONOCYTE % 11.0 %    EOSINOPHIL % 1.0 %    BASOPHIL % 0.0 %    NEUTROPHIL # 9.48 (H) 1.50 - 7.70 x10^3/uL    LYMPHOCYTE # 1.18 1.00 - 4.80 x10^3/uL    MONOCYTE # 1.34 (H) 0.20 - 1.10 x10^3/uL    EOSINOPHIL # 0.13 <=  0.50 x10^3/uL    BASOPHIL # <0.10 <=0.20 x10^3/uL    IMMATURE GRANULOCYTE % 0.0 0.0 - 1.0 %    IMMATURE GRANULOCYTE # <0.10 <0.10 x10^3/uL     I have reviewed all labs.    Micro: No results found for any visits on 03/03/22 (from the past 96 hour(s)).    Radiology:           Assessment/ Plan:   Active Hospital Problems    Diagnosis    Primary Problem: Closed left hip fracture, initial encounter (CMS HCC)     Mechanical fall  Left hip intertrochanteric fracture:   S/P ORIF of left hip 03/04/2022  Orthopedics following  Aspirin 81 mg b.i.d. for DVT prophylaxis x1 month  PT consult-recommending inpatient rehab  Tylenol for pain-patient refusing any stronger pain medication      Osteoporosis:   Was not able to tolerate Prolia in the past     Macular degeneration:   Keratoconjunctivitis sicca:   On AERDS2 multivitamin daily currently not available in formulary  Only uses preservative-free ocular lubricant due to allergy which is not available in the pharmacy-patient brought in home med       DVT/PE Prophylaxis:  Aspirin 81 mg b.i.d.    Disposition Planning: Skilled Nursing Unit     Jonah Blue, FNP-BC

## 2022-03-06 NOTE — Care Plan (Signed)
[facility]  Physical Therapy Progress Note    Patient Name: Sheri Garza  Date of Birth: March 13, 1935  Height:  149.9 cm ('4\' 11"'$ )  Weight:  45.8 kg (100 lb 15.5 oz)  Room/Bed: 5335/A  Payor: MEDICARE / Plan: MEDICARE PART A AND B / Product Type: Medicare /     Assessment:     Gait Assessment:   Gait Assessment/Treatment  Independence : (P) minimum assist (75% patient effort), verbal cues required  Assistive Device : (P) walker, front wheeled  Distance in Feet: (P) 2 very small steps forwards forwards, 2 steps retro  Gait Speed: (P) slow, step to  Deviations : (P) cadence decreased, step length decreased, stride length decreased, swing-to-stance ratio decreased, toe-to-floor clearance decreased, weight-shifting ability decreased  Safety Issues : (P) step length decreased, weight-shifting ability decreased  Impairments : (P) balance impaired, pain, ROM decreased, postural control impaired, strength decreased  Comment: (P) Cues needed for proper LE/AD sequencing to offload L LE. Pt will require a youth walker for ambulation. Will take a youth walker next session. Significant increased pain reported following ambulation.    (P) Pt reports significant pain with weight bearing only. Pt currently requiring Mod A for bed mobility and Min A to stand at [facility]. Still needs STEDY for transfers to the r/r. Pt was also able to perform SPT from bed > bs chair with Min A. Pt able to progress to gait training with FWW. She took 2 very small steps forwards forwards, 2 steps retro. Cues needed for proper LE/AD sequencing to offload L LE. Pt will require a youth walker for ambulation. Will take a youth walker next session. Significant increased pain reported following ambulation. Pt is 87 y/o and lives alone. Recommend SNF prior to DC home.    Discharge Needs:   Equipment Recommendation: (P) front wheeled walker (YOUTH)    Discharge Disposition: skilled nursing facility  Plan:   Continue to follow  patient according to established plan of care.  The risks/benefits of therapy have been discussed with the patient/caregiver and he/she is in agreement with the established plan of care.     Subjective & Objective:        03/06/22 1334   Therapist Pager   PT Assigned/ Pager # [phone] Pina   Rehab Session   Document Type therapy progress note (daily note)   Total PT Minutes: 38   Patient Effort good   Symptoms Noted During/After Treatment increased pain;fatigue  (pt only taking tylenol for pain, has not had pain meds since 9am this morning)   General Information   Patient Profile Reviewed yes   Patient/Family/Caregiver Comments/Observations Pt reports significant pain with weight bearing only.   Medical Lines PIV Line   Respiratory Status room air   Existing Precautions/Restrictions fall precautions;full code   Pre Treatment Status   Pre Treatment Patient Status Patient supine in bed;Call light within reach;Telephone within reach;Patient safety alarm activated;Nurse approved session   Support Present Pre Treatment  Family present  (son)   Cognitive Assessment/Interventions   Behavior/Mood Observations behavior appropriate to situation, WNL/WFL;distractible;impulsive   Orientation Status oriented x 4   Attention WNL/WFL   Follows Commands WNL   Comment Pt a little impulsive, she tried to stand without assistance even though I advised her not to.   Vital Signs   O2 Delivery Pre Treatment room air   O2 Delivery Post Treatment room air   Vitals Comment vss   Pain Assessment   Additional Documentation  FACES Pain Scale, Pre/Post Treatment (Group);Numbers Pain Scale, Pre/Post Treatment (Group)   Pain Intervention  Medication (see eMAR)   Pain Location - Side Left   Pain Location - Orientation generalized   Pain Location hip   Pre/Posttreatment Pain Comment Did not rate pain. Pt's pain increases in sitting with hip flexion   Bed Mobility Assessment/Treatment   Bed Mobility, Assistive Device bed rails;Head of Bed Elevated    Supine-Sit Independence moderate assist (50% patient effort);minimum assist (75% patient effort)   Sit to Supine, Independence not tested   Safety Issues decreased use of legs for bridging/pushing   Impairments pain;flexibility decreased;strength decreased   Comment Pt currently requiring Mod A trunk and B LE support.   Transfer Assessment/Treatment   Sit-Stand Independence minimum assist (75% patient effort);verbal cues required   Stand-Sit Independence minimum assist (75% patient effort);verbal cues required   Sit-Stand-Sit, Assist Device walker, front wheeled   Transfer Safety Issues step length decreased;weight-shifting ability decreased   Transfer Impairments pain;ROM decreased;strength decreased   Transfer Comment Pt required Min A to stand at [facility] today. Needs STEDY for transfers to the r/r. Pt was also able to perform SPT from bed > bs chair with Min A.   Gait Assessment/Treatment   Independence  minimum assist (75% patient effort);verbal cues required   Assistive Device  walker, front wheeled   Distance in Feet 2 very small steps forwards forwards, 2 steps retro   Gait Speed slow, step to   Deviations  cadence decreased;step length decreased;stride length decreased;swing-to-stance ratio decreased;toe-to-floor clearance decreased;weight-shifting ability decreased   Safety Issues  step length decreased;weight-shifting ability decreased   Impairments  balance impaired;pain;ROM decreased;postural control impaired;strength decreased   Comment Cues needed for proper LE/AD sequencing to offload L LE. Pt will require a youth walker for ambulation. Will take a youth walker next session. Significant increased pain reported following ambulation.   Motor Skills/Interventions   Additional Documentation Therapeutic Exercise (Group)   Post Treatment Status   Post Treatment Patient Status Patient sitting in bedside chair or w/c;Call light within reach;Telephone within reach;Patient safety alarm activated   Support Present  Post Treatment  Family present   Plan of Care Review   Plan Of Care Reviewed With patient;family   Physical Therapy Clinical Impression   Assessment Pt reports significant pain with weight bearing only. Pt currently requiring Mod A for bed mobility and Min A to stand at [facility]. Still needs STEDY for transfers to the r/r. Pt was also able to perform SPT from bed > bs chair with Min A. Pt able to progress to gait training with FWW. She took 2 very small steps forwards forwards, 2 steps retro. Cues needed for proper LE/AD sequencing to offload L LE. Pt will require a youth walker for ambulation. Will take a youth walker next session. Significant increased pain reported following ambulation. Pt is 87 y/o and lives alone. Recommend SNF prior to DC home.   Anticipated Equipment Needs at Discharge (PT) front wheeled walker  (YOUTH)       Therapist:   Marcos Eke, PTA   Pager #: [phone]

## 2022-03-07 ENCOUNTER — Encounter (INDEPENDENT_AMBULATORY_CARE_PROVIDER_SITE_OTHER)
Admit: 2022-03-07 | Discharge: 2022-03-07 | Disposition: A | Discharge status: Home / Self Care | Payer: Self-pay | Attending: Physical Medicine & Rehabilitation | Admitting: Physical Medicine & Rehabilitation

## 2022-03-07 DIAGNOSIS — D62 Acute posthemorrhagic anemia: Secondary | ICD-10-CM

## 2022-03-07 LAB — CBC WITH DIFF
BASOPHIL #: <0.1 10*3/uL (ref <=0.20)
BASOPHIL %: 0 %
EOSINOPHIL #: 0.19 10*3/uL (ref <=0.50)
EOSINOPHIL %: 2 %
HCT: 35.9 % (ref 34.8–46.0)
HGB: 11.1 g/dL — ABNORMAL LOW (ref 11.5–16.0)
IMMATURE GRANULOCYTE #: <0.1 10*3/uL (ref <0.10)
IMMATURE GRANULOCYTE %: 0 % (ref 0.0–1.0)
LYMPHOCYTE #: 1.28 10*3/uL (ref 1.00–4.80)
LYMPHOCYTE %: 13 %
MCH: 27.5 pg (ref 26.0–32.0)
MCHC: 30.9 g/dL — ABNORMAL LOW (ref 31.0–35.5)
MCV: 88.9 fL (ref 78.0–100.0)
MONOCYTE #: 1.09 10*3/uL (ref 0.20–1.10)
MONOCYTE %: 11 %
MPV: 11 fL (ref 8.7–12.5)
NEUTROPHIL #: 7.32 10*3/uL (ref 1.50–7.70)
NEUTROPHIL %: 74 %
PLATELETS: 164 10*3/uL (ref 150–400)
RBC: 4.04 10*6/uL (ref 3.85–5.22)
RDW-CV: 12.7 % (ref 11.5–15.5)
WBC: 9.9 10*3/uL (ref 3.7–11.0)

## 2022-03-07 MED ORDER — TRAMADOL 50 MG TABLET
50.0000 mg | ORAL_TABLET | Freq: Four times a day (QID) | ORAL | Status: DC | PRN
Start: 2022-03-07 — End: 2022-03-07

## 2022-03-07 MED ORDER — ASPIRIN 81 MG TABLET,DELAYED RELEASE
81.0000 mg | DELAYED_RELEASE_TABLET | Freq: Two times a day (BID) | ORAL | Status: DC
Start: 2022-03-07 — End: 2022-04-05

## 2022-03-07 MED ORDER — ACETAMINOPHEN 325 MG TABLET
650.0000 mg | ORAL_TABLET | ORAL | Status: DC | PRN
Start: 2022-03-07 — End: 2022-04-05

## 2022-03-07 MED ORDER — TRAMADOL 50 MG TABLET
50.0000 mg | ORAL_TABLET | Freq: Four times a day (QID) | ORAL | Status: DC | PRN
Start: 2022-03-07 — End: 2022-04-05

## 2022-03-07 NOTE — Care Plan (Signed)
[facility] [place]  Physical Therapy Progress Note    Patient Name: Sheri Garza  Date of Birth: [birth year]  Height:  149.9 cm ('4\' 11"'$ )  Weight:  45.8 kg (100 lb 15.5 oz)  Room/Bed: 5335/A  Payor: MEDICARE / Plan: MEDICARE PART A AND B / Product Type: Medicare /     Assessment:     Gait Assessment:   Gait Assessment/Treatment  Independence : (P) verbal cues required, minimum assist (75% patient effort), contact guard assist  Assistive Device : (P) walker, front wheeled  Distance in Feet: (P) 15 ft  Gait Speed: (P) slow, step to  Deviations : (P) cadence decreased, step length decreased, weight-shifting ability decreased  Safety Issues : (P) step length decreased, weight-shifting ability decreased  Impairments : (P) balance impaired, pain, postural control impaired, ROM decreased, strength decreased (severe kyphosis)  Comment: (P) Pt able to progress with ambulation distance today. In this morning's session pt ambulated ~ 15 ft with FWW, Min A/CGA. Pt ambulates with severe kyphotic posture and antalgic gait pattern. Needs cues for postural control and proper LE sequencing to offload L LE.    (P) Pt easily agreeable to therapy session today. She says, "I'm in a really bad mood today, they told me there were not any beds availible at [facility] and no one has been working with me here. I have just been laying in bed like a turtle on my back" Pt refuses to take anything other than tylenol for pain. She was able to progress with ambulation distance today. In this morning's session pt ambulated ~ 15 ft with FWW, Min A/CGA. Pt ambulates with severe kyphotic posture and antalgic gait pattern. Needs cues for postural control and proper LE sequencing to offload L LE. Pt is 87 y/o and lives alone. Recommend SNF prior to DC home.    Discharge Needs:   Equipment Recommendation: front wheeled walker (YOUTH)    Discharge Disposition: skilled nursing facility  Plan:   Continue to follow patient according  to established plan of care.  The risks/benefits of therapy have been discussed with the patient/caregiver and he/she is in agreement with the established plan of care.     Subjective & Objective:        03/07/22 1001   Therapist Pager   PT Assigned/ Pager # [phone]   Rehab Session   Document Type therapy progress note (daily note)   Patient Effort good   Symptoms Noted During/After Treatment increased pain;fatigue   General Information   Patient Profile Reviewed yes   Patient/Family/Caregiver Comments/Observations Pt says, "I'm in a really bad mood today, they told me there are not any beds availible at [facility] and no one has been working with me here. I have just been laying in bed like a turtle on my back"  Pt refuses to take anything other than tylenol for pain.   Medical Lines PIV Line   Respiratory Status room air   Existing Precautions/Restrictions fall precautions;full code   Weight-bearing Status   Extremity Weight-bearing Status left lower extremity   Left Lower Extremity weight-bearing as tolerated (WBAT)   Pre Treatment Status   Pre Treatment Patient Status Patient supine in bed;Call light within reach;Telephone within reach;Patient safety alarm activated;Nurse approved session   Support Present Pre Treatment  Family present  (son)   Cognitive Assessment/Interventions   Behavior/Mood Observations behavior appropriate to situation, WNL/WFL;irritable;impulsive   Orientation Status oriented x 4   Attention WNL/WFL   Follows Commands WNL  Vital Signs   O2 Delivery Pre Treatment room air   O2 Delivery Post Treatment room air   Vitals Comment vss   Pain Assessment   Additional Documentation FACES Pain Scale, Pre/Post Treatment (Group);Numbers Pain Scale, Pre/Post Treatment (Group)   Pain Intervention  Medication (see eMAR)   Pretreatment Pain Rating 2/10   Posttreatment Pain Rating 8/10   Pain Location - Side Left   Pain Location - Orientation generalized   Pain Location hip   Bed Mobility Assessment/Treatment    Bed Mobility, Assistive Device bed rails;Head of Bed Elevated   Supine-Sit Independence minimum assist (75% patient effort)   Sit to Supine, Independence minimum assist (75% patient effort)   Safety Issues decreased use of legs for bridging/pushing   Impairments pain;strength decreased   Transfer Assessment/Treatment   Sit-Stand Independence minimum assist (75% patient effort);verbal cues required   Stand-Sit Independence minimum assist (75% patient effort);verbal cues required   Sit-Stand-Sit, Assist Device walker, front wheeled   Transfer Safety Issues step length decreased;weight-shifting ability decreased   Transfer Impairments pain;ROM decreased;strength decreased   Transfer Comment Still needs cueing for proper technique, poor safety carryover noted.   Gait Assessment/Treatment   Independence  verbal cues required;minimum assist (75% patient effort);contact guard assist   Assistive Device  walker, front wheeled   Distance in Feet 15 ft   Gait Speed slow, step to   Deviations  cadence decreased;step length decreased;weight-shifting ability decreased   Safety Issues  step length decreased;weight-shifting ability decreased   Impairments  balance impaired;pain;postural control impaired;ROM decreased;strength decreased  (severe kyphosis)   Comment Pt able to progress with ambulation distance today. In this morning's session pt ambulated ~ 15 ft with FWW, Min A/CGA. Pt ambulates with severe kyphotic posture and antalgic gait pattern. Needs cues for postural control and proper LE sequencing to offload L LE.   Motor Skills/Interventions   Additional Documentation Therapeutic Exercise (Group)   Therapeutic Exercise/Activity   Comment Pt says she has been working on exercises but could not show me any exercises I showed her yesterday. She is able to SAQ and LAQ on her own   Lower Extremity Range of Motion left;hip flexion/extension;knee flexion/extension;ankle dorsiflexion/plantarflexion   Lower Extremity ankle  pumps;hamstring sets, left;heel slides, left;LAQ (long arc quad), left;quad sets, left;SAQ (short arc quad), left   Position  seated;supine   Exercise Type AROM (active range of motion);isometric contraction, static;isotonic contraction, concentric;isotonic contraction, eccentric   Post Treatment Status   Post Treatment Patient Status Patient sitting in bedside chair or w/c;Call light within reach;Telephone within reach;Patient safety alarm activated   Support Present Post Treatment  None   Plan of Care Review   Plan Of Care Reviewed With patient   Physical Therapy Clinical Impression   Assessment Pt easily agreeable to therapy session today. She says, "I'm in a really bad mood today, they told me there were not any beds availible at [facility] and no one has been working with me here. I have just been laying in bed like a turtle on my back" Pt refuses to take anything other than tylenol for pain. She was able to progress with ambulation distance today. In this morning's session pt ambulated ~ 15 ft with FWW, Min A/CGA. Pt ambulates with severe kyphotic posture and antalgic gait pattern. Needs cues for postural control and proper LE sequencing to offload L LE. Pt is 87 y/o and lives alone. Recommend SNF prior to DC home.       Therapist:   Caryl Pina  Melina [place], [place]   Pager #: [phone]

## 2022-03-07 NOTE — Progress Notes (Addendum)
[facility]  Hospitalist Progress Note    Sheri Garza       87 y.o.       Date of service: 03/07/2022  Date of Admission:  03/03/2022    Hospital Day:  LOS: 4 days   CC: No chief complaint on file.      Subjective:  Patient sitting up in chair with son at bedside.  Still having considerable pain which is worse with movement.  Also reports difficulty sleeping due to pain.  Discussed adding Ultram p.r.n. and patient is agreeable but states Tylenol is working "some." Discussed that she would likely be able to participate in rehab more if pain was better controlled and she agrees.    Objective:   Filed Vitals:    03/06/22 1920 03/07/22 0002 03/07/22 0735 03/07/22 1100   BP: (!) 126/58 (!) 145/66 (!) 115/56    Pulse: (!) 108 (!) 112 98    Resp: '16 16 17 18   '$ Temp: 36.2 C (97.1 F) 36.4 C (97.5 F) 36.5 C (97.7 F)    SpO2: 95% 100% 100%      I have reviewed the vitals.    Physical Exam:  Constitutional: Patient is in no acute distress.  Appears younger than stated age, thin  HEENT: Head normocephalic and atraumatic.   Lungs: Clear to auscultation bilaterally without wheezes, rales or rhonchi.  Cardiovascular: Regular rate and rhythm.   Abdomen: Soft, nontender, nondistended. No rebound, Guarding, or masses noted.   MSK/Extremities: Moves all extremities.  Dressing to left leg dry and intact, edema noted to left hip, tender  Skin: No lesions noted.  No rashes.  Neuro: CNs 2-12 grossly intact. No facial droop noted.    Psych: Alert and oriented to person, place, and time. Normal affect    Labs:  Lab Results Today:    Results for orders placed or performed during the hospital encounter of 03/03/22 (from the past 24 hour(s))   CBC WITH DIFF   Result Value Ref Range    WBC 9.9 3.7 - 11.0 x10^3/uL    RBC 4.04 3.85 - 5.22 x10^6/uL    HGB 11.1 (L) 11.5 - 16.0 g/dL    HCT 35.9 34.8 - 46.0 %    MCV 88.9 78.0 - 100.0 fL    MCH 27.5 26.0 - 32.0 pg    MCHC 30.9 (L) 31.0 - 35.5 g/dL    RDW-CV 12.7 11.5 - 15.5 %     PLATELETS 164 150 - 400 x10^3/uL    MPV 11.0 8.7 - 12.5 fL    NEUTROPHIL % 74.0 %    LYMPHOCYTE % 13.0 %    MONOCYTE % 11.0 %    EOSINOPHIL % 2.0 %    BASOPHIL % 0.0 %    NEUTROPHIL # 7.32 1.50 - 7.70 x10^3/uL    LYMPHOCYTE # 1.28 1.00 - 4.80 x10^3/uL    MONOCYTE # 1.09 0.20 - 1.10 x10^3/uL    EOSINOPHIL # 0.19 <=0.50 x10^3/uL    BASOPHIL # <0.10 <=0.20 x10^3/uL    IMMATURE GRANULOCYTE % 0.0 0.0 - 1.0 %    IMMATURE GRANULOCYTE # <0.10 <0.10 x10^3/uL     I have reviewed all labs.    Micro: No results found for any visits on 03/03/22 (from the past 96 hour(s)).    Radiology:           Assessment/ Plan:   Active Hospital Problems    Diagnosis    Primary Problem: Closed left hip fracture,  initial encounter (CMS [facility])     Mechanical fall  Left hip intertrochanteric fracture:   S/P ORIF of left hip 03/04/2022  Orthopedics following  Aspirin 81 mg b.i.d. for DVT prophylaxis x1 month  PT consult-recommending inpatient rehab  Tylenol for pain  Pain appears to be hindering physical therapy.  Will add Ultram and encouraged patient to try a dose prior to next session    Acute blood loss anemia-mild  Secondary to surgery  Monitor labs     Osteoporosis:   Was not able to tolerate Prolia in the past     Macular degeneration:   Keratoconjunctivitis sicca:   On AERDS2 multivitamin daily currently not available in formulary  Only uses preservative-free ocular lubricant due to allergy which is not available in the pharmacy-patient brought in home med       DVT/PE Prophylaxis:  Aspirin 81 mg b.i.d.    Disposition Planning: Skilled Nursing Unit     Jonah Blue, FNP-BC

## 2022-03-07 NOTE — Nurses Notes (Signed)
Patient rang call light, she is upset and wondering when she will be transported to [facility]. EMS was contacted to see if they could give an ETA, they said they were unable to give ETA. Unit secretary contacting other EMS companies to attempt to get patient transferred. Patient and son notified of this.  Renard Hamper, RN

## 2022-03-07 NOTE — Discharge Summary (Signed)
DISCHARGE SUMMARY      PATIENT NAME:  Sheri Garza, Sheri Garza  MRN:  [id number]  DOB:  1935-12-16    ADMISSION DATE:  03/03/2022  DISCHARGE DATE:  03/07/2022    ATTENDING PHYSICIAN:Graves, Lennox Grumbles, MD  SERVICE: [facility] HOSPITALIST 3  PRIMARY CARE PHYSICIAN: [place]     Reason for Admission       Diagnosis        Closed left hip fracture, initial encounter (CMS [facility]) [id number]            DISCHARGE DIAGNOSIS:     Principal Problem:  Closed left hip fracture, initial encounter (CMS [facility])    Active Hospital Problems    Diagnosis Date Noted    Principal Problem: Closed left hip fracture, initial encounter (CMS [facility]) [S72.002A] 03/03/2022      Resolved Hospital Problems   No resolved problems to display.     Active Non-Hospital Problems    Diagnosis Date Noted    Back pain     DDD (degenerative disc disease), cervical     Insomnia     Kyphoscoliosis and scoliosis     Lumbar spinal stenosis     Osteoarthrosis     Avitaminosis D 09/14/2018    Thyroid nodule 04/01/2016    Spinal stenosis 04/01/2016    Hyperlipidemia 04/01/2016    Right Knee OA 09/21/2012      Allergies   Allergen Reactions    Antihistamine [Diphenhydramine Hcl] Swelling    Penicillins      Rash, Dizziness    Vancomycin  Other Adverse Reaction (Add comment)     Phlebitis    Flagyl [Metronidazole]     Gentamicin     Iodine And Iodide Containing Products     Lidocaine      Except xylocaine. Ok taking that    Other      Antihistamines and all caines, Pt states Xylocaine is ok    Tetracycline         INVESTIGATIONS:  Labs:  CBC (Last 24 Hours):    Recent Results last 24 hours     03/07/22  0421   WBC 9.9   HGB 11.1*   HCT 35.9   MCV 88.9   PLTCNT 164         No results for input(s): "MAGNESIUM", "PHOSPHORUS" in the last 24 hours.  No results for input(s): "INR", "APTT" in the last 24 hours.  No results found for: "UHCEASTTROPI"      Imaging:         DISCHARGE MEDICATIONS:     Current Discharge Medication List        START taking these medications.         Details   acetaminophen 325 mg Tablet  Commonly known as: TYLENOL   650 mg, Oral, EVERY 4 HOURS PRN  Refills: 0     aspirin 81 mg Tablet, Delayed Release (E.C.)  Commonly known as: ECOTRIN   81 mg, Oral, 2 TIMES DAILY  Refills: 0     traMADoL 50 mg Tablet  Commonly known as: ULTRAM   50 mg, Oral, EVERY 6 HOURS PRN  Refills: 0            CONTINUE these medications - NO CHANGES were made during your visit.        Details   Optive Sensitive (PF) 0.5-0.9 % Dropperette  Generic drug: Carboxymethylcell-Glycerin(PF)   1 Drop, Both Eyes, DAILY  Refills: 0  Discharge med list refreshed?  YES        DISCHARGE INSTRUCTIONS:   Follow-up Information       [facility]. Schedule an appointment as soon as possible for a visit.    Specialty: EXTERNAL  Contact information:  [address]  [place] [address]  [facility] .    Specialty: Orthopaedics  Contact information:  [address] [place] [phone]  [phone]  Additional information:  From the [place]:  Take [address] [place], [address], [address], at end of exit ramp, turn right, at first traffic light turn left into hospital complex.  The clinic will be on your left in [facility].   From the [place]: Take [address] [place], [address], [address], at end of exit ramp, turn left, cross over [place] bridge, at second traffic light turn left into hospital complex.  The clinic will be    on your left in the [facility] building.                            DISCHARGE INSTRUCTION - DIET     Diet: RESUME HOME DIET      DISCHARGE INSTRUCTION - ACTIVITY     Activity: AS TOLERATED      FOLLOW-UP: [facility] - MEDICAL OFFICE [place], [place]     Follow-up in: 3 WEEKS    Reason for visit: POST-OP VISIT    Workman's Comp Claim: No    Provider: Other [place] COURSE:  This is a 87 y.o., female with history of osteoporosis, hyperlipidemia, macular generation.  Presented to emergency department with complaints of left hip pain after suffering a mechanical fall.  Imaging showing left intertrochanteric fracture and patient was transferred to [facility] for orthopedic evaluation.  Underwent ORIF of the left hip on 03/04/2022.  Patient was started on aspirin 81 mg b.i.d. for DVT prophylaxis which will need to be continued for 1 month postop.  Patient has worked with physical therapy but pain seems to be hindering physical therapy.  Patient is very hesitant to take any pain medication.  Was finally agreeable with Tylenol which she states helps some but is still having considerable pain.  Discussed possibly trying tramadol and she is unsure whether to try or not.  She also developed mild acute blood loss anemia secondary to surgery but hemoglobin is stable.  Physical therapy evaluated and recommending inpatient re[facility] [place].  Patient stable for discharge.    Mechanical fall  Left hip intertrochanteric fracture:   S/P ORIF of left hip 03/04/2022  Orthopedics following  Aspirin 81 mg b.i.d. for DVT prophylaxis x1 month  PT consult-recommending inpatient rehab  Tylenol for pain  Pain appears to be hindering physical therapy.  Will add Ultram and encouraged patient to try a dose prior to next session     Acute blood loss anemia-mild  Secondary to surgery  Monitor labs     Osteoporosis:   Was not able to tolerate Prolia in the past     Macular degeneration:   Keratoconjunctivitis sicca:   On AERDS2 multivitamin  daily currently not available in formulary  Only uses preservative-free ocular lubricant due to allergy which is not available in the pharmacy-patient brought in home med            CONDITION ON DISCHARGE: Improved and stable.      DISCHARGE DISPOSITION:  Skilled Nursing Unit              Jonah Blue, FNP-BC  03/07/2022, 13:58    Copies sent to Care Team         Relationship Specialty Notifications Start End    [facility] PCP - General EXTERNAL  07/11/20     Phone: [phone] Fax: [phone]         [address] [place] [address]            Referring providers can utilize [email or web address] access their referred [facility] patient's information.

## 2022-03-07 NOTE — Nurses Notes (Signed)
Report called to [facility], RN at [facility], all questions answered.   Renard Hamper, RN

## 2022-03-07 NOTE — Care Management Notes (Addendum)
[place] Management Note    Patient Name: Sheri Garza  Date of Birth: 1935/11/03  Sex: female  Date/Time of Admission: 03/03/2022  9:21 PM  Room/Bed: 5335/A  Payor: MEDICARE / Plan: MEDICARE PART A AND B / Product Type: Medicare /    LOS: 4 days   Primary Care Providers:  [facility] [place] (General)    Admitting Diagnosis:  Closed left hip fracture, initial encounter (CMS [facility]) [S72.002A]    Assessment:      03/07/22 0932   Assessment Details   Assessment Type Continued Assessment   Care Management Plan   Discharge Planning Status plan in progress   Discharge plan discussed with: Patient   Discharge Needs Assessment   Discharge Facility/Level of Care Needs Acute Rehab Placement/Return (not psych)(code 62)         Discharge Plan:  Acute Rehab Placement/Return (not psych) (code 62)  No beds at [facility]. Spoke to patient. Referral sent to [facility] per request.    1310-Patient to discharge to [facility] today via EMS. Waiting on admit time from [facility], [facility] liaison.    The patient will continue to be evaluated for developing discharge needs.     Case Manager: Breck Coons, CASE MANAGER  Phone: [phone]

## 2022-03-07 NOTE — Nurses Notes (Signed)
Patient updated that [facility] is on their way to transport her to [facility]. Patient states understanding.   Renard Hamper, RN

## 2022-03-07 NOTE — Nurses Notes (Signed)
Patient discharged to [facility] with instructions for follow-up care. Discharge summary faxed to facilty. A copy of the AVS, MAR, and [facility] summary sent to facility with patient. Bedside nurse to call report.  Alphia Moh, RN

## 2022-03-08 ENCOUNTER — Other Ambulatory Visit: Payer: Medicare Other | Attending: Physical Medicine & Rehabilitation | Admitting: Physical Medicine & Rehabilitation

## 2022-03-08 LAB — COMPREHENSIVE METABOLIC PANEL, NON-FASTING
ALBUMIN: 2.5 g/dL — ABNORMAL LOW (ref 3.4–4.8)
ALKALINE PHOSPHATASE: 85 U/L (ref 55–145)
ALT (SGPT): 12 U/L (ref 8–22)
ANION GAP: 7 mmol/L (ref 4–13)
AST (SGOT): 17 U/L (ref 8–45)
BILIRUBIN TOTAL: 0.6 mg/dL (ref 0.3–1.3)
BUN/CREA RATIO: 19 (ref 6–22)
BUN: 11 mg/dL (ref 8–25)
CALCIUM: 9 mg/dL (ref 8.6–10.3)
CHLORIDE: 106 mmol/L (ref 96–111)
CO2 TOTAL: 25 mmol/L (ref 23–31)
CREATININE: 0.59 mg/dL — ABNORMAL LOW (ref 0.60–1.05)
ESTIMATED GFR - FEMALE: 89 mL/min/BSA (ref >=60)
GLUCOSE: 89 mg/dL (ref 65–125)
POTASSIUM: 3.9 mmol/L (ref 3.5–5.1)
PROTEIN TOTAL: 5.6 g/dL — ABNORMAL LOW (ref 6.0–8.0)
SODIUM: 138 mmol/L (ref 136–145)

## 2022-03-08 LAB — CBC/DIFF - CLIENT CONSOLIDATED
BASOPHIL #: <0.1 10*3/uL (ref <=0.20)
BASOPHIL %: 0.4 %
EOSINOPHIL #: 0.31 10*3/uL (ref <=0.50)
EOSINOPHIL %: 4.1 %
HCT: 34.3 % — ABNORMAL LOW (ref 34.8–46.0)
HGB: 11 g/dL — ABNORMAL LOW (ref 11.5–16.0)
IMMATURE GRANULOCYTE #: <0.1 10*3/uL (ref <0.10)
IMMATURE GRANULOCYTE %: 0.3 % (ref 0.0–1.0)
LYMPHOCYTE #: 1.56 10*3/uL (ref 1.00–4.80)
LYMPHOCYTE %: 20.7 %
MCH: 28.4 pg (ref 26.0–32.0)
MCHC: 32.1 g/dL (ref 31.0–35.5)
MCV: 88.4 fL (ref 78.0–100.0)
MONOCYTE #: 0.68 10*3/uL (ref 0.20–1.10)
MONOCYTE %: 9 %
MPV: 10.9 fL (ref 8.7–12.5)
NEUTROPHIL #: 4.95 10*3/uL (ref 1.50–7.70)
NEUTROPHIL %: 65.5 %
PLATELETS: 172 10*3/uL (ref 150–400)
RBC: 3.88 10*6/uL (ref 3.85–5.22)
RDW-CV: 13.1 % (ref 11.5–15.5)
WBC: 7.6 10*3/uL (ref 3.7–11.0)

## 2022-03-08 LAB — CBC WITH DIFF
BASOPHIL #: <0.1 10*3/uL (ref <=0.20)
BASOPHIL %: 0.4 %
EOSINOPHIL #: 0.31 10*3/uL (ref <=0.50)
EOSINOPHIL %: 4.1 %
HCT: 34.3 % — ABNORMAL LOW (ref 34.8–46.0)
HGB: 11 g/dL — ABNORMAL LOW (ref 11.5–16.0)
IMMATURE GRANULOCYTE #: <0.1 10*3/uL (ref <0.10)
IMMATURE GRANULOCYTE %: 0.3 % (ref 0.0–1.0)
LYMPHOCYTE #: 1.56 10*3/uL (ref 1.00–4.80)
LYMPHOCYTE %: 20.7 %
MCH: 28.4 pg (ref 26.0–32.0)
MCHC: 32.1 g/dL (ref 31.0–35.5)
MCV: 88.4 fL (ref 78.0–100.0)
MONOCYTE #: 0.68 10*3/uL (ref 0.20–1.10)
MONOCYTE %: 9 %
MPV: 10.9 fL (ref 8.7–12.5)
NEUTROPHIL #: 4.95 10*3/uL (ref 1.50–7.70)
NEUTROPHIL %: 65.5 %
PLATELETS: 172 10*3/uL (ref 150–400)
RBC: 3.88 10*6/uL (ref 3.85–5.22)
RDW-CV: 13.1 % (ref 11.5–15.5)
WBC: 7.6 10*3/uL (ref 3.7–11.0)

## 2022-03-08 LAB — GOLD TOP TUBE

## 2022-03-10 ENCOUNTER — Other Ambulatory Visit: Payer: Medicare Other | Attending: Physical Medicine & Rehabilitation | Admitting: Physical Medicine & Rehabilitation

## 2022-03-10 LAB — CBC/DIFF - CLIENT CONSOLIDATED
BASOPHIL #: <0.1 10*3/uL (ref <=0.20)
BASOPHIL %: 0.5 %
EOSINOPHIL #: 0.37 10*3/uL (ref <=0.50)
EOSINOPHIL %: 5.1 %
HCT: 35.8 % (ref 34.8–46.0)
HGB: 11.4 g/dL — ABNORMAL LOW (ref 11.5–16.0)
IMMATURE GRANULOCYTE #: <0.1 10*3/uL (ref <0.10)
IMMATURE GRANULOCYTE %: 0.3 % (ref 0.0–1.0)
LYMPHOCYTE #: 1.4 10*3/uL (ref 1.00–4.80)
LYMPHOCYTE %: 19.2 %
MCH: 28.3 pg (ref 26.0–32.0)
MCHC: 31.8 g/dL (ref 31.0–35.5)
MCV: 88.8 fL (ref 78.0–100.0)
MONOCYTE #: 0.87 10*3/uL (ref 0.20–1.10)
MONOCYTE %: 11.9 %
MPV: 10.7 fL (ref 8.7–12.5)
NEUTROPHIL #: 4.6 10*3/uL (ref 1.50–7.70)
NEUTROPHIL %: 63 %
PLATELETS: 237 10*3/uL (ref 150–400)
RBC: 4.03 10*6/uL (ref 3.85–5.22)
RDW-CV: 13 % (ref 11.5–15.5)
WBC: 7.3 10*3/uL (ref 3.7–11.0)

## 2022-03-10 LAB — CBC WITH DIFF
BASOPHIL #: <0.1 10*3/uL (ref <=0.20)
BASOPHIL %: 0.5 %
EOSINOPHIL #: 0.37 10*3/uL (ref <=0.50)
EOSINOPHIL %: 5.1 %
HCT: 35.8 % (ref 34.8–46.0)
HGB: 11.4 g/dL — ABNORMAL LOW (ref 11.5–16.0)
IMMATURE GRANULOCYTE #: <0.1 10*3/uL (ref <0.10)
IMMATURE GRANULOCYTE %: 0.3 % (ref 0.0–1.0)
LYMPHOCYTE #: 1.4 10*3/uL (ref 1.00–4.80)
LYMPHOCYTE %: 19.2 %
MCH: 28.3 pg (ref 26.0–32.0)
MCHC: 31.8 g/dL (ref 31.0–35.5)
MCV: 88.8 fL (ref 78.0–100.0)
MONOCYTE #: 0.87 10*3/uL (ref 0.20–1.10)
MONOCYTE %: 11.9 %
MPV: 10.7 fL (ref 8.7–12.5)
NEUTROPHIL #: 4.6 10*3/uL (ref 1.50–7.70)
NEUTROPHIL %: 63 %
PLATELETS: 237 10*3/uL (ref 150–400)
RBC: 4.03 10*6/uL (ref 3.85–5.22)
RDW-CV: 13 % (ref 11.5–15.5)
WBC: 7.3 10*3/uL (ref 3.7–11.0)

## 2022-03-10 LAB — BASIC METABOLIC PANEL
ANION GAP: 3 mmol/L — ABNORMAL LOW (ref 4–13)
BUN/CREA RATIO: 30 — ABNORMAL HIGH (ref 6–22)
BUN: 18 mg/dL (ref 8–25)
CALCIUM: 8.3 mg/dL — ABNORMAL LOW (ref 8.6–10.3)
CHLORIDE: 105 mmol/L (ref 96–111)
CO2 TOTAL: 29 mmol/L (ref 23–31)
CREATININE: 0.61 mg/dL (ref 0.60–1.05)
ESTIMATED GFR - FEMALE: 88 mL/min/BSA (ref >=60)
GLUCOSE: 80 mg/dL (ref 65–125)
POTASSIUM: 3.9 mmol/L (ref 3.5–5.1)
SODIUM: 137 mmol/L (ref 136–145)

## 2022-03-11 ENCOUNTER — Other Ambulatory Visit (HOSPITAL_BASED_OUTPATIENT_CLINIC_OR_DEPARTMENT_OTHER): Payer: Self-pay | Admitting: Specialist

## 2022-03-11 ENCOUNTER — Telehealth (HOSPITAL_BASED_OUTPATIENT_CLINIC_OR_DEPARTMENT_OTHER): Payer: Self-pay | Admitting: Specialist

## 2022-03-11 NOTE — Telephone Encounter (Signed)
03/11/2022 8:23am patient is unable to reach, patient needs a POST-OP appt scheduled I scheduled the patient on 03/27/2022 '@2'$ :00pm and will mail the appt. Info to the patient in the mail.  Larena Glassman, MA

## 2022-03-13 NOTE — Care Management Notes (Signed)
Referral Information  ++++++ Placed Provider #1 ++++++  Case Manager: Joyce Facemire  Provider Type: Acute -Rehab  Provider Name: [facility]  Address:  [address]  [place], [place] [address]  Contact: Christy Johnson    Phone: [phone] x  Fax:   Fax: [phone]

## 2022-03-17 ENCOUNTER — Other Ambulatory Visit: Payer: Medicare Other | Attending: Physical Medicine & Rehabilitation | Admitting: Physical Medicine & Rehabilitation

## 2022-03-17 ENCOUNTER — Non-Acute Institutional Stay (SKILLED_NURSING_FACILITY): Payer: Self-pay | Admitting: Internal Medicine

## 2022-03-17 ENCOUNTER — Non-Acute Institutional Stay
Admission: RE | Admit: 2022-03-17 | Discharge: 2022-04-05 | DRG: 560 | Disposition: A | Discharge status: Home Health | Payer: Medicare Other | Source: Skilled Nursing Facility | Attending: Internal Medicine | Admitting: Internal Medicine

## 2022-03-17 ENCOUNTER — Non-Acute Institutional Stay (SKILLED_NURSING_FACILITY): Payer: Medicare Other | Admitting: Internal Medicine

## 2022-03-17 ENCOUNTER — Encounter (INDEPENDENT_AMBULATORY_CARE_PROVIDER_SITE_OTHER): Payer: Medicare Other | Admitting: Foot Surgery

## 2022-03-17 ENCOUNTER — Other Ambulatory Visit: Payer: Self-pay

## 2022-03-17 DIAGNOSIS — Z7982 Long term (current) use of aspirin: Secondary | ICD-10-CM

## 2022-03-17 DIAGNOSIS — R059 Cough, unspecified: Secondary | ICD-10-CM

## 2022-03-17 DIAGNOSIS — A0811 Acute gastroenteropathy due to Norwalk agent: Secondary | ICD-10-CM | POA: Diagnosis not present

## 2022-03-17 DIAGNOSIS — Z6820 Body mass index (BMI) 20.0-20.9, adult: Secondary | ICD-10-CM

## 2022-03-17 DIAGNOSIS — H04123 Dry eye syndrome of bilateral lacrimal glands: Secondary | ICD-10-CM

## 2022-03-17 DIAGNOSIS — A499 Bacterial infection, unspecified: Secondary | ICD-10-CM

## 2022-03-17 DIAGNOSIS — R609 Edema, unspecified: Secondary | ICD-10-CM

## 2022-03-17 DIAGNOSIS — E46 Unspecified protein-calorie malnutrition: Secondary | ICD-10-CM | POA: Diagnosis present

## 2022-03-17 DIAGNOSIS — Z79899 Other long term (current) drug therapy: Secondary | ICD-10-CM

## 2022-03-17 DIAGNOSIS — Z111 Encounter for screening for respiratory tuberculosis: Secondary | ICD-10-CM

## 2022-03-17 DIAGNOSIS — K5900 Constipation, unspecified: Secondary | ICD-10-CM

## 2022-03-17 DIAGNOSIS — M7989 Other specified soft tissue disorders: Secondary | ICD-10-CM

## 2022-03-17 DIAGNOSIS — A046 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: Secondary | ICD-10-CM | POA: Diagnosis not present

## 2022-03-17 DIAGNOSIS — R110 Nausea: Secondary | ICD-10-CM

## 2022-03-17 DIAGNOSIS — M1991 Primary osteoarthritis, unspecified site: Secondary | ICD-10-CM | POA: Diagnosis present

## 2022-03-17 DIAGNOSIS — M6259 Muscle wasting and atrophy, not elsewhere classified, multiple sites: Secondary | ICD-10-CM | POA: Diagnosis present

## 2022-03-17 DIAGNOSIS — R197 Diarrhea, unspecified: Secondary | ICD-10-CM

## 2022-03-17 DIAGNOSIS — Z87891 Personal history of nicotine dependence: Secondary | ICD-10-CM

## 2022-03-17 DIAGNOSIS — M810 Age-related osteoporosis without current pathological fracture: Secondary | ICD-10-CM | POA: Diagnosis present

## 2022-03-17 DIAGNOSIS — R5381 Other malaise: Secondary | ICD-10-CM | POA: Insufficient documentation

## 2022-03-17 DIAGNOSIS — Z4789 Encounter for other orthopedic aftercare: Secondary | ICD-10-CM | POA: Diagnosis present

## 2022-03-17 DIAGNOSIS — E785 Hyperlipidemia, unspecified: Secondary | ICD-10-CM | POA: Diagnosis present

## 2022-03-17 DIAGNOSIS — Z9181 History of falling: Secondary | ICD-10-CM

## 2022-03-17 DIAGNOSIS — D62 Acute posthemorrhagic anemia: Secondary | ICD-10-CM | POA: Insufficient documentation

## 2022-03-17 DIAGNOSIS — Z9189 Other specified personal risk factors, not elsewhere classified: Secondary | ICD-10-CM

## 2022-03-17 DIAGNOSIS — S72142D Displaced intertrochanteric fracture of left femur, subsequent encounter for closed fracture with routine healing: Principal | ICD-10-CM

## 2022-03-17 DIAGNOSIS — G4700 Insomnia, unspecified: Secondary | ICD-10-CM

## 2022-03-17 DIAGNOSIS — R600 Localized edema: Secondary | ICD-10-CM

## 2022-03-17 DIAGNOSIS — H3530 Unspecified macular degeneration: Secondary | ICD-10-CM | POA: Diagnosis present

## 2022-03-17 DIAGNOSIS — S72002D Fracture of unspecified part of neck of left femur, subsequent encounter for closed fracture with routine healing: Secondary | ICD-10-CM

## 2022-03-17 DIAGNOSIS — M25552 Pain in left hip: Secondary | ICD-10-CM

## 2022-03-17 DIAGNOSIS — M419 Scoliosis, unspecified: Secondary | ICD-10-CM | POA: Diagnosis present

## 2022-03-17 DIAGNOSIS — S7292XA Unspecified fracture of left femur, initial encounter for closed fracture: Secondary | ICD-10-CM

## 2022-03-17 LAB — CBC WITH DIFF
BASOPHIL #: <0.1 10*3/uL (ref <=0.20)
BASOPHIL %: 0.4 %
EOSINOPHIL #: 0.18 10*3/uL (ref <=0.50)
EOSINOPHIL %: 2 %
HCT: 34.3 % — ABNORMAL LOW (ref 34.8–46.0)
HGB: 10.7 g/dL — ABNORMAL LOW (ref 11.5–16.0)
IMMATURE GRANULOCYTE #: <0.1 10*3/uL (ref <0.10)
IMMATURE GRANULOCYTE %: 0.4 % (ref 0.0–1.0)
LYMPHOCYTE #: 1.22 10*3/uL (ref 1.00–4.80)
LYMPHOCYTE %: 13.7 %
MCH: 28.1 pg (ref 26.0–32.0)
MCHC: 31.2 g/dL (ref 31.0–35.5)
MCV: 90 fL (ref 78.0–100.0)
MONOCYTE #: 0.89 10*3/uL (ref 0.20–1.10)
MONOCYTE %: 10 %
MPV: 10 fL (ref 8.7–12.5)
NEUTROPHIL #: 6.55 10*3/uL (ref 1.50–7.70)
NEUTROPHIL %: 73.5 %
PLATELETS: 356 10*3/uL (ref 150–400)
RBC: 3.81 10*6/uL — ABNORMAL LOW (ref 3.85–5.22)
RDW-CV: 14 % (ref 11.5–15.5)
WBC: 8.9 10*3/uL (ref 3.7–11.0)

## 2022-03-17 LAB — CBC/DIFF - CLIENT CONSOLIDATED
BASOPHIL #: <0.1 10*3/uL (ref <=0.20)
BASOPHIL %: 0.4 %
EOSINOPHIL #: 0.18 10*3/uL (ref <=0.50)
EOSINOPHIL %: 2 %
HCT: 34.3 % — ABNORMAL LOW (ref 34.8–46.0)
HGB: 10.7 g/dL — ABNORMAL LOW (ref 11.5–16.0)
IMMATURE GRANULOCYTE #: <0.1 10*3/uL (ref <0.10)
IMMATURE GRANULOCYTE %: 0.4 % (ref 0.0–1.0)
LYMPHOCYTE #: 1.22 10*3/uL (ref 1.00–4.80)
LYMPHOCYTE %: 13.7 %
MCH: 28.1 pg (ref 26.0–32.0)
MCHC: 31.2 g/dL (ref 31.0–35.5)
MCV: 90 fL (ref 78.0–100.0)
MONOCYTE #: 0.89 10*3/uL (ref 0.20–1.10)
MONOCYTE %: 10 %
MPV: 10 fL (ref 8.7–12.5)
NEUTROPHIL #: 6.55 10*3/uL (ref 1.50–7.70)
NEUTROPHIL %: 73.5 %
PLATELETS: 356 10*3/uL (ref 150–400)
RBC: 3.81 10*6/uL — ABNORMAL LOW (ref 3.85–5.22)
RDW-CV: 14 % (ref 11.5–15.5)
WBC: 8.9 10*3/uL (ref 3.7–11.0)

## 2022-03-17 LAB — BASIC METABOLIC PANEL
ANION GAP: 7 mmol/L (ref 4–13)
BUN/CREA RATIO: 33 — ABNORMAL HIGH (ref 6–22)
BUN: 21 mg/dL (ref 8–25)
CALCIUM: 8.9 mg/dL (ref 8.6–10.3)
CHLORIDE: 109 mmol/L (ref 96–111)
CO2 TOTAL: 29 mmol/L (ref 23–31)
CREATININE: 0.63 mg/dL (ref 0.60–1.05)
ESTIMATED GFR - FEMALE: 87 mL/min/BSA (ref >=60)
GLUCOSE: 77 mg/dL (ref 65–125)
POTASSIUM: 3.7 mmol/L (ref 3.5–5.1)
SODIUM: 145 mmol/L (ref 136–145)

## 2022-03-17 MED ORDER — TUBERCULIN PPD 5 TUB. UNIT/0.1 ML INTRADERMAL INJECTION SOLUTION
0.1000 mL | Freq: Once | INTRADERMAL | Status: AC
Start: 2022-03-27 — End: 2022-03-29
  Administered 2022-03-27 – 2022-03-29 (×2): 0.1 mL via INTRADERMAL

## 2022-03-17 MED ORDER — MAGNESIUM HYDROXIDE 400 MG/5 ML ORAL SUSPENSION
15.0000 mL | Freq: Four times a day (QID) | ORAL | Status: DC | PRN
Start: 2022-03-17 — End: 2022-04-05

## 2022-03-17 MED ORDER — ONDANSETRON 4 MG DISINTEGRATING TABLET
4.0000 mg | ORAL_TABLET | Freq: Four times a day (QID) | ORAL | Status: DC | PRN
Start: 2022-03-17 — End: 2022-04-05
  Filled 2022-03-17: qty 1

## 2022-03-17 MED ORDER — TUBERCULIN PPD 5 TUB. UNIT/0.1 ML INTRADERMAL INJECTION SOLUTION
0.1000 mL | Freq: Once | INTRADERMAL | Status: AC
Start: 2022-03-17 — End: 2022-03-20
  Administered 2022-03-17: 0 mL via INTRADERMAL
  Administered 2022-03-18 – 2022-03-20 (×2): 0.1 mL via INTRADERMAL

## 2022-03-17 MED ORDER — SENNOSIDES 8.6 MG-DOCUSATE SODIUM 50 MG TABLET
1.0000 | ORAL_TABLET | Freq: Two times a day (BID) | ORAL | Status: DC | PRN
Start: 2022-03-17 — End: 2022-04-05

## 2022-03-17 MED ORDER — ACETAMINOPHEN 325 MG TABLET
650.0000 mg | ORAL_TABLET | Freq: Four times a day (QID) | ORAL | Status: DC
Start: 2022-03-17 — End: 2022-03-18
  Administered 2022-03-17: 650 mg via ORAL
  Administered 2022-03-18: 0 mg via ORAL
  Administered 2022-03-18 (×2): 650 mg via ORAL
  Filled 2022-03-17 (×4): qty 2

## 2022-03-17 MED ORDER — DEXTROMETHORPHAN-GUAIFENESIN 10 MG-100 MG/5 ML ORAL SYRUP
10.0000 mL | ORAL_SOLUTION | ORAL | Status: DC | PRN
Start: 2022-03-17 — End: 2022-04-05

## 2022-03-17 MED ORDER — ASPIRIN 81 MG TABLET,DELAYED RELEASE
81.0000 mg | DELAYED_RELEASE_TABLET | Freq: Two times a day (BID) | ORAL | Status: AC
Start: 2022-03-17 — End: 2022-04-03
  Administered 2022-03-17 – 2022-04-03 (×34): 81 mg via ORAL
  Filled 2022-03-17 (×33): qty 1

## 2022-03-17 MED ORDER — MULTIVIT-MIN-FOLIC ACID 0.4 MG-LYCOPENE 300 MCG-LUTEIN 250 MCG TABLET
1.0000 | ORAL_TABLET | Freq: Every day | ORAL | Status: DC
Start: 2022-03-17 — End: 2022-03-18
  Administered 2022-03-17 – 2022-03-18 (×2): 1 via ORAL
  Filled 2022-03-17 (×2): qty 1

## 2022-03-17 MED ORDER — MELATONIN 3 MG TABLET
3.0000 mg | ORAL_TABLET | Freq: Every evening | ORAL | Status: DC | PRN
Start: 2022-03-17 — End: 2022-04-05
  Administered 2022-03-20: 3 mg via ORAL
  Administered 2022-03-25: 0 mg via ORAL
  Filled 2022-03-17 (×3): qty 1

## 2022-03-17 MED ORDER — CARBOXYMETHYLCELLULOSE SODIUM 0.5 % EYE DROPS
1.0000 [drp] | Freq: Four times a day (QID) | OPHTHALMIC | Status: DC
Start: 2022-03-17 — End: 2022-03-18
  Administered 2022-03-17 – 2022-03-18 (×3): 1 [drp] via OPHTHALMIC

## 2022-03-17 NOTE — Nurses Notes (Signed)
Skin assessment completed with Sheri Bells, LPN-bruising to arms & LLE-L hip incisions with staples (total 14 staples) in 3 incisions with border dressings intact-no s/s of infection noted-edema to BLE-LLE + 2 edema & trace edema to RLE.

## 2022-03-17 NOTE — Nurses Notes (Signed)
Pt arrived to unit via private vehicle and wheelchair. Family accompanied. Belongings inventoried. Pt oriented to room. Call light within reach.  Elta Guadeloupe, LPN

## 2022-03-18 ENCOUNTER — Other Ambulatory Visit: Payer: Self-pay

## 2022-03-18 ENCOUNTER — Ambulatory Visit (HOSPITAL_COMMUNITY): Payer: Medicare Other | Admitting: Radiology

## 2022-03-18 DIAGNOSIS — Z9189 Other specified personal risk factors, not elsewhere classified: Secondary | ICD-10-CM

## 2022-03-18 DIAGNOSIS — Z4789 Encounter for other orthopedic aftercare: Secondary | ICD-10-CM | POA: Diagnosis present

## 2022-03-18 DIAGNOSIS — H3530 Unspecified macular degeneration: Secondary | ICD-10-CM

## 2022-03-18 DIAGNOSIS — M25552 Pain in left hip: Secondary | ICD-10-CM

## 2022-03-18 DIAGNOSIS — R6889 Other general symptoms and signs: Secondary | ICD-10-CM

## 2022-03-18 DIAGNOSIS — S72142A Displaced intertrochanteric fracture of left femur, initial encounter for closed fracture: Secondary | ICD-10-CM

## 2022-03-18 MED ORDER — ACETAMINOPHEN 325 MG TABLET
650.0000 mg | ORAL_TABLET | ORAL | Status: DC | PRN
Start: 2022-03-18 — End: 2022-04-05
  Administered 2022-03-20: 650 mg via ORAL
  Filled 2022-03-18: qty 2

## 2022-03-18 MED ORDER — CARBOXYMETHYLCELLULOSE 0.5 %-GLYCERIN 0.9 % EYE DROPS
1.0000 [drp] | Freq: Every day | OPHTHALMIC | Status: DC
Start: 2022-03-18 — End: 2022-03-18

## 2022-03-18 MED ORDER — CARBOXYMETHYLCELLULOSE 0.5 %-GLYCERIN 0.9 % (PF) EYE DROPS,DROPPERETTE
1.0000 [drp] | Freq: Every day | OPHTHALMIC | Status: DC
Start: 2022-03-18 — End: 2022-04-05
  Administered 2022-03-18 (×4): 1 [drp] via OPHTHALMIC
  Administered 2022-03-19: 0 [drp] via OPHTHALMIC
  Administered 2022-03-19 – 2022-03-31 (×61): 1 [drp] via OPHTHALMIC
  Administered 2022-03-31: 0 [drp] via OPHTHALMIC
  Administered 2022-04-01 – 2022-04-03 (×11): 1 [drp] via OPHTHALMIC
  Administered 2022-04-03: 0 [drp] via OPHTHALMIC
  Administered 2022-04-03: 1 [drp] via OPHTHALMIC
  Administered 2022-04-03: 0 [drp] via OPHTHALMIC
  Administered 2022-04-04 (×4): 1 [drp] via OPHTHALMIC
  Administered 2022-04-04: 0 [drp] via OPHTHALMIC
  Administered 2022-04-05 (×2): 1 [drp] via OPHTHALMIC
  Administered 2022-04-05: 0 [drp] via OPHTHALMIC

## 2022-03-18 MED ORDER — VIT C 250 MG-VIT E 90 MG-ZINC 40 MG-COPPER 1 MG-LUTEIN-ZEAXAN CAPSULE
1.0000 | ORAL_CAPSULE | Freq: Two times a day (BID) | ORAL | Status: DC
Start: 2022-03-18 — End: 2022-04-05
  Administered 2022-03-18 – 2022-04-05 (×35): 1 via ORAL

## 2022-03-18 NOTE — Nurses Notes (Signed)
Resident alert and oriented times four, verbally makes all needs known. Resident is a standby with a wheeled walker with transfers. Resident has clean, dry drsgs to left hip surgical incision and RN on shift, assessed and changed drsgs to left hip. Resident eats a regular diet and takes meds whole in applesauce. Resident refused vaccines and did consent to a PPD and was given a PPD in her left forearm. PA made aware of PPD and CXR canceled. Resident continent of b/b. Resident tolerated and participated in therapy. Resident c/o of left leg pain at a 4/10 per shift and scheduled tylenol effective for pain control. BP: 101/62, Resp.-18, Temp.-36.6, HR-97, SPO2-94%RA. Marland KitchenRodena Medin, LPN

## 2022-03-18 NOTE — Nurses Notes (Signed)
Patient is alert and oriented and is able to make wants and needs known. Lung sounds clear. Bowel sounds present and active. Dressings to left hip are dry and intact. Side rails up x 2. Call bell and water within reach.

## 2022-03-18 NOTE — Care Plan (Signed)
[facility] [place]  [facility]  Speech Therapy Initial Swallow and Speech/Language Evaluation    Patient Name: Sheri Garza  Date of Birth: 1935-03-11  Height: Height: 149.9 cm ('4\' 11"'$ )  Weight: Weight: 45.4 kg (100 lb)  Room/Bed: 220/A  Payor: MEDICARE / Plan: MEDICARE PART A AND B / Product Type: Medicare /     Therapy Diagnosis Codes: Cognitive Communication Deficit R41.841      The following procedures were performed this session: ST Evaluation: moderate complexity    Swallow evaluation 92610: 8  Speech language evaluation 92523: 60  Speech language treatment 92507: 15  Total minutes:  42    Swallow Assessment:   Functional Level At Time Of Eval: Oropharyngeal phase of swallow appears to be [facility] for continuation of regular solids and thin liquids. No clinical overt s/s of aspiration or distress appreciated. Sufficient bolus manipulation, a-p transit, and clearance from oral cavity with timely swallow response. Pt is independently utilizing compensatory swallow strategies for slow rate, bolus size modifications, cyclic ingestion, and single bites/sips. ST services are not warranted for dysphagia management.     SLP Diet Recommendation: thin liquids, regular solid  Aspiration Precautions:    Signs/Symptoms of Aspiration Noted (Swallowing): none     Section K:   Swallowing Disorder:   Loss of liquids/solids from the mouth when eating or drinking: no  Holding food in mouth/cheeks or residual food in mouth after meals: no  Coughing or choking during meals or when swallowing medications: no  Complaints of difficulty or pain with swallowing: no  None of the above: yes  Parental IV:  no  Feeding tube:  no  Mechanically altered diet:  no  Therapeutic diet: no    Speech Assessment:  Functional Level At Time Of Evaluation: Pt presents with moderate-severe cognitive communication deficits impacting attention, problem-solving, executive functioning, reasoning, organization,  planning, and short-term recall. Pt noted to repeat herself several times during assessment. Pt lives at home alone and is responsible for medication + financial management, cooking, cleaning, household ADLs, and is still driving. Pt endorses changes in her memory prior to and following her fall. Pt is AAOx4 with speech and language structures intact. Skilled ST services are recommended and medically necessary to address functional deficits in order to facilitate safe return to PLOF.   Skilled ST services are recommended and medically necessary to address functional deficits. Without skilled intervention, Pt is at risk for further decline in function, reduced ability to return to PLOF, falls, increased dependency upon caregivers, increased confusion.     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       Speech Discharge Disposition: home with home health (Will need justification of DC recommendation if placement is required.)  JUSTIFICATION OF DISCHARGE RECOMMENDATION  Based on current diagnosis, functional performance prior to admission, and current functional performance, this patient requires continued SLP services in home with home health in order to achieve significant functional improvements for Cognitive/Linguistic.    Plan:  To provide Speech Therapy services   2x/wk for duration of 30 days .    The risks and benefits of therapy have been discussed with the patient/caregiver and he/she is in agreement with the established plan of care.    STG 1: Pt will improve short-term memory skills to mild while utilizing compensatory recall strategies as needed with a combination of cues in order to promote independence and decrease level of assistance from caregivers  STG 2: Pt will complete complex problem-solving tasks with 80% accuracy with a combination of cues in order to safely perform ADLs with decreased need for assistance   STG 3: Pt  will complete thought organization tasks with 80% accuracy while utilizing compensatory strategies with a combination of cues in order to promote safety and independence and promote the ability to safely return home   STG 4: Pt will sustain attention for 5 min increments while participating with therapeutic tasks with a combination of cues in order to promote independence and decrease level of assistance from caregivers   LTG 1: Pt will improve cognitive linguistic skills to promote safe d/c to safest functional environment     Subjective & Objective:     03/18/22 1158   Therapist Pager   SLP Pager Debe Coder [phone]   Rehab Session   Document Type evaluation   Total SLP Minutes: 42   Patient Effort good   Symptoms Noted During/After Treatment none   General Information   Patient Profile Reviewed yes   General Observations of Patient Pt was awake, alert, upright in bed and breathing comfortably on room air   Pertinent History of Current Functional Problem Per MD, "Nicoya Radermacher is a 87 y.o. female with osteoporosis and hyperlipidemia who was seen today for admission h/p following a hospital stay and acute rehab stint secondary to a fall at home that resulted in left intertrochanteric fracture requiring ORIF on 03/04/2022.      Today on interview she voiced no acute complaints. Stated her pain was controlled and she is eager to continue to work with therapy."   Mutuality/Individual Preferences   Anxieties, Fears or Concerns none verbalized   Individualized Care Needs regular solids, thin liquids   Plan of Care Reviewed With patient   Functional Status Prior   Communication 0 - understands/communicates without difficulty   Swallowing 0 - swallows foods/liquids without difficulty   Coping/Psychosocial   Observed Emotional State calm;cooperative   Verbalized Emotional State acceptance   Family/Support System   Family/Support Persons family   Involvement in Care not present at bedside   Coping/Psychosocial Response  Interventions   Trust Relationship/Rapport care explained;choices provided;emotional support provided;empathic listening provided;questions answered;questions encouraged;reassurance provided;thoughts/feelings acknowledged   Cognitive Assessment/Interventions   Behavior/Mood Observations alert;cooperative   Orientation Status oriented to;person;place;situation;time   Attention difficulty attending to task/directions;difficult dividing attention;needs cues to re-direct   Follows Commands [facility]   Additional Documentation Brief Interview for Mental Status (BIMS)   Brief Interview for Mental Status (BIMS)   Repetition of Three Words - Number of words repeated after first attempt 3   Temporal Orientation  - Able to Report Correct Year 3   Temporal Orientation - Able to Report Correct Month 2   Temporal Orientation - Able to Report Correct Day of the Week 1   Recall - Able to Recall "Sock" 0   Recall - Able to Recall "Blue" 2   Recall - Able to Recall "Bed" 2   BIMS Total Score 13   Oral Motor Structure and Function    Additional Documentation Oral Motor Structure/Functional Assessment (Group)   Oral Motor Structure/Functional Assessment   Dentition (Oral Motor) present and adequate   Mucosal Quality (Oral Motor Assessment) good   Oral Musculature (Oral Motor Assessment) WFL   Secretion Management (Oral Motor Assessment) WFL   Velar Elevation (Oral Motor) WFL   Volitional Cough (Oral Motor Assessment) no issues initiating volitional cough   Volitional Swallow (Oral Motor Assessment) no issues initiating  volitional swallow   Non Instrumental/Clinical Swallow (NIS)   Additional Documentation Solid Texture Trial (NIS) (Group);Thin Liquid Trial (NIS) (Group)   Thin Liquid Trial (NIS)   Mode of Presentation, Thin Liquid (NIS) self-fed;cup   Oral Phase Results, Thin Liquid (NIS) intact oral phase without signs of dysfunction   Pharyngeal Phase Results, Thin Liquid (NIS) safe swallow, no signs/symptoms of aspiration or penetration    Volume Presented in mL, Thin Liquid (NIS) patient controlled volumes   Solid Texture Trial (NIS)   Mode of Presentation, Solid Food (NIS) self-fed   Oral Phase Results, Solid Food (NIS) intact oral phase without signs of dysfunction   Pharyngeal Phase Results, Solid Food (NIS) safe swallow, no signs/symptoms of aspiration or penetration   Volume Presented in mL, Solid Food (NIS) patient controlled volumes   Plan of Care Review   Plan Of Care Reviewed With patient   SLP Clinical Impression   Assessment Moderate-severe cognitive communication deficits   Functional Level At Time Of Evaluation Pt presents with moderate-severe cognitive communication deficits impacting attention, problem-solving, executive functioning, reasoning, organization, planning, and short-term recall. Pt noted to repeat herself several times during assessment. Pt lives at home alone and is responsible for medication + financial management, cooking, cleaning, household ADLs, and is still driving. Pt endorses changes in her memory prior to and following her fall. Pt is AAOx4 with speech and language structures intact. Skilled ST services are recommended and medically necessary to address functional deficits in order to facilitate safe return to PLOF.   Patient/Family Goals Statement Pt wishes to return home and to all previous roles/responsibilities   Criteria for Skilled Therapeutic Interventions Met (SLP Eval) skilled criteria for speech language intervention met   Rehab Potential (SLP Eval) fair, will monitor progress closely   Therapy Frequency (SLP Eval) other (see comments)  (2x/wk for 30 days)   Anticipated Discharge Disposition home with home health   Swallowing Clinical Impression   Functional Level At Time Of Eval Oropharyngeal phase of swallow appears to be [facility] for continuation of regular solids and thin liquids. No clinical overt s/s of aspiration or distress appreciated. Sufficient bolus manipulation, a-p transit, and clearance from  oral cavity with timely swallow response. Pt is independently utilizing compensatory swallow strategies for slow rate, bolus size modifications, cyclic ingestion, and single bites/sips   Criteria for Skilled Therap Interv Met no problems identified which require skilled intervention   SLP Diet Recommendation thin liquids;regular solid   Signs/Symptoms of Aspiration Noted (Swallowing) none   Plan of care reviewed with: Patient;MD;RN     Pt was left in good condition with nursing staff at bedside.    Therapist:  Sandrea Hammond, [place]   Pager #: [phone]  Phone #: [phone]    Everardo All, MD

## 2022-03-18 NOTE — Nurses Notes (Signed)
Resident consented to PPD test and was given in Left forearm.

## 2022-03-18 NOTE — Care Plan (Signed)
[place]  Spiritual Care Note    Patient Name:  Sheri Garza  Date of Encounter:   03/18/2022    Other Pertinent Information: I visited with "Romie Minus" to introduce her to Spiritual Care. During our visit, we reflected on her active life at home and how her injury has been very life-limiting for her. She expressed gratitude for her care and hope that she continues to get better so she can be back at home crafting and staying busy. We reflected on her faith and how it has been a pillar for her in life. I let her know of our ongoing availability and will continue to follow during her stay.      03/18/22 1106   Clinical Encounter Type   Reason for Visit General Spiritual Care Visit   Referral From Chaplain-initiated   [place] Visit At This Time No   Visited With Patient   Patient Spiritual Encounters   Spiritual Needs/Issues Image of God/Sacred;Adjustment to new diagnosis   Spiritual/Coping Resources Beliefs helpful in coping;Beliefs in God/Sacred/Higher Purpose;Courage;Gratitude;Confidence;Image of God/Sacred;Sense of purpose/meaning   Coping Offered empathy;Provided supportive presence;Facilitated story telling;Explored emotions;Facilitated meaning making   Other Support Services Provided Non-anxious presence;Consulted with Interdisciplinary Team   Information/Education Provided Spiritual Care scope of service   Spiritual Care outcomes with Patient   Spiritual/Emotional Processing  Spiritual Care relationship established;Patient shared/processed his/her/their story;Connected to spiritual support    Patient Coping More hopeful;More peaceful   Time of Encounters   Start Time 1027   Stop Time 1106   Duration (minutes) 39 Minutes       Shirlee More, [place]  Pager: [phone]  Total Time of Encounter: 45 min.

## 2022-03-18 NOTE — H&P (Signed)
[place] Note    Location: [facility]  Note type: H/P    PATIENT NAME: Sheri Garza  MRN: [id number]  DOB: 02/05/1935  Date of Service: 03/18/2022  CC: No chief complaint on file.        SUBJECTIVE/HPI: Sheri Garza is a 86 y.o. female with osteoporosis and hyperlipidemia who was seen today for admission h/p following a hospital stay and acute rehab stint secondary to a fall at home that resulted in left intertrochanteric fracture requiring ORIF on 03/04/2022.     Today on interview she voiced no acute complaints. Stated her pain was controlled and she is eager to continue to work with therapy.           Past Medical History:  Patient Active Problem List    Diagnosis Date Noted    Orthopedic aftercare 03/18/2022    Acute blood loss as cause of postoperative anemia 03/17/2022    Atrophy of muscle of multiple sites 03/17/2022    Physical deconditioning 03/17/2022    Aftercare for healing traumatic closed fracture of left hip 03/03/2022    Back pain     DDD (degenerative disc disease), cervical     Insomnia     Kyphoscoliosis and scoliosis     Lumbar spinal stenosis     Osteoarthrosis     Avitaminosis D 09/14/2018    Thyroid nodule 04/01/2016    Spinal stenosis 04/01/2016    Hyperlipidemia 04/01/2016    Right Knee OA 09/21/2012       Past Surgical History:  Past Surgical History:   Procedure Laterality Date    HX CATARACT REMOVAL Left     HX CHOLECYSTECTOMY      HX COLONOSCOPY      HX DILATION AND CURETTAGE      HX OTHER      trigger finger, repair quad    HX TONSILLECTOMY      HX WISDOM TEETH EXTRACTION             Family History:  Family Medical History:       Problem Relation (Age of Onset)    Cancer Other, Father, Brother    Diabetes Other, Mother    HTN <20 y.o. Other    Heart Disease Mother              Social History:  Social History     Tobacco Use    Smoking status: Former    Smokeless tobacco: Never   Substance Use Topics    Alcohol use: No    Drug use: No        Medications:    Current Facility-Administered Medications:     acetaminophen (TYLENOL) tablet, 650 mg, Oral, Q6HRS, Leadman, Kelsey M, PA-C, 650 mg at 03/18/22 [id number]    aspirin (ECOTRIN) enteric coated tablet 81 mg, 81 mg, Oral, 2x/day, Heloise Beecham Kelsey M, PA-C, 81 mg at 03/18/22 [id number]    Carboxymethylcell-Glycerin(PF) 0.5-0.9 % Drop[place]rette 1 Drop, 1 Drop, Ophthalmic, 5x/day, Leadman, Kelsey M, PA-C    dextromethorphan-guaiFENesin (ROBITUSSIN DM) 10-'100mg'$  per 1m oral liquid, 10 mL, Oral, Q4H PRN, Leadman, Kelsey M, PA-C    magnesium hydroxide (MILK OF MAGNESIA) '400mg'$  per 532moral liquid, 15 mL, Oral, 4x/day PRN, Leadman, Kelsey M, PA-C    melatonin tablet, 3 mg, Oral, HS PRN - MR x 1, Leadman, Kelsey M, PA-C    multivitamin (PRESERVISION AREDS 2) with minerals capsule, 1 Capsule, Oral, 2x/day-Food, LeAudie PintoPA-C  ondansetron (ZOFRAN ODT) rapid dissolve tablet, 4 mg, Oral, Q6H PRN, Leadman, Kelsey M, PA-C    sennosides-docusate sodium (SENOKOT-S) 8.6-'50mg'$  per tablet, 1 Tablet, Oral, 2x/day PRN, Heloise Beecham, Kelsey M, PA-C    tuberculin PPD 5 units/0.1 mL intradermal injection, 0.1 mL, Intradermal, Once, Leadman, Kelsey M, PA-C    [START ON 03/27/2022] tuberculin PPD 5 units/0.1 mL intradermal injection, 0.1 mL, Intradermal, Once, Audie Pinto, PA-C      Allergies:  Allergies   Allergen Reactions    Antihistamine [Diphenhydramine Hcl] Swelling    Penicillins      Rash, Dizziness    Vancomycin  Other Adverse Reaction (Add comment)     Phlebitis    Flagyl [Metronidazole]     Gentamicin     Iodine And Iodide Containing Products     Lidocaine      Except xylocaine. Ok taking that    Other      Antihistamines and all caines, Pt states Xylocaine is ok    Tetracycline        Review of Systems:   14 point ROS completed and no additional positives identified outside of HPI.       OBJECTIVE    Vitals:    03/17/22 1651 03/18/22 0500   BP: 118/67 130/64   Pulse: 90 80   Resp: 18 18   Temp: 36.6 C (97.9 F) 36.7  C (98.1 F)   SpO2: 97% 97%   Weight: 45.4 kg (100 lb)    Height: 1.499 m ('4\' 11"'$ )    BMI: 20.24              PHYSICAL EXAMINATION:   GENERAL: no acute distress  HEENT: no scleral injection, NCAT  SKIN: no rash  HEART: RRR, no murmur  LUNGS: clear to auscultation  ABDOMEN: soft non-tender, normal bowel sounds  MUSCULOSKELETAL: full range of motion  GENITAL/RECTAL: not performed  NEUROLOGICAL: no focal weakness  LYMPHATIC: no lower extremity edema   MENTAL STATE: alert oriented x 3           ASSESSMENT AND PLAN:  Sheri Garza is a 87 y.o. female seen today for admission h/p following acute rehab stay at [facility] for recent left intertrochanteric fracture and ORIF.     Left Intertrochanteric fracture s/p ORIF (aftercare)  Continue aggressive Pt/OT  Continue ASA for 21 days post op  Continue tylenol for pain  Weight bearing as tolerated    At Risk for malnutrition  Consult nutrition / dietician   Consider calorie count as needed    Macular Degeneration  Continue preservision for dry eyes as needed        On 03/18/2022 I spent a total visit time of 50 minutes. Time included review of tests and ordering tests, obtaining/reviewing history, examining the patient, communicating with consultants, documenting clinical information and counseling the patient and/or family regarding the diagnosis and management plan and coordination of care involved services directly related to patient care.      Everardo All, MD 03/18/2022, 09:19       [place]        Resident/Patient Name: Sheri Garza    Capacity Review:      '[x]'$  Initial Evaluation  '[]'$  Significant Change  '[]'$  Annual  '[]'$  Other    The term "incapacity" shall mean the inability, because of physical or mental impairment to appreciate the nature and implications of a health care decision, to make an informed choice regarding the alternatives presented,  and to communicate in an unambiguous manner.     I have interviewed the above named  resident/patient and have determined that he/she:    '[x]'$   Demonstrates CAPACITY to make medical decisions.    '[]'$   Demonstrates INCAPACITY to make medical decisions.        DURATION Short Term: '[]'$     Long Term '[]'$      NATURE '[]'$  Short-term memory loss    '[]'$  Aphasia    '[]'$  Other (specify)      '[]'$  Disorientation     '[]'$  Comatose     '[]'$  Inability to process information     '[]'$  Delusions, Hallucinations       CAUSES                                         By signing below, if applicable, I am certifying this resident, who is conscious, has been informed by me, the attending physician, that he or she has been determined to be incapacitated and that a medical power of attorney representative or surrogate decision-maker may be making decisions regarding life-prolonging intervention or mental health treatment for him/her.           Physician Signature:   Everardo All, MD                        Date: 03/18/22          ** For persons with Psychiatric mental illness, mental retardation or addiction who have been determined by their attending physician or a qualified physician to be incapacitated, a second opinion by a qualified physician or qualified psychologist is required to confirm incapacity before the attending physician is authorized to select a surrogate. The requirement for a second opinion shall not apply in those instances in which the medical treatment to be rendered is not for the person's psychiatric mental illness.     I concur with the above decision of incapacity                                                                                                                                                        Physician/Psychiatrist/Psychologist        Date

## 2022-03-18 NOTE — Care Plan (Signed)
Medical Nutrition Therapy Assessment        SUBJECTIVE :   SNU initial assessment  Physician consult: at risk for malnutrition  Visited pt at bedside. Pt reported a decreased appetite since admission due to lack of physical movement. Pt reported that prior to admission/at home her physical activity was higher verses what it is currently. Added that physical movement helps increase her appetite. Denied drinking supplements at home such as boost or ensure, stated she typically drinks milk. Pt reported losing weight but stated it has occurred overtime and over "many years." She added that "several years ago" she weighed 120# but now weighs ~100#. Reported that she typically does not weigh her self at home. Pt denied N/V/D and in regards to constipation occurring, pt stated "its a little hard to get going sometimes." Pt appeared motivated to do well in therapy and to get back to her home, where she stated she recently took in a stray cat that she seemed excited to get back home to. Stated family and neighbors have been taking care of the cat now.   Stated disliking chicken, [place] and duck. Will note in meal ordering system.  Pt stated she had ensure supplements at home but had not had a chance to try them. Pt was initially hesitant to add supplements to trays, but ultimately was agreeable to trying an ensure "1 time" to see if she would like it. Will order Ensure plus HP 1x/day (vanilla flavor) per pt request and will follow up with pt/nursing staff to determine likeness to supplement. Encouraged pt to try as it may aid with increasing PO intake throughout the day and may aid with pt weight gain due to its calorie/protein content, as pt stated that she felt like she was "thin."      OBJECTIVE: Medications: acetaminophen, aspirin, carboxymethylcell-glycerin, Dextromethorphan-guaifenesin, magnesium hydroxide, melatonin, multivitamin with minerals, ondansetron, sennosides-docusate, tuberculin PPD 5 units    Current Diet  Order/Nutrition Support:  DIET REGULAR  MNT PROTOCOL FOR DIETITIAN     Height Used for Calculations: 149.9 cm (4' 11.02")  Weight Used For Calculations: 45.4 kg (100 lb 1.4 oz)  BMI (kg/m2): 20.25     Ideal Body Weight (IBW) (kg): 43.61  % Ideal Body Weight: 104.1                 Per EMR no significant wt loss x past 12 months  Wt Readings from Last 15 Encounters:   03/17/22 45.4 kg (100 lb)   03/03/22 45.8 kg (100 lb 15.5 oz)   03/03/22 44.5 kg (98 lb)   01/16/22 44.5 kg (98 lb 1.7 oz)   09/11/21 47.6 kg (105 lb)   08/28/21 45.8 kg (101 lb)   08/16/21 46.1 kg (101 lb 10.1 oz)   05/27/21 46.3 kg (102 lb)   03/27/21 46.3 kg (102 lb)   02/25/21 45.5 kg (100 lb 4.8 oz)   08/24/20 45.5 kg (100 lb 3.2 oz)   04/23/20 46.9 kg (103 lb 8 oz)   02/03/20 48 kg (105 lb 13.1 oz)   10/10/19 47 kg (103 lb 11.2 oz)   09/21/19 48.1 kg (106 lb 0.7 oz)           Physical Assessment:   Last BM per flowsheet: 3/5  Pt has non pitting edema to RLE, 1+ edema to LLE  No decub noted  Pt documented to have wound incision to L hip. Wound incision documented prior to [facility] admission on 03/04/22  Estimated Needs:    Energy Calorie Requirements: 1350-1600kcal (30-35kcal/45.4 kg BW) per day  Protein Requirements (gms/day): 52-65g (1.2-1.5g/43.61 kg IBW) per day   Fluid Requirements: minimum 1533m DRI per day    Comments:   NHypatia [place]is a 87y.o. female; PMH osteoporosis, HLD. [facility] secondary to a fall at home that resulted in left intertrochanteric fracture requiring ORIF on 03/04/2022.    Pt documented intake since SNU admission 3/4: 100%, 75%, 100%     Latest Reference Range & Units 03/17/22 05:17   SODIUM 136 - 145 mmol/L 145   POTASSIUM 3.5 - 5.1 mmol/L 3.7   CHLORIDE 96 - 111 mmol/L 109   CARBON DIOXIDE 23 - 31 mmol/L 29   BUN 8 - 25 mg/dL 21   CREATININE 0.60 - 1.05 mg/dL 0.63   GLUCOSE 65 - 125 mg/dL 77   ANION GAP 4 - 13 mmol/L 7   BUN/CREAT RATIO 6 - 22  33 (H)   ESTIMATED  GFR - FEMALE >=60 mL/min/BSA 87   CALCIUM 8.6 - 10.3 mg/dL 8.9   (H): Data is abnormally high    Plan/Interventions :   1) Continue regular diet  - please encourage intake  2) Will start Ensure plus HP (vanilla) per pt request 1x/day  - will follow up with pt/nursing staff to determine pt likeness to supplement  3) Continue multivitamin with minerals for micronutrient support  4) Monitor BM's for regularity    5) Continue weekly weights   6) Will monitor labs/wts/PO intake    Dietitian available prn    Nutrition Diagnosis: Underweight related to  suspected inadequate protein energy intake prior to admission  as evidenced by  BMI < 23.5 (65+)    ECephus Shelling RD  03/18/2022, 14:03  Phone [phone]

## 2022-03-18 NOTE — Pharmacy (Signed)
Pharmacist Drug Regimen Review    [place]        Resident: Keshayla Scurry  MRN: [id number]  Date: 03/18/22    Description:  admission review    MEDICATION REGIMEN REVIEW FINDINGS    Did a Complete drug regimen review identify potential clinically significant medication issues? No issues found during review    Medications without proper diagnosis: none     Labs performed for medication that require monitoring:  none    Medications contributing to adverse events or unwanted outcomes (i.e. Falls, loss of appetite, etc.):  none    Unnecessary medications or inappropriate therapies (Beers Criteria):  none    Gradual Dose Reduction: none    Other Comments:  none    PHARMACIST DRUG REGIMEN REVIEW    Resident chart reviewed: No Recommendations    Rocky Morel, RPh  03/18/2022, 09:29        PHYSICIAN RESPONSE:  Everardo All, MD

## 2022-03-18 NOTE — Care Plan (Signed)
[facility]  [place]  Speech Therapy Progress Note    Patient Name: Sheri Garza  Date of Birth: Aug 22, 1935  Weight:  45.4 kg (100 lb)  Room/Bed: 220/A  Payor: MEDICARE / Plan: MEDICARE PART A AND B / Product Type: Medicare /      Date/Time of Admission: 03/17/2022  4:27 PM  Admitting Diagnosis:  Aftercare for healing traumatic closed fracture of left hip [S72.002D]        Assessment:  Moderate-severe cognitive communication deficits    Plan:  Recommendations: ST to address functional deficits in POC   Results & Recommendations Discussed With:Patient, Nurse, and MD   Continue to follow patient according to established plan of care.  The risks/benefits of therapy have been discussed with the patient/caregiver and he/she is in agreement with the established plan of care.       Subjective:  Pt was awake, alert, upright in bed and breathing on room air comfortably    Objective:  Pt engaged in cognitive linguistic tasks targeting attention, thought organization, memory, and problem-solving. Pt was able to sustain attention for 2-3 min increments requiring verbal cues and redirection to improve topic and task maintenance. Pt noted to repeat previously discussed information and had difficulty recalling items from list despite a combination of cues. SLP provided Pt education regarding assessment results, recommendations, and goals of POC. She verbalized 100% understanding and agreement with POC. Pt was left in good condition with call light and bedside table within reach.      Therapist:  Sandrea Hammond, ST   Pager #: [phone]  Phone #: [phone]  Treatment Time: 15 minutes

## 2022-03-18 NOTE — Care Plan (Signed)
[facility]  Skilled Nursing Unit  Occupational Therapy Evaluation            Patient Name: Sheri Garza  Date of Birth: 01/11/1936  Height: Height: 149.9 cm ('4\' 11"'$ )  Weight: Weight: 45.4 kg (100 lb)  Room/Bed: 220/A  Payor: Payor: MEDICARE / Plan: MEDICARE PART A AND B / Product Type: Medicare /     Admission Diagnosis: Aftercare for healing traumatic closed fracture of left hip [S72.002D]  Medical Diagnosis Codes:   Active Hospital Problems    Diagnosis    Orthopedic aftercare [Z47.89]    At risk for malnutrition [Z91.89]    Left hip pain [M25.552]    Acute blood loss as cause of postoperative anemia [D62]    Atrophy of muscle of multiple sites [M62.59]    Physical deconditioning [R53.81]    Aftercare for healing traumatic closed fracture of left hip [S72.002D]      Therapy Diagnosis Codes: z91.81, 99991111. 123XX123    Certification From:  03/18/22-04/17/22      Assessment:    Sheri Garza is an 87 yo community dwelling female with fall on 2/19 when she lost control of heavy shopping cart and fell resulting in intramedullary hip fx, fixed with ORIF on 2/20 and WBAT. Pt transferred to [facility] where she reports she was limited in performance by pain, but otherwise made good progress. She presents to [facility] for further rehab with desire to return to home at [facility] with all areas.    (P) Patient presents with high level rehab needs with most notable deficits being with LLE function following ORIF. OT to address UB strength, funcitonal endurance, and ADL retraining in order to facilitate patient's safe and sustainable discharge to home at [facility].         03/18/22 1350   Therapist Pager   OT Assigned/ Pager # [place]   (Pt lives alone in 2 story home, capable of 1 story living with 2 STEs to ground level. Pt previously indep with all B/I/MRADLs including driving and housekeeping. She has family who live close by who are able to provide intermittent  support.)   Prior Function   Level of Independence Independent with ADLs and functional transfers;Independent with homemaking with ambulation   Cognitive Assessment/Interventions   Behavior/Mood Observations behavior appropriate to situation, WNL/WFL   Orientation Status oriented x 3   Attention distractible   Follows Commands WFL   Comment   (Working with SLP on congition)   Vision Assessment/Interventions   Visual Impairment/Limitations other (see comments)  (Pt with MD, reports only effecting 'up close vision')   RUE Assessment   RUE Assessment WFL for age   LUE Assessment   LUE Assessment WFL for Age   Pain Assessment   Additional Documentation   (Pt states "I'm really only here for pain. My leg doesnt want to do what I ask it to because it hurts." Pt states tylenol is insuffecient, but 'takes the edge off' and that she cannot take stronger pain Rx due to cognitive side effects.)   Pain Assessment   Pain Intervention  Ice   Bed Mobility Assessment/Treatment   Bed Mobility, Assistive Device leg lifter;bed rails   Supine-Sit Independence modified independence   Sit to Supine, Independence modified independence   Transfer Assessment/Treatment   Sit-Stand Independence contact guard assist   Stand-Sit Independence contact guard assist   Sit-Stand-Sit, Assist Device walker, front wheeled  Bed-Chair Independence contact guard assist   Chair-Bed Independence contact guard assist   Bed-Chair-Bed Assist Device walker, front wheeled   Toilet Transfer Independence contact guard assist   Toilet Transfer Assist Device walker, front wheeled   Transfer Impairments ROM decreased;pain;motor control impaired   Upper Body Dressing Assessment/Training   Independence Level  set up required   Lower Body Dressing Assessment/Training   Independence Level  contact guard assist   Toileting Assessment/Training   Independence Level  supervision required   Grooming/Oral Hygeine  Assessment/Training   Independence Level contact guard assist    Care Plan Goals   OT Rehab Goals Bathing Goal;Home Management Goal;LB Dressing Goal;Strength Goal   Bathing Goal   Bathing Goal, Date Established 03/18/22   Bathing Goal, Time to Achieve 30 days   Bathing Goal, Activity Type all bathing tasks   Bathing Goal, Independence Level modified independence   Home Management Goal   Home Mgmt Goal, Date Established 03/18/22   Home Mgmt Goal, Time to Achieve 30 days   Home Mgmt Goal, Activity Type Light housekeeping and small meal prep   Home Mgmt Goal, Independence Level modified independence   LB Dressing Goal   LB Dressing Goal, Date Established 03/18/22   LB Dressing Goal, Time to Achieve 30 days   LB Dressing Goal, Activity Type all lower body dressing tasks   LB Dressing Goal, Independence Level modified independence   Strength Goal   Strength Goal, Date Established 03/18/22   Strength Goal, Time to Achieve 30 days   Strength Goal, Measure to Achieve BUEs 5/5   Strength Goal, Functional Goal Increase BUE strength to facilitae indep with MRADLs with reduced WB tolerance on LLE   Assessment   Assessment Patient presents with high level rehab needs with most notable deficits being with LLE function following ORIF. OT to address UB strength, funcitonal endurance, and ADL retraining in order to facilitate patient's safe and sustainable discharge to home at [facility].   Prognosis Excellent   Problem List Decreased ADL status;Decreased endurance;Decreased cognition;Decreased functional mobility;Decreased IADLs   Activity Tolerance   Activity Tolerance Patient limited by pain   Dyspnea Occurrence NA   OT Plan   Treatment Interventions ADL Retraining;Functional Transfer Training;UE Strengthening/ROM;Endurance Training;Patient/Family Training;Equipment Evaluation/Education;Neuromuscular Reeducation   OT Frequency 5x/wk   Duration    (30 days)   Functional Reporting   Functional Limitations were determined by: Professional clinical judgement         Plan:     Care Plan Goals  OT Rehab  Goals: (P) Bathing Goal, Home Management Goal, LB Dressing Goal, Strength Goal  OT Plan  Treatment Interventions: (P) ADL Retraining, Functional Transfer Training, UE Strengthening/ROM, Engineer, production, IT trainer, Equipment Evaluation/Education, Neuromuscular Reeducation  OT Frequency: (P) 5x/wk  Duration : (P)  (30 days)        Cathleen Fears, OT   03/18/2022, 15:17    Everardo All, MD

## 2022-03-18 NOTE — PT Evaluation (Signed)
[facility]  [place], [place] [address]  Rehabilitation Services  Physical Therapy Initial Evaluation       03/18/22 [phone]   Therapist Pager   PT Assigned/ Pager # Ulis Rias, PT, DPT [phone]   General   Type of Evaluation Evaluation   Chart Reviewed Yes   Recommendation   Recommendation Short-term skilled PT   Functional Reporting   Functional Limitations were determined by: Professional clinical judgement;Standardized testing (Comment)     Patient Name: Sheri Garza   Date of Birth: 02/07/1935  Height: Height: 149.9 cm ('4\' 11"'$ )  Weight: Weight: 45.4 kg (100 lb)  Room/Bed: 220/A  Payor: Payor: MEDICARE / Plan: MEDICARE PART A AND B / Product Type: Medicare /     Encounter Start Date:  03/17/2022   Inpatient Admission Date: 0000000  Certification From: [id number]        To: 04/15/2022    Admission Diagnosis: Aftercare for healing traumatic closed fracture of left hip [S72.002D]  Medical Diagnosis Codes:   Active Hospital Problems    Diagnosis    Acute blood loss as cause of postoperative anemia [D62]    Atrophy of muscle of multiple sites [M62.59]    Physical deconditioning [R53.81]    Aftercare for healing traumatic closed fracture of left hip [S72.002D]      Therapy Diagnosis Codes: PT Diagnosis Codes: M62.81 (muscle weakness), R26.89 (gait abnormality, and R26.81 (unsteadiness on feet)     The patient was seen this session for:   55 Individual billable minutes and 30 evaluation minutes    The patient was seen this session for 85 total billable minutes.    The following procedures were performed THIS session: PT Evaluation: moderate complexity [id number]), Therapeutic Activity, ea 15 min ([id number]), and Gait Training, ea 15 min [id number])    Reason for referral:    Reasons for Referral: compromised physical exertion levels during activity, decrease in functional mobility, functional limitations in ambulation, decrease in strength, increased need of assistance from others, falls/fall risk, reduced  static balance, reduced dynamic balance, and reduced ADL participation    History of present illness:    Sheri Garza is a 87 y.o. female Aftercare for healing traumatic closed fracture of left hip [S72.002D].  Tshe has a history of osteoporosis, hyperlipidemia, macular generation.  Presented to emergency department with complaints of left hip pain after suffering a mechanical fall.  Imaging showing left intertrochanteric fracture and patient was transferred to [facility] for orthopedic evaluation.  Underwent ORIF of the left hip on 03/04/2022. Patient has worked with physical therapy but pain seems to be hindering physical therapy.  Patient is very hesitant to take any pain medication.  Was finally agreeable with Tylenol which she states helps some but is still having considerable pain. She also developed mild acute blood loss anemia secondary to surgery but hemoglobin became stable.  Physical therapy evaluated and recommending inpatient rehab.  Patient was stable for discharge and admitted to this SNU for ongoing medical mngt and rehab services.      Social History     Social History Narrative    Not on file       Patient Active Problem List   Diagnosis    Right Knee OA    Thyroid nodule    Spinal stenosis    Hyperlipidemia    Avitaminosis D    Back pain    DDD (degenerative disc disease), cervical    Insomnia    Kyphoscoliosis and scoliosis  Lumbar spinal stenosis    Osteoarthrosis    Aftercare for healing traumatic closed fracture of left hip    Acute blood loss as cause of postoperative anemia    Atrophy of muscle of multiple sites    Physical deconditioning       Past Medical History:   Diagnosis Date    Back pain     DDD (degenerative disc disease), cervical     Dry eye     Fracture of nasal bones     Hypertriglyceridemia     Insomnia     Kyphoscoliosis and scoliosis     Lumbar spinal stenosis     Osteoarthrosis     Osteoporosis     Spinal stenosis     Thyroid nodule     Vitamin D deficiency         Past Surgical History:   Procedure Laterality Date    HX CATARACT REMOVAL Left     HX CHOLECYSTECTOMY      HX COLONOSCOPY      HX DILATION AND CURETTAGE      HX OTHER      trigger finger, repair quad    HX TONSILLECTOMY      HX WISDOM TEETH EXTRACTION         Prior Level of Function:     Living Environment   Lives With alone   Living Arrangements house   Home Accessibility stairs to enter home;stairs within home   Living Environment Comment Pt. reports living in 2 story home w/ 2 steps to enter and 10 steps to 2nd floor but can live on first floor.   Stairs Within Home, Primary   Stairs, Within Home, Primary 10   Home Main Entrance   Number of Stairs, Main Entrance two   Functional Level Prior   Ambulation 1 - assistive equipment   Transferring 1 - assistive equipment   Toileting 0 - independent   Bathing 0 - independent   Dressing 0 - independent   Eating 0 - independent   Communication 0 - understands/communicates without difficulty   Prior Functional Level Comment Pt. reports using cane for ambulation and being indep. in ADLs.     Subjective:   Patient report:     Numeric pain scale:  5/10 left hip pain       Comments:  Pt states she desires to improve as quick as possible so she can RTN home soon. She is pleasant and cooperative.     Shortness of breath or trouble breathing with:    Lying flat- no    Sitting at rest- no               Exertion- no    Precautions/Contraindications:     Precautions:  Fall risk     Barriers to learning:  Age related, Visual, and Physical       Weight bearing status;      Location:  Left lower extremity      Weight bearing status:  Weight bearing as tolerated      Allowed weight:  100%    Objective:      Cognition:  Person, Place, Time, and Situation     Communication ability:  good       Range of Motion     RLE ROM WNL?  yes     LLE ROM WNL?  no hindered by pain          Strength     RLE strength  WNL?  yes  MMT : 4+     LLE strength WNL?  no   MMT : 3+       Sensation:   Intact     Skin integrity/edema:  (L) surgical LE appears edematous compared to the (R) LE. This may hinder her movement/flexibility of (L) LE.    Functional Ability     Assistive devices used with bed mobility:  Bed rail     Rolling:  Independent     Supine - Sit:  Independent     Sit - Supine:  Independent     Sitting balance:  Normal     Sit - stand:  Contact guard assist     Stand balance:  Fair     Bed - Chair/ Chair - bed:  Contact guard assist    Ambulation     Ambulation surface:  Ambulation surfaces : Level     Ambulation DistanceAmbulation Distance : : 159f     Ambulation ability:  Contact guard assist     Assistive devices:  Ambulation Assistive Device: Rolling walker     Ability to maintain weight bearing status:  Fair     Ambulation tolerance:  Fair     Comments:     Stairs    Ability to manage stairs:  NA at eval     Comments:  Pt reports 10 stairs at home.    PT Tests: 30 second Chair Rise, modified with UE support : 5                        TUG : Greater than 40seconds        2MWT : 713f             Interventions this session     PT INTERVENTIONS: ,  and to receive training and education on promoting improved sequencing and coordination techniques., and    for training patients whose walking abilities have been impaired by neurological, muscular, or skeletal abnormalities or trauma.    Assessment:    Clinical Impressions/Reasons for skilled PT: Patient presents with UNDERLYING IMPAIRMENTS : decreased dynamic balance, strength impairments, safety awareness deficits, muscle disuse atrophy, proximal instability, decreased static balance, and  joint mobility/integrity deficits, which requires skilled PT services to SKILLED PT SERVICES : analyze gait patterns, assess functional abilities, teach compensatory/adaptation techniques, assess need for environmental adaptations, increase LE strength and ROM, minimize falls, increase (I) with gait, promote safety awareness, increase awareness of environmental  hazards, analyze/instruct on HEP, increase functional activity tolerance, enhance rehab potential, establish and instruct in compensatory strategies, evaluate need for assistive device, facilitate discharge planning, facilitate (I) in all functional mobility, and improve dynamic balance in order to progress to QUALITY OF LIFE : decrease level of assistance from caregivers, facilitate increased participation with functional daily activities, facilitate safe transition to next level of care, increase performance skills with functional tasks, perform functional mobility with reduced risk of falls, prepare for/initiate ambulation, relieve pressure for decreased risk of skin breakdown, and return to PLOF abilities. Without skilled interventions, the patient is at further risk for: RISK FACTORS for PT : decreased ability to return to prior living environment, decrease in level of mobility, falls, decrease in participation of functional tasks, decreased skin integrity, further decline in function, immobility, increased dependence on caregivers, limited out of bed activity, muscle atrophy, and pressure sores.    Plan of treatment:   Potential for achieving Rehab goals: Patient demonstrates  GOOD/FAIR/POOR: Good rehab potential as evidenced by [facility] : ability to follow multi-step directions, ability to retain new information, ability to make needs known, active participation in skilled interventions, insight regarding functional deficits, motivated to participate, and motivated to return to PLOF        Goals:     Learning goals:  Exercise, Gait, Precaution/Safety, Balance activities, and Transfers/mobility          Physical therapy SHORT TERM goals 2 weeks:   1. Patient will exhibit bed mob supine<>sit to Standby assist in order to prepare for OOB activity, sitting EOB for ADLs and preparing for transfers/walking.   2. Patient will improve ability to safely and efficiently transfer to<>bed/chair with  Standby assist with implementation of compensatory strategies and with recognition of safety hazards.  3. Patient will safely ambulate on Level Ambulation Distance: 247f with Standby assist and Assistive Devices used: Front wheeled walker to exhibit increased efficiency and safety with lower risk of falls during daily functional tasks and increased (I) performance.   4. Patient will negotiate 5 stairs with  Contact guard assist to prepare for RTN to home and community.  5. Patient will perform modified, with UE support, 30second Chair Rise: 7 to reveal enhanced functional strength, balance and activity tolerance to reduce risk of falls and improved mobility efficiency.  6. Patient will decrease risk of falls and reveal enhanced coordination and functional mobility by improved score on TUG to: 38 sec.   7. Patient will exhibit enhanced functional activity tolerance and mobility efficiency through 2MWT : 1059f      Physical therapy LONG TERM goals 4 weeks:  1. Patient will exhibit bed mob supine<>sit to (I) in order to prepare for OOB activity, sitting EOB for ADLs and preparing for transfers/walking.   2. Patient will improve ability to safely and efficiently transfer to<>bed/chair with (I) with implementation of compensatory strategies and with recognition of safety hazards.  3. Patient will safely ambulate on Level Ambulation Distance: 50058fith Standby assist and Assistive Devices used: Front wheeled walker to exhibit increased efficiency and safety with lower risk of falls during daily functional tasks and increased (I) performance.   4. Patient will negotiate 10 stairs with  Contact guard assist to prepare for RTN to home and community.  5. Patient will perform modified, with UE support, 30second Chair Rise: 10 to reveal enhanced functional strength, balance and activity tolerance to reduce risk of falls and improved mobility efficiency.  6. Patient will decrease risk of falls and reveal enhanced coordination  and functional mobility by improved score on TUG to: 30sec.   7. Patient will exhibit enhanced functional activity tolerance and mobility efficiency through 2MWT : 125f81f  Patient to be seen:     (PT)Frequency: 5x/wk x 4wks       PT treatment approaches may include: PT Evaluation: moderate complexity ([id number] Exercise, ea 15 min (97110), Therapeutic Activity, ea 15 min ([id number] Training, ea 15 min ([id number] re-ed, ea 15 min ([id number] Manual Therapy, ea 15 min ([id number] Patient and/or family Goals/Expectations: Realistic       PT Recommendations for Nursing:  Pt benefits from one person support for ambulation in room using RW.    Additional information:     Anticipated rehab needs at discharge:  Home health services     Patient has been advised of PT diagnosis, goals and plan:  Yes     Patient/family  has given verbal consent for eval, treatment: and plan of care Yes    Is the patient appropriate for skilled PT at this time?  yes  Mobility GG section completed yes    Therapist:   Ulis Rias, PT,  03/18/2022   Everardo All, MD

## 2022-03-18 NOTE — Nurses Notes (Signed)
Resident has 3 surgical incisions noted to left hip, all well approximated. No signs or symptoms of infection noted. Area cleansed and applied allvyn dressings at this time. Will continue to observe for any changes.

## 2022-03-19 ENCOUNTER — Other Ambulatory Visit: Payer: Self-pay

## 2022-03-19 ENCOUNTER — Inpatient Hospital Stay: Payer: Medicare Other | Attending: Physician Assistant

## 2022-03-19 ENCOUNTER — Encounter (SKILLED_NURSING_FACILITY): Payer: Self-pay

## 2022-03-19 DIAGNOSIS — R609 Edema, unspecified: Secondary | ICD-10-CM | POA: Insufficient documentation

## 2022-03-19 DIAGNOSIS — Z79899 Other long term (current) drug therapy: Secondary | ICD-10-CM

## 2022-03-19 DIAGNOSIS — R600 Localized edema: Secondary | ICD-10-CM

## 2022-03-19 DIAGNOSIS — M7989 Other specified soft tissue disorders: Secondary | ICD-10-CM

## 2022-03-19 NOTE — PT Treatment (Signed)
[facility]  [place], [place] [address]  Rehabilitation Services  Physical Therapy Treatment Note    Patient Name: Sheri Garza  Date of Birth: 1935-01-16  Height:  149.9 cm (4\' 11" )  Weight:  45.4 kg (100 lb)  Room/Bed: 220/A  Payor: MEDICARE / Plan: MEDICARE PART A AND B / Product Type: Medicare /     Date/Time of Admission: 03/17/2022  4:27 PM  Admitting Diagnosis:  Aftercare for healing traumatic closed fracture of left hip [S72.002D]    The patient was seen this session for: PT Individual Therapy Minutes: 70 total individual billable minutes    The patient was seen this session for 70 total billable minutes.    The following procedures were performed this session: Therapeutic Exercise, ea 15 min (97110), Therapeutic Activity, ea 15 min (56433), and Gait Training, ea 15 min (29518)    Subjective:   Patient report: Pt reporting her (L) LE continues to be significantly painful and difficult to move or use functionally. PA is present and opts to recommend an [place] of her (L) LE to rule out/in a DVT. Pt is pleasant and cooperative with periods of confusion and anxiousness and agitation at times.     Shortness of breath or trouble breathing with:   Lying flat- no   Sitting at rest- no  Exertion- no    Patient with c/o 6/10 pain.    Objective:      Interventions this session     PT INTERVENTIONS: THEREX (84166) : NuStep to [facility] reason : active, assisted, or passive range of motion for improved joint mobility., THERACT (06301) : attending care plan meeting with res and interdisciplinary team to consult on res current status and anticipated POC, transfer training to increase functional task performance, facilitation of postural control, techniques to facilitate body scheme/awareness, and ROM techniques to increase functional task performance and to receive training and education on promoting improved sequencing and coordination techniques., and GAIT TX (60109) pre-gait : weight shift during  mobility GAIT TX (32355) : training in correct hand/foot placement during gait, training in correct sequencing of gait with AD to increase safety, weight shift during mobility, and with emphasis on increasing safety and performance within pts room GAIT TX reason : educate and train on effective sequencing and completion of ambulation around obstacles and in open environments , necessitates reeducation in ambulation after suffering musculoskeletal trauma, such as fractures or injuries., instruction on using mobility aids like walkers, crutches, or canes., and Instructing caregivers on appropriate guarding and assistive techniques to support patients during ambulation. for training patients whose walking abilities have been impaired by neurological, muscular, or skeletal abnormalities or trauma.     Exercise    Location:  bilaterallower extremity (ies)    Exercises performed:  NuStep for 15 minutes    Comments:  L1 with emphasis on increasing (L) knee flexion.    Functional Ability     Assistive devices used with bed mobility:  Bed rail and Leg lifter     Rolling:  Independent     Supine - Sit:  Contact guard assist     Sit - Supine:  Contact guard assist     Sitting balance:  Normal     Sit - stand:  Contact guard assist     Stand balance:  Fair     Bed - Chair/ Chair - bed:  Contact guard assist     Comments:  Bed mob impacted greater this date by (L) LE  pain hindering movement. Utilizing leg lifter to assist.     Ambulation     Ambulation surface:  Ambulation surfaces : Level     Ambulation DistanceAmbulation Distance : :163ft, 21ftx2     Ambulation ability:  Contact guard assist     Assistive devices:  Ambulation Assistive Device: Rolling walker     Ability to maintain weight bearing status:  Fair. Complains of pain limiting full weight bearing.     Ambulation tolerance:  Fair     Patient Fall Risk Reduction and Safety Strategies: Call bell in reach.     Assessment:   Pt is being seen 5x/wk in SNU for rehab  following a hospital stay for (L) hip fracture repair resulting in overall mobility decline as needed for home mngt.  Pt to be participating in daily gait training, transfer/mobility training, strengthening/ROM and endurance & balance training.  Pt is anticipated to progress toward POC goals. She is impacted at this time by pain to (L) LE that limits activity tolerance and effectiveness of PT interventions due to pt reaction to pain.   Plan:   Continue to follow according to established plan of care.    Therapist:   Ulis Rias, PT,  03/19/2022

## 2022-03-19 NOTE — Progress Notes (Signed)
[facility] Hospitalist   Skilled Nursing Facility Note    Location: [facility]  Note type: Progress    PATIENT NAME: Sheri Garza  MRN: [id number]  DOB: [birth year]  Date of Service: 03/19/2022  CC: Left Lower Leg Swelling and Pain      SUBJECTIVE/HPI: Patient motivated to participate in PT/OT and get back home as soon as possible. She continues to have LLE pain that she reports is "everywhere", not just in her left hip/upper leg. She also has swelling in her LLE from hip to foot that was reported as present since she was at [facility] prior to transferring her rehab car to [facility]. She is hesitant to take any medications, including Tylenol, but was encouraged to stay consistent to help with pain and ability to work with therapy. Her ORIF was on 03/04/22 and she is WBAT, however she has been limited by her pain in LLE. Discussed risk/benefit of pursuing LLE Venous Duplex US to evaluate for DVT in setting of significant LLE swelling and pain and if DVT present that would mean she would need a full dose blood anticoagulation and not DVT prophylaxis for which she has been taking ASA 81 mg BID since her surgery. She is agreeable to Duplex to rule out/in DVT.      Otherwise, she has no other complaints. No fever, chills, SOB, CP, abdominal pain. Updated patient on current plan of care and all questions/concerns were addressed.      Past Medical History:  Patient Active Problem List    Diagnosis Date Noted    Orthopedic aftercare 03/18/2022    At risk for malnutrition 03/18/2022    Left hip pain 03/18/2022    Acute blood loss as cause of postoperative anemia 03/17/2022    Atrophy of muscle of multiple sites 03/17/2022    Physical deconditioning 03/17/2022    Aftercare for healing traumatic closed fracture of left hip 03/03/2022    Back pain     DDD (degenerative disc disease), cervical     Insomnia     Kyphoscoliosis and scoliosis     Lumbar spinal stenosis     Osteoarthrosis     Avitaminosis D  09/14/2018    Thyroid nodule 04/01/2016    Spinal stenosis 04/01/2016    Hyperlipidemia 04/01/2016    Right Knee OA 09/21/2012       Past Surgical History:  Past Surgical History:   Procedure Laterality Date    HX CATARACT REMOVAL Left     HX CHOLECYSTECTOMY      HX COLONOSCOPY      HX DILATION AND CURETTAGE      HX OTHER      trigger finger, repair quad    HX TONSILLECTOMY      HX WISDOM TEETH EXTRACTION       Family History:  Family Medical History:       Problem Relation (Age of Onset)    Cancer Other, Father, Brother    Diabetes Other, Mother    HTN <20 y.o. Other    Heart Disease Mother          Social History:  Social History     Tobacco Use    Smoking status: Former    Smokeless tobacco: Never   Substance Use Topics    Alcohol use: No    Drug use: No       Medications:       aspirin (ECOTRIN) enteric coated tablet 81 mg 81 mg 2x/day  Carboxymethylcell-Glycerin(PF) 0.5-0.9 % Dropperette 1 Drop 1 Drop 5x/day    multivitamin (PRESERVISION AREDS 2) with minerals capsule 1 Capsule 2x/day-Food    tuberculin PPD 5 units/0.1 mL intradermal injection 0.1 mL Once    [START ON 03/27/2022] tuberculin PPD 5 units/0.1 mL intradermal injection 0.1 mL Once      acetaminophen (TYLENOL) tablet, 650 mg, Q4H PRN    dextromethorphan-guaiFENesin (ROBITUSSIN DM) 10-100mg  per 22mL oral liquid, 10 mL, Q4H PRN    magnesium hydroxide (MILK OF MAGNESIA) 400mg  per 7mL oral liquid, 15 mL, 4x/day PRN    melatonin tablet, 3 mg, HS PRN - MR x 1    ondansetron (ZOFRAN ODT) rapid dissolve tablet, 4 mg, Q6H PRN    sennosides-docusate sodium (SENOKOT-S) 8.6-50mg  per tablet, 1 Tablet, 2x/day PRN    Allergies:  Allergies   Allergen Reactions    Antihistamine [Diphenhydramine Hcl] Swelling    Penicillins      Rash, Dizziness    Vancomycin  Other Adverse Reaction (Add comment)     Phlebitis    Flagyl [Metronidazole]     Gentamicin     Iodine And Iodide Containing Products     Lidocaine      Except xylocaine. Bystrom taking that    Other       Antihistamines and all caines, Pt states Xylocaine is ok    Tetracycline        Review of Systems:   Other than ROS in the HPI, all other systems were negative.      OBJECTIVE    Vitals:    03/17/22 1651 03/18/22 0500 03/18/22 1500 03/19/22 0550   BP: 118/67 130/64 101/62 (!) 120/58   Pulse: 90 80 97 93   Resp: 18 18 18 18    Temp: 36.6 C (97.9 F) 36.7 C (98.1 F) 36.6 C (97.8 F) 36.9 C (98.4 F)   SpO2: 97% 97% 94% 94%   Weight: 45.4 kg (100 lb)      Height: 1.499 m (4\' 11" )      BMI: 20.24        PHYSICAL EXAMINATION:   Constitutional: Well developed, well nourished female. Mild distress due to LLE pain.   Integumentary: Warm, dry. Left medial ankle and area superior to medial ankle with edema and skin erythema. No significant warmth out of proportion to RLE.   HEENT: NCAT.    Cardiovascular: DP pulse LLE 2+. 2+ pitting edema LLE.   Respiratory: Normal work of breathing. No accessory muscle use. On room air.   Neurological: AAO x 3. No focal deficits. Movement grossly present in all extremities. Sensation grossly intact in all extremities. Speech intact and appropriate.   Psychiatric: Normal mood and affect. Cooperative with exam. Good judgement and insight.       ASSESSMENT AND PLAN:    Sheri Garza is a 87 y.o. female seen today for LLE swelling and continued pain in entire LLE despite being 2 weeks post-op from left hip ORIF.     Left Intertrochanteric fracture s/p ORIF (aftercare) / Left Lower Extremity Swelling (present on admission)  Continue aggressive PT/OT  Continue ASA for 21 days post op--patient has been complaint. End Date: 04/03/22.   Duplex Venous US LLE ordered to evaluate for DVT which would have been present on admission to our facility. If positive for DVT will discuss full dose anticoagulation with patient. If negative, can continue ASA BID.   Ok to continue to work with PT/OT while Duplex US pending completion.   Continue PRN  tylenol for pain along with PRN Ice/Heat.  WBAT  Post-op  follow-up with Orthopedics at [facility] on 03/27/22     At Risk for malnutrition  Consult nutrition / dietician with MNT protocol.   Monitor bowel functions.  Wt Readings from Last 3 Encounters:   03/17/22 45.4 kg (100 lb)   03/03/22 45.8 kg (100 lb 15.5 oz)   03/03/22 44.5 kg (98 lb)   Body mass index is 20.2 kg/m.  Lab Results   Component Value Date    ALBUMIN 2.5 (L) 03/08/2022      Macular Degeneration  Continue Preservision BID and eye drops for dry eyes as needed.         Audie Pinto, PA-C 03/19/2022, 07:58     I reviewed the physician assistant note. I have discussed the case with the PA. I agree with the findings and plan of care as documented in the physician assistant's note.  Any exceptions/additions are edited/noted.    Valeria Batman, MD  Assistant Professor   Department of Internal Medicine

## 2022-03-19 NOTE — Care Management Notes (Signed)
[facility] Skilled Nursing Facility  Care Management Note    Patient Name: Sheri Garza  Date of Birth: 18-Mar-1935  Sex: female  Date/Time of Admission: 03/17/2022  4:27 PM  Room/Bed: 220/A  Payor: MEDICARE / Plan: MEDICARE PART A AND B / Product Type: Medicare /    LOS: 2 days   Primary Care Providers:  [facility] [place] (General)    Admitting Diagnosis:  Aftercare for healing traumatic closed fracture of left hip [S72.002D]    Assessment:      03/19/22 1444   Assessment Details   Assessment Type Continued Assessment   Date of Care Management Update 03/19/22   Care Management Plan   Discharge Planning Status plan in progress   Projected Discharge Date 04/15/22   Discharge plan discussed with: Patient   Discharge Needs Assessment   Equipment Currently Used at Home cane, straight   Equipment Needed After Discharge other (see comments);shower chair;commode;hospital bed  (FWW)   Discharge Facility/Level of Care Needs Home with Home Health and DME (code 6)   Transportation Available family or friend will provide     MSW met with [facility] Team for Admission [place]. IDT present: PT, OT, MSW, and Activities Coordinator. Pt was also present.     Current condition and therapy goals were discussed. Insurance and Medicare covered days were discussed. Pt's 20th day will be 04/05/22. No specific discharge date was determined at this time. Pt has a follow up ortho appointment scheduled for 3/14 in which her son Aaron Edelman will provide transportation. Pt would like to discuss discharge plans after that appointment. Plan is for pt to discharge to her grandson's home in [place] at time of discharge. Per pt, this will only be temporary with intentions of returning to her home in [place].     MSW received a phone call from pt's son, Aaron Edelman. Per pt, Aaron Edelman is her MPOA. MSW requested pt bring in MPOA documentation during his next visit so it can be updated in her chart.     Discharge Plan:  Home with Home  Health and DME (code 6)  Pt continues to be medically managed and is participating in rehab. MSW will continue to monitor progress and decide with evolvement of healthcare team and pt when pt is safe for discharge.     The patient will continue to be evaluated for developing discharge needs.     Case Manager: Dorna Bloom, Olivia Lopez de Gutierrez  Phone: [phone]

## 2022-03-19 NOTE — OT Treatment (Signed)
[facility]  Rehabilitation Services  Occupational Therapy Treatment Note    Patient Name: Sheri Garza  Date of Birth: May 08, 1935  Height:  149.9 cm (4\' 11" )  Weight:  45.4 kg (100 lb)  Room/Bed: 220/A  Payor: MEDICARE / Plan: MEDICARE PART A AND B / Product Type: Medicare /     Date/Time of Admission: 03/17/2022  4:27 PM  Admitting Diagnosis:  Aftercare for healing traumatic closed fracture of left hip [S72.002D]    The patient was seen this session for individual treatment minutes OT Individual Therapy Minutes: 45 and   0 co-treatment therapy minute as total billable minutes.  The following procedures were performed this session OT Treatment, ea 15 min (63016).      Subjective:     Patient with c/o LLE unrated on Likert, mild-moderate pain. "I just need my leg to be better. I can do a lot for myself, but my leg hurts!"      Objective:   ADL Retraining: Minutes- 3: Patient participated in ADL retraining to improve function and safety by means of simulated or direct practice with cues and physical assist as needed, active problem solving with identification of risks and opportunities, incorporation of adaptive techniques and compensatory strategies, and energy conservation/ work simplification skills. Patient participated in extensive education re OT POC. Pt had been scheduled for AM ADL session with OT this morning, had discussed at length with pt yestreday afternoon with good understanding verbalized at that time. Upon entry this AM, pt states that she already got herself dressed without help and had no recall of vast majority of items discussed yesterday, demonstrated clear lack of understanding of OT's role, and was agitated regarding oatmeal without sugar. Pt required MAX encouragement to accept OT's assistance to improve palatability of breakfast meal by retrieving sugar packets- pt states 'I dont want anyone to have to do anything for me. I'll just not eat.' Extensive  encouragement and edu re: importance of allowing staff to assist her not only with meal preferences but with ADLs and mobility.  Edu re: importance of adequate nutrition, which is needed for her LLE to heal, pt then agreeable to o eating a muffin, which was requested from kitchen.    Edu re: ADLs- Pt with lack of insight into deficits, fall risks, and situation. When asked 'what do you need to be able to do at home that you can't currently do?' Pt responds repeatedly with 'my leg needs to get better. I can do everything for myself. I need therapy.' Pt unable to identify what specific 'therapy' she is hoping to participate in, i.e. programmed exercises, ambulation, but just repeats that her leg needs to get better and she can do everything on her own.  After several minutes of redirection to task and orientation to present situation, pt endorses difficulty with BADLs with MAX ETE and need for extensive rests during and after dressing, verbalizes understanding of OT's role in improving her safety and indep with BADLs and IADLs and is agreeable to participate in dressing ADL after given time to eat muffin. Pt then unable to participate in planned treatment due to PT treatment, followed by care plan meeting, followed by visitation from loved ones, and then request to rest this afternoon. Pt states she is agreeable to OT ADL intervention tomorrow, but appeared to have only limited recall of this morning's session.   *At end of session call bell and phone within reach, waffle cushion in place in  chair, feet supported on the floor / heels off-loaded.   Shortness of breath or trouble breathing with exertion (e.g. Walking, bathing, transferring): n  Shortness of breath or trouble breathing when sitting at rest: n  Shortness of breath or trouble breathing when lying flat:  n    Assessment:   Patient demonstrates Poor tolerance for OT treatment this date due to cognitive barriers impacting planned intervention as well as  fatigue preventing PM session. SLP is following patient for cognition.    Plan:   Continue to follow according to established plan of care.    The risks/benefits of therapy have been discussed with the patient/caregiver and he/she is in agreement with the established plan of care.     Therapist:   Cathleen Fears, OT,  03/19/2022

## 2022-03-19 NOTE — Nurses Notes (Signed)
Alert and oriented. Border dressing times 3 to left hip intact with no drainage. Left leg swollen and inner ankle red and warm to touch. Ice applied as ordered to hip and ankle prn. Up to bathroom with walker and assitance of one staff member. Encouraged to use spirometer but expressed she did not want to. Educated on the benefits and used it a few times with much encouragement. Lungs sound clear. Took meds whole with water but states if they are large she needs them in applesauce. Call bell in reach at all times.

## 2022-03-19 NOTE — Nurses Notes (Signed)
Resident returned to unit from Venous Doppler testing. Resident tolerated testing well.

## 2022-03-19 NOTE — Nurses Notes (Signed)
New order received to have Venous Doppler r/t swelling and redness in left leg and ankle. Resident expressed understanding. Spoke with [place] in radiology and was called back at 0830 for resident to come down for Doppler. Resident currently at Radiology. CNA transported resident in w/c. Marland KitchenRodena Medin, LPN

## 2022-03-19 NOTE — Nurses Notes (Signed)
Discussion with resident shows she does not need an interpreter; preferred language is [place]; ethnicity is no not of Hispanic, Latino/a, or Spanish origin; race is white.Resident wears glasses and her vision is adequate with them and sees small print. Hearing is adequate. Wears no hearing aides. Resident has own teeth. Broken teeth on lower gum noted. Resident states they are not bothering her and no issues at this time and follows with a dentist out in the community. Resident is understood by others and understands others. Resident noted to be wearing a gold watch, gold ring to right hand and gold ring with white gold to left hand.

## 2022-03-19 NOTE — Nurses Notes (Signed)
Resident alert and orineted times four, verbally makes needs known. Resident is a standby with wheeled walker  with transfers. Resident left hip drsg changed per shift, clean,dry and intact. Resident refuses to take any tylenol for leg pain. Resident eats regular diet and takes meds whole in applesauce. Resident had a venous doppler performed per shift r/t swelling and redness to left ankle. PA reviewed test results and no new orders for resident. Resident tolerated and participated in therapy per shift. Resident continent.BP 110/60   Pulse 90   Temp 36.7 C (98 F)   Resp 18   Ht 1.499 m (4\' 11" )   Wt 45.4 kg (100 lb)   SpO2 94%   BMI 20.20 kg/m .Rodena Medin, LPN

## 2022-03-19 NOTE — Care Management Notes (Signed)
[facility] Management Initial Evaluation    Patient Name: Sheri Garza  Date of Birth: 08/09/1935  Sex: female  Date/Time of Admission: 03/17/2022  4:27 PM  Room/Bed: 220/A  Payor: MEDICARE / Plan: MEDICARE PART A AND B / Product Type: Medicare /   Primary Care Providers:  [place], [place] (General)    Pharmacy Info:   Preferred Pharmacy       [id number] [place], [place] - [place]    [address] [place] [address]    Phone: [phone] Fax: [phone]    Hours: Not open 24 hours    [place], [place] - [address]    [address] [place] [address]    Phone: [phone] Fax: [phone]    Hours: Not open 24 hours          Emergency Contact Info:   Extended Emergency Contact Information  Primary Emergency Contact: Sheri Garza [place] Phone: [phone]  Work Phone: [phone]  Mobile Phone: [phone]  Relation: Son    History:   Sheri Garza is a 87 y.o., female, admitted for Aftercare for healing traumatic closed fracture of left hip [S72.002D]    Height/Weight: 149.9 cm (4\' 11" ) / 45.4 kg (100 lb)     LOS: 2 days   Admitting Diagnosis: Aftercare for healing traumatic closed fracture of left hip [S72.002D]    Assessment:      03/19/22 1157   Assessment Details   Assessment Type Admission   Date of Care Management Update 03/19/22   Date of Next DCP Update 03/21/22   Living Environment   Lives With alone   Living Arrangements house   Able to Return to Prior Arrangements other (see comments)  (Will discharge to her grandson's home in [place])   Care Management Plan   Discharge Planning Status plan in progress   Projected Discharge Date 04/15/22   Discharge plan discussed with: Patient   Discharge Needs Assessment   Equipment Currently Used at Home cane, straight   Equipment Needed After Discharge other (see comments)  (FWW)   Discharge Facility/Level of Care  Needs Home with [place] and DME (code 6)   Transportation Available family or friend will provide   Referral Information   Address Verified verified-no changes   Arrived From rehab facility   [place]   Does the Patient have an Advance Directive? Yes, Patient Does Have Advance Directive for Healthcare Treatment   Copy of Advance Directives in Chart? No, Copy Requested From Patient     Pt is an 87 year old female from [place] with a PMH of osteoporosis and hyperlipidemia. She was admitted to the [facility] for left intertrochanteric fracture. Pt was admitted to [facility] from 2/19 to 2/23 and then was discharged to the [facility] from 2/23 to 3/4 when she was transferred to the [facility].     Prior to her fall pt lived alone in her 3 story home. Pt reports having 2 small stairs to enter through the back of her home. She has a second floor with 10 stairs and then a finished basement with 8 stairs; all have 1 railing. MSW verified pt has all working utilities. Pt states that at time of discharge she plans on going to stay with her grandson in [place].    Pt has Medicare primary and  the Health Plan as her secondary payer. Her Rx coverage is through [employer]. Pt states she prefers to use [facility] [place] but may need her medications sent to a pharmacy in [place]. Prior to fall, pt managed all of her medications herself. Her PCP is through the [facility] clinic and she was last seen about 3 months ago. Pt states she has never used a home health agency and her only DME is a straight cane she would use when walking through her yard.     Prior to hospitalization pt reports doing the following ADLs:   ACTIVITY & ASSISTANCE NEEDED  INDEPENDENT   NEEDS ASSISTANCE   ACTIVITY & ASSISTANCE NEEDED  INDEPENDENT   NEEDS ASSISTANCE     MEAL PREP   X   DRESSING    X     LAUNDRY  X   BATHING   X     CLEANING  X   TOILETING    X     GROCERY SHOPPING   X   TRANSFERS   X     DRIVING   X    PERSONAL HYGIENE   X     GETTING TO APPOINTMENTS    X   WALKING/WHEELCHAIR    X       Discharge Plan:  Home with Home Health and DME (code 6)  Pt continues to be medically managed and participating in rehab. Pt follows up with Ortho on 3/14; therapy will continue to work with pt on her rehab goals. Pt will need [place] set up at her grandson's address in [place]. Pt could also benefit from a FWW and a shower chair. Pt is declining the shower chair at this time.     GrandsonChristell Garza [address] [place] [address]    The patient will continue to be evaluated for developing discharge needs.     Case Manager: Dorna Bloom, [place]  Phone: [phone]

## 2022-03-20 ENCOUNTER — Other Ambulatory Visit: Payer: Self-pay

## 2022-03-20 DIAGNOSIS — R000 Tachycardia, unspecified: Secondary | ICD-10-CM

## 2022-03-20 DIAGNOSIS — R9431 Abnormal electrocardiogram [ECG] [EKG]: Secondary | ICD-10-CM

## 2022-03-20 LAB — ECG 12 LEAD
Atrial Rate: 101 {beats}/min
Calculated P Axis: 54 degrees
Calculated R Axis: 27 degrees
Calculated T Axis: 47 degrees
PR Interval: 160 ms
QRS Duration: 68 ms
QT Interval: 316 ms
QTC Calculation: 409 ms
Ventricular rate: 101 {beats}/min

## 2022-03-20 NOTE — Nurses Notes (Signed)
Resident stated that she had loose stools two nights ago and she just went to restroom and had one episode of loose stool per shift, PA Grayson notified and assessed pt. And she wants her stool tested, she c/o of nausea also and PA asked if she would take Zofran to help and resident stated "NO". Resident's stool sample sent for testing and resident placed in contact isolation r/t loose mucus stools, until testing is complete. Resident expressed understanding and is agreeable to the testing of stool.Rodena Medin, LPN

## 2022-03-20 NOTE — Care Plan (Signed)
[place]  [place]  Speech Therapy Progress Note    Patient Name: Sheri Garza  Date of Birth: October 18, 1935  Weight:  45.4 kg (100 lb)  Room/Bed: 220/A  Payor: MEDICARE / Plan: MEDICARE PART A AND B / Product Type: Medicare /      Date/Time of Admission: 03/17/2022  4:27 PM  Admitting Diagnosis:  Aftercare for healing traumatic closed fracture of left hip [S72.002D]        Assessment:  Cognitive communication deficits      Plan: ST to continue POC  Recommendations: Continue to address cognitive communication deficits to promote independence   Results & Recommendations Discussed With:Patient, Nurse, and MD   Continue to follow patient according to established plan of care.  The risks/benefits of therapy have been discussed with the patient/caregiver and he/she is in agreement with the established plan of care.       Subjective:  Flowsheet Info:     03/20/22 1451   Therapist Pager   SLP Pager Debe Coder [phone]   Rehab Session   Document Type therapy progress note (daily note)   Total SLP Minutes: 25   Patient Effort good   Symptoms Noted During/After Treatment none   General Information   Patient Profile Reviewed yes     Pt was awake, alert, upright in her recliner and breathing on room air comfortably.    Objective:  SLP facilitated cognitive linguistic tasks to maximize functional potential and improve safety and independence with daily routines. Pt was instructed in cognitive linguistic task targeting reasoning, organization, attention, and mental manipulation while comparing and contrasting pictures. Pt benefited from mod faded to min visual and verbal cues to complete task with 80% accuracy. SLP guided Pt in medication management task to address working memory and complex problem-solving skills. Pt with impaired task initiation, difficulty sustaining attention, and required mod-max repetition of instructions. Pt with improved accuracy as task completion progressed, benefiting from  mod visual and verbal cues/instructions to complete task with 80% accuracy. Pt was left in good condition with call light within reach.      Therapist:  Sandrea Hammond, ST   Pager #: [phone]  Phone #: [phone]  Treatment Time: 25 minutes

## 2022-03-20 NOTE — Nurses Notes (Signed)
Resident uses a FWW and wheelchair.

## 2022-03-20 NOTE — Nurses Notes (Signed)
Dietician Faith followed up on Ensure supplement that she had for dinner yesterday, to see if resident was willing to drink them. Resident states she would keep the ensure drink and let them know tomorrow. Rodena Medin, LPN'

## 2022-03-20 NOTE — Progress Notes (Signed)
Interval History:     Dizziness while sitting, not positional reported this morning. No other associated symptoms. Patient reported no significant pain in her left leg at that time. Vitals stable. EKG obtained showing sinus tachycardia with HR 101 without acute ischemic changes.     Later in evening reporting diarrhea with mild abdominal pain and nausea without vomiting. She reports she has been having loose stools for 2-3 days. Nursing reported solid BM earlier in morning. Patient saying stool studies were going to be sent 2-3 days ago while she was here. No stool studies or documentation of diarrhea/stool studies were sent. Will order C. Diff and GI bio fire to further investigate.     Patient is hesistant to take any medications. Zofran PRN avaliable if needed for severe nausea. Also recommended rubbing alcohol aroma therapy if she would like to avoid taking medications for nausea.     Patient continues to be hesitant in taking frequent Tylenol to help with left hip/leg pain. Venous Duplex LLE negative for DVT on 3/6. Also encouraged staying up on her pain control with Tylenol.     Will update patient's CBC/diff, BMP, and Mag tomorrow 3/8 AM to further evaluation diarrhea and dizziness.       Audie Pinto, PA-C    Everardo All, MD

## 2022-03-20 NOTE — Nurses Notes (Signed)
Resident c/o of some dizziness with OT. Resident had new orders for a 12Lead EKG and tolerated test well. Resident expressed understanding.  Rodena Medin, LPN

## 2022-03-20 NOTE — Nurses Notes (Signed)
Patient alert and oriented x 4. She takes her meds whole with water. She is ambulatory with walker to the bathroom with one assist. She takes Tylenol for hip pain when needed, but refused it for tonight along with her eye drops. She has some behaviors when she rings the call light and if she does not get an immediate response she starts to yell from the room for help. Will continue to monitor and call light within reach.

## 2022-03-20 NOTE — OT Treatment (Signed)
[facility]  Rehabilitation Services  Occupational Therapy Treatment Note    Patient Name: Sheri Garza  Date of Birth: 07/07/1935  Height:  149.9 cm (4\' 11" )  Weight:  45.4 kg (100 lb)  Room/Bed: 220/A  Payor: MEDICARE / Plan: MEDICARE PART A AND B / Product Type: Medicare /     Date/Time of Admission: 03/17/2022  4:27 PM  Admitting Diagnosis:  Aftercare for healing traumatic closed fracture of left hip [S72.002D]    The patient was seen this session for individual treatment minutes OT Individual Therapy Minutes: 20 and  0 co-treatment therapy minute as total billable minutes.  The following procedures were performed this session OT Treatment, ea 15 min (00511).      Subjective:     Patient with c/o LLE pain causing mobility to be difficult. Pt educated on benefits of utilizing PRN Rx (Tylenol) prior to rehab session and then requested her Tylenol.  In the afternoon, treatment attempted again with pt stating that she is too tired to participate in OT. Observable lack of recall of AM session and yesterday's session.       Objective:   ADL Retraining: Minutes- 20: Patient participated in ADL retraining to improve function and safety by means of simulated or direct practice with cues and physical assist as needed, active problem solving with identification of risks and opportunities, incorporation of adaptive techniques and compensatory strategies, and energy conservation/ work simplification skills. Patient approached already in bathroom, having recently completed toileting with aide, initiating oral care. Pt completed oral care and grooming at sink with SBA and Min VC's for safety with standing task, Min A needed to manage FWW when turning from sink to exit BR with pt nonresponsive to verbal and tactile cues re: FWW placement. Positioned for comfort in chair and education continued re: OT POC, OT's role, and pt to seek assistance with self care.        *At end of session call bell  and phone within reach, waffle cushion in place in chair, feet supported on the floor / heels off-loaded.   Shortness of breath or trouble breathing with exertion (e.g. Walking, bathing, transferring): n  Shortness of breath or trouble breathing when sitting at rest: n  Shortness of breath or trouble breathing when lying flat:  n    Assessment:   Patient demonstrates ongoing lack of recall between sessions, lack of understanding of OT POC, and generally impaired safety awareness.  CA reports pt completed WB dressing with SUA and occasional Min A.  Discussed concerns re: cognition with SLP.   Patient also underwent ECG today and reports diarrhea with medical team following and addressing.    Plan:   Continue to follow according to established plan of care.    The risks/benefits of therapy have been discussed with the patient/caregiver and he/she is in agreement with the established plan of care.     Therapist:   Cathleen Fears, OT,  03/20/2022

## 2022-03-20 NOTE — Nurses Notes (Signed)
IDT discussion of resident usual performance during look back period 3/4-03/19/2022 with review of medical record documentation and staff interview shows the following and MDS ARD April 10, 2022 will be coded to reflect this.   Prior level of function   Self care: Independent  Indoor mobility: Independent  Stairs: Independent  Functional Cognition: Independent  Prior Device Use: Cane     Self care   Eating:  setup Goal: independent  Oral Hygiene: setup  Goal: independent  Toileting Hygiene: Partial Goal: independent  Shower/bathe self: partial Goal: independent  Upper body dressing: setup Goal: independent  Lower body dressing: supervision or touching assistance  Goal: Independent  Putting on/taking off footwear:  Partial Goal: Independent  Personal Hygiene: Partial     Mobility   Roll left and right: supervision or touching assistance  Goal: Independent  Sit to lying:  supervision or touching assistance Goal:  independent  Lying to sitting on side of bed: supervision or touching assistance  Goal: independent  Sit to stand:  Partial Goal: Independent  Chair/bed-to chair transfer:  supervision or touching assistance Goal: Independent  Toilet transfer:  supervision or touching assistance Goal:   Independent  Tub/shower transfer: supervision or touching assistance  Car transfer: Not attempted due to medical condition or safety concerns   Goal: setup  Walk 10 feet: supervision or touching assistance  Goal: independent  Walk 50 feet with two turns:  supervision or touching assistance Goal: independent  Walk 150 feet:  Not attempted due to medical condition or safety concerns  Goal: setup  Walk 10 feet on uneven surfaces: Not attempted due to medical condition or safety concerns  Goal: supervision or touching assistance  1 step (curb): Not attempted due to medical condition or safety concerns   Goal: supervision or touching assistance  4 steps:   Not attempted due to medical condition or safety concerns  Goal: supervision or  touching assistance  12 steps: Not attempted due to medical condition or safety concerns   Goal: supervision or touching assistance  Picking up object: Not attempted due to medical condition or safety concerns  Goal: supervision or touching assistance  Did the patient use a wheelchair/scooter:  No

## 2022-03-20 NOTE — Nurses Notes (Signed)
Wound nurse RN in to look at a cyst on Left lower back. Notified PA. No new orders at this time, area noted to have small yellow in center. Resident states its been there for many years and her POC just wanted her to leave it alone .Sheri Medin, LPN

## 2022-03-20 NOTE — Nurses Notes (Signed)
Resident alert and oriented times four, verbally makes all needs known. During therapy per shift, resident c/o of dizziness and mucus in her stool. OT reported s/s and this nurse reported to PA. Resident had a 12 Lead EKG and PA noted nothing of concern. Resident stated it left after this am and was unchanged via changing positions. Resident did state she had mucus in her stool several days ago, this am she had a large formed bowel movement and did not note any. Resident still states she notes mucus in bowel a few days ago. PA made aware and states she will be assessing pt, per shift. No further new orders at this time. Resident eats regular diet and takes meds whole. Resident did c/o of pain 4/10 per shift in Left leg. Drsg to staple surgical sites changed per shift. No s/s of infection or drainage noted to surgical site. Resident tolerated and participated in therapy. Resident uses wheeled walker to transfer and continent of b/b. This shift a cyst was noted to left lower back and resident states it has been there for years and her PCP is aware of it. Notified PA of area. No new orders at this time. BP 122/60   Pulse (!) 109   Temp 36.7 C (98.1 F)   Resp 16   Ht 1.499 m (4\' 11" )   Wt 45.4 kg (100 lb)   SpO2 97%   BMI 20.20 kg/m .Rodena Medin, LPN

## 2022-03-20 NOTE — PT Treatment (Signed)
[facility]  [place], [place] [address]  Rehabilitation Services  Physical Therapy Treatment Note    Patient Name: Sheri Garza  Date of Birth: 1935/03/22  Height:  149.9 cm (4\' 11" )  Weight:  45.4 kg (100 lb)  Room/Bed: 220/A  Payor: MEDICARE / Plan: MEDICARE PART A AND B / Product Type: Medicare /     Date/Time of Admission: 03/17/2022  4:27 PM  Admitting Diagnosis:  Aftercare for healing traumatic closed fracture of left hip [S72.002D]    The patient was seen this session for: PT Individual Therapy Minutes: 60 total individual billable minutes    The patient was seen this session for 60 total billable minutes.    The following procedures were performed this session: Therapeutic Exercise, ea 15 min ([id number]), Therapeutic Activity, ea 15 min (88916), and Gait Training, ea 15 min (94503)    Subjective:   Patient report: Pt reporting her LE continues to have pain that limits activity levels. She is pleasant and cooperative for PT session and carries conversations, though she becomes off topic easily.     Shortness of breath or trouble breathing with:   Lying flat- no   Sitting at rest- no  Exertion- no    Patient with c/o 5/10 pain.    Objective:      Interventions this session      PT INTERVENTIONS: THEREX (88828) : NuStep to [facility] reason : active, assisted, or passive range of motion for improved joint mobility., THERACT (00349) : attending care plan meeting with res and interdisciplinary team to consult on res current status and anticipated POC, transfer training to increase functional task performance, facilitation of postural control, techniques to facilitate body scheme/awareness, and ROM techniques to increase functional task performance and to receive training and education on promoting improved sequencing and coordination techniques., and GAIT TX (17915) pre-gait : weight shift during mobility GAIT TX (05697) : training in correct hand/foot placement during gait, training in correct  sequencing of gait with AD to increase safety, weight shift during mobility, and with emphasis on increasing safety and performance within pts room GAIT TX reason : educate and train on effective sequencing and completion of ambulation around obstacles and in open environments , necessitates reeducation in ambulation after suffering musculoskeletal trauma, such as fractures or injuries., instruction on using mobility aids like walkers, crutches, or canes., and Instructing caregivers on appropriate guarding and assistive techniques to support patients during ambulation. for training patients whose walking abilities have been impaired by neurological, muscular, or skeletal abnormalities or trauma.        Range of Motion     LLE ROM WNL?  no PT achieved 95degrees PROM sitting for knee flexion.    Functional Ability    Assistive devices used with bed mobility:  Bed rail and Leg lifter     Rolling:  Independent     Supine - Sit:  Contact guard assist     Sit - Supine:  Contact guard assist     Sitting balance:  Normal     Sit - stand:  Contact guard assist     Stand balance:  Fair     Bed - Chair/ Chair - bed:  Contact guard assist     Comments:  Bed mob impacted greater this date by (L) LE pain hindering movement. Utilizing leg lifter to assist.      Ambulation     Ambulation surface:  Ambulation surfaces : Level     Ambulation  DistanceAmbulation Distance : :176ft, 32ft, 72ft     Ambulation ability:  Contact guard assist     Assistive devices:  Ambulation Assistive Device: Rolling walker     Ability to maintain weight bearing status:  Fair. Complains of pain limiting full weight bearing.     Ambulation tolerance:  Fair     Patient Fall Risk Reduction and Safety Strategies: Call bell in reach.      Assessment:   Pt is being seen 5x/wk in SNU for rehab following a hospital stay for (L) hip fracture repair resulting in overall mobility decline as needed for home mngt.  Pt to be participating in daily gait training,  transfer/mobility training, strengthening/ROM and endurance & balance training.  Pt is anticipated to progress toward POC goals. She is impacted at this time by pain to (L) LE that limits activity tolerance and effectiveness of PT interventions due to pt reaction to pain.   Plan:   Continue to follow according to established plan of care.    Therapist:   Ulis Rias, PT,  03/20/2022

## 2022-03-21 ENCOUNTER — Other Ambulatory Visit (HOSPITAL_BASED_OUTPATIENT_CLINIC_OR_DEPARTMENT_OTHER): Payer: Self-pay | Admitting: Specialist

## 2022-03-21 ENCOUNTER — Other Ambulatory Visit: Payer: Self-pay

## 2022-03-21 DIAGNOSIS — S72142A Displaced intertrochanteric fracture of left femur, initial encounter for closed fracture: Secondary | ICD-10-CM

## 2022-03-21 LAB — MAGNESIUM: MAGNESIUM: 2 mg/dL (ref 1.6–2.6)

## 2022-03-21 LAB — CBC WITH DIFF
BASOPHIL #: <0.04 10*3/uL (ref <=0.20)
BASOPHIL %: 0.2 %
EOSINOPHIL #: 0.08 10*3/uL (ref <=0.50)
EOSINOPHIL %: 0.9 %
HCT: 42.1 % (ref 34.8–46.0)
HGB: 12.9 g/dL (ref 11.5–16.0)
IMMATURE GRANULOCYTE #: <0.04 10*3/uL (ref <0.10)
IMMATURE GRANULOCYTE %: 0.1 % (ref 0.0–1.0)
LYMPHOCYTE #: 0.82 10*3/uL — ABNORMAL LOW (ref 1.00–4.80)
LYMPHOCYTE %: 9 %
MCH: 27.7 pg (ref 26.0–32.0)
MCHC: 30.6 g/dL — ABNORMAL LOW (ref 31.0–35.5)
MCV: 90.3 fL (ref 78.0–100.0)
MONOCYTE #: 1.06 10*3/uL (ref 0.20–1.10)
MONOCYTE %: 11.7 %
MPV: 9.9 fL (ref 8.7–12.5)
NEUTROPHIL #: 7.09 10*3/uL (ref 1.50–7.70)
NEUTROPHIL %: 78.1 %
PLATELETS: 333 10*3/uL (ref 150–400)
RBC: 4.66 10*6/uL (ref 3.85–5.22)
RDW-CV: 13.5 % (ref 11.5–15.5)
WBC: 9.1 10*3/uL (ref 3.7–11.0)

## 2022-03-21 LAB — BASIC METABOLIC PANEL
ANION GAP: 12 mmol/L (ref 4–13)
BUN/CREA RATIO: 22 (ref 6–22)
BUN: 14 mg/dL (ref 8–25)
CALCIUM: 9.5 mg/dL (ref 8.5–10.2)
CHLORIDE: 103 mmol/L (ref 96–111)
CO2 TOTAL: 28 mmol/L (ref 22–32)
CREATININE: 0.64 mg/dL (ref 0.49–1.10)
ESTIMATED GFR: >60 mL/min/{1.73_m2} (ref >60)
GLUCOSE: 90 mg/dL (ref 65–125)
POTASSIUM: 3.6 mmol/L (ref 3.5–5.1)
SODIUM: 143 mmol/L (ref 136–145)

## 2022-03-21 LAB — CLOSTRIDIUM DIFFICILE TOXIN DETECTION
C. DIFFICILE INTERPRETATION: UNDETERMINED — AB
C. DIFFICILE TOXIN GENE, PCR: POSITIVE — AB
C. difficile toxin: NEGATIVE

## 2022-03-21 NOTE — OT Treatment (Signed)
[facility]  Rehabilitation Services  Occupational Therapy Treatment Note    Patient Name: Sheri Garza  Date of Birth: 1935-04-06  Height:  149.9 cm ('4\' 11"'$ )  Weight:  45.4 kg (100 lb)  Room/Bed: 220/A  Payor: MEDICARE / Plan: MEDICARE PART A AND B / Product Type: Medicare /     Date/Time of Admission: 03/17/2022  4:27 PM  Admitting Diagnosis:  Aftercare for healing traumatic closed fracture of left hip [S72.002D]    The patient was seen this session for individual treatment minutes OT Individual Therapy Minutes: 45 and  0 co-treatment therapy minute as total billable minutes.  The following procedures were performed this session OT Treatment, ea 15 min ([id number]) and Therapeutic Exercise, ea 15 min ([id number]).      Subjective:     Patient concerned re: C.Diff+ and impact on upcoming eye appointment.       Objective:   ADL Retraining: Minutes- 25: Patient participated in ADL retraining to improve function and safety by means of simulated or direct practice with cues and physical assist as needed, active problem solving with identification of risks and opportunities, incorporation of adaptive techniques and compensatory strategies, and energy conservation/ work simplification skills. Patient participated in toileting with SBA and Mod ETE for ambulation to bathroom from recliner with FWW. Pt's BSC over toilet lowered to more comfortable height. Edu re: importance of hand washing with soap and warm water re: C.Diff with good understanding and technique demonstrated while in standing. Safety edu re: ADLs with pt report she has been requesting help and demo's good recall of yesterday's session.  Pt positioned for comfort in recliner chair with LLE propped on pillow.      Therapeutic Exercise: Minutes- 20: Patient participated in therapeutic exercise to increase Upper Body and Core Muscle strength and gross functional activity tolerance to improve their ability to perform I/B/MRADLs with  increased independence without fatigue, hence reducing risk of accidents, falls, and injuries related to fatigue and muscle weakness. Patient participated in yellow theraband and yellow gripper sponge with demonstration of good understanding of planes of use with hx of use and good tolerance x 10-15 reps.     *At end of session call bell and phone within reach, waffle cushion in place in chair, feet supported on the floor / heels off-loaded.   Shortness of breath or trouble breathing with exertion (e.g. Walking, bathing, transferring): n  Shortness of breath or trouble breathing when sitting at rest: n  Shortness of breath or trouble breathing when lying flat:  n    Assessment:   Patient demonstrates good tolerance for OT treatment this date with incorporation of moderate therapeutic rests to help ensure good physiologic response to treatment as necessary for safe and effective performance for progress in OT POC.  Patient with significant improvement in recall from prior visits, reduced tangential thoughts and conversation.     Plan:   Continue to follow according to established plan of care.    The risks/benefits of therapy have been discussed with the patient/caregiver and he/she is in agreement with the established plan of care.     Therapist:   Cathleen Fears, OT,  03/21/2022

## 2022-03-21 NOTE — PT Treatment (Signed)
[facility]  [place], [place] [address]  Rehabilitation Services  Physical Therapy Treatment Note    Patient Name: Sheri Garza  Date of Birth: 10-Oct-1935  Height:  149.9 cm ('4\' 11"'$ )  Weight:  45.4 kg (100 lb)  Room/Bed: 220/A  Payor: MEDICARE / Plan: MEDICARE PART A AND B / Product Type: Medicare /     Date/Time of Admission: 03/17/2022  4:27 PM  Admitting Diagnosis:  Aftercare for healing traumatic closed fracture of left hip [S72.002D]    The patient was seen this session for: PT Individual Therapy Minutes: 60 total individual billable minutes    The patient was seen this session for 60 total billable minutes.    The following procedures were performed this session: Therapeutic Exercise, ea 15 min ([id number]), Therapeutic Activity, ea 15 min ([id number]), and Gait Training, ea 15 min [id number])    Subjective:   Patient report: Pt reports ongoing (L) leg pain that hinders mobility. Pleasant and cooperative.     Shortness of breath or trouble breathing with:   Lying flat- no   Sitting at rest- no  Exertion- no    Patient with c/o 5/10 pain.    Objective:      Interventions this session      PT INTERVENTIONS: THEREX [id number]) : NuStep to [facility] reason : active, assisted, or passive range of motion for improved joint mobility., THERACT ([id number]) : attending care plan meeting with res and interdisciplinary team to consult on res current status and anticipated POC, transfer training to increase functional task performance, facilitation of postural control, techniques to facilitate body scheme/awareness, and ROM techniques to increase functional task performance and to receive training and education on promoting improved sequencing and coordination techniques., and GAIT TX (59563) pre-gait : weight shift during mobility GAIT TX (87564) : training in correct hand/foot placement during gait, training in correct sequencing of gait with AD to increase safety, weight shift during mobility, and with emphasis on  increasing safety and performance within pts room GAIT TX reason : educate and train on effective sequencing and completion of ambulation around obstacles and in open environments , necessitates reeducation in ambulation after suffering musculoskeletal trauma, such as fractures or injuries., instruction on using mobility aids like walkers, crutches, or canes., and Instructing caregivers on appropriate guarding and assistive techniques to support patients during ambulation. for training patients whose walking abilities have been impaired by neurological, muscular, or skeletal abnormalities or trauma.         Range of Motion     LLE ROM WNL?  no. PT achieved 100 degrees PROM sitting for knee flexion.     Functional Ability    Assistive devices used with bed mobility:  Bed rail and Leg lifter     Rolling:  Independent     Supine - Sit:  Contact guard assist     Sit - Supine:  Contact guard assist     Sitting balance:  Normal     Sit - stand:  Contact guard assist     Stand balance:  Fair     Bed - Chair/ Chair - bed:  Contact guard assist     Comments:  Bed mob impacted greater this date by (L) LE pain hindering movement. Utilizing leg lifter to assist.      Ambulation     Ambulation surface:  Ambulation surfaces : Level     Ambulation DistanceAmbulation Distance : :164f, 831f 5091f   Ambulation ability:  Contact  guard assist     Assistive devices:  Ambulation Assistive Device: Rolling walker     Ability to maintain weight bearing status:  Fair. Complains of pain limiting full weight bearing.     Ambulation tolerance:  Fair     Patient Fall Risk Reduction and Safety Strategies: Call bell in reach.      Assessment:   Pt is being seen 5x/wk in SNU for rehab following a hospital stay for (L) hip fracture repair resulting in overall mobility decline as needed for home mngt.  Pt to be participating in daily gait training, transfer/mobility training, strengthening/ROM and endurance & balance training.  Pt is anticipated  to progress toward POC goals. She is impacted at this time by pain to (L) LE that limits activity tolerance and effectiveness of PT interventions due to pt reaction to pain.   Plan:   Continue to follow according to established plan of care.  Ulis Rias, PT,  03/21/2022

## 2022-03-21 NOTE — Nurses Notes (Signed)
Dressings to left hip x3 changed today, no signs or symptoms of infection noted. Will continue to observe for any changes.     Garnette Gunner, RN

## 2022-03-21 NOTE — Nurses Notes (Signed)
Pt alert, oriented and able to make needs known to staff. Regular diet and takes medication whole without difficulty. Transfers with assist x1 and front wheel walker. Continues to participate and tolerate therapy services. Denies pain or discomfort. Denies SOB on RA. Contact II precautions maintained. Call light within reach.  Elta Guadeloupe, LPN

## 2022-03-21 NOTE — Nurses Notes (Signed)
Patient alert and oriented x 4. She makes needs known to staff. Her mood is pleasant and kind. She ambulates to bathroom with walker and one assist. Lab called during shift and stated we needed a larger stool specimen to do the GI test. We are still waiting on the pt to give [place] a large stool specimen. The stool specimen sent to lab, was called in by lab as a positive for C Diff. Please see lab results. Pt took her meds whole with water. She complained of no pain. We will continue to monitor and her call light is within reach.

## 2022-03-21 NOTE — Nurses Notes (Signed)
Brought this patient Vis's (Vaccine Information Statements) for RSV, Covid, Pneumonia and Flu.  Gave them to patient educated her on them and patient still refused the vaccines.

## 2022-03-22 ENCOUNTER — Other Ambulatory Visit: Payer: Self-pay

## 2022-03-22 DIAGNOSIS — S72142D Displaced intertrochanteric fracture of left femur, subsequent encounter for closed fracture with routine healing: Principal | ICD-10-CM

## 2022-03-22 DIAGNOSIS — Z48810 Encounter for surgical aftercare following surgery on the sense organs: Secondary | ICD-10-CM

## 2022-03-22 LAB — GI PANEL BY BIOFIRE FILM ARRAY
ADENOVIRUS F 40/41: NOT DETECTED
ASTROVIRUS: NOT DETECTED
CAMPYLOBACTER: NOT DETECTED
CRYPTOSPORIDIUM: NOT DETECTED
CYCLOSPORA CAYETANENSIS: NOT DETECTED
ENTAMOEBA HISTOLYTICA: NOT DETECTED
ENTEROAGGREGATIVE E. COLI (EAEC): NOT DETECTED
ENTEROPATHOGENIC E COLI (EPEC): NOT DETECTED
ENTEROTOXIGENIC E COLI (ETEC) LT/ST: NOT DETECTED
GIARDIA LAMBLIA: NOT DETECTED
NOROVIRUS GI/GII: DETECTED — AB
PLESIOMONAS SHIGELLOIDES: NOT DETECTED
ROTAVIRUS A: NOT DETECTED
SALMONELLA SPECIES: NOT DETECTED
SAPOVIRUS: NOT DETECTED
SHIGA-LIKE TOXIN-PRODUCING E COLI (STEC) STX1/STX2: NOT DETECTED
SHIGELLA/ENTEROINVASIVE E COLI (EIEC): NOT DETECTED
VIBRIO CHOLERAE: NOT DETECTED
VIBRIO: NOT DETECTED
YERSINIA ENTEROCOLITICA: DETECTED — AB

## 2022-03-22 MED ORDER — SULFAMETHOXAZOLE 400 MG-TRIMETHOPRIM 80 MG TABLET
1.0000 | ORAL_TABLET | Freq: Two times a day (BID) | ORAL | Status: DC
Start: 2022-03-22 — End: 2022-03-31
  Administered 2022-03-22: 0 mg via ORAL
  Administered 2022-03-23 – 2022-03-31 (×17): 80 mg via ORAL
  Filled 2022-03-22 (×16): qty 1

## 2022-03-22 NOTE — Progress Notes (Signed)
[facility] Hospitalist   Skilled Nursing Facility Note    Location: [facility]  Note type: Progress    PATIENT NAME: Sheri Garza  MRN: [id number]  DOB: [birth year]  Date of Service: 03/22/2022  CC: Left Lower Leg Swelling and Pain      SUBJECTIVE/HPI: pt seen and examined walking around her room. Reports concern about making it to her ophthalmology appointment on Monday.     Otherwise she reports tolerating therapy and feeling well.       Past Medical History:  Patient Active Problem List    Diagnosis Date Noted    Swelling of left lower extremity 03/19/2022    On deep vein thrombosis (DVT) prophylaxis 03/19/2022    Edema of left lower extremity 03/19/2022    Orthopedic aftercare 03/18/2022    At risk for malnutrition 03/18/2022    Left hip pain 03/18/2022    Acute blood loss as cause of postoperative anemia 03/17/2022    Atrophy of muscle of multiple sites 03/17/2022    Physical deconditioning 03/17/2022    Aftercare for healing traumatic closed fracture of left hip 03/03/2022    Back pain     DDD (degenerative disc disease), cervical     Insomnia     Kyphoscoliosis and scoliosis     Lumbar spinal stenosis     Osteoarthrosis     Avitaminosis D 09/14/2018    Thyroid nodule 04/01/2016    Spinal stenosis 04/01/2016    Hyperlipidemia 04/01/2016    Right Knee OA 09/21/2012       Past Surgical History:  Past Surgical History:   Procedure Laterality Date    HX CATARACT REMOVAL Left     HX CHOLECYSTECTOMY      HX COLONOSCOPY      HX DILATION AND CURETTAGE      HX OTHER      trigger finger, repair quad    HX TONSILLECTOMY      HX WISDOM TEETH EXTRACTION       Family History:  Family Medical History:       Problem Relation (Age of Onset)    Cancer Other, Father, Brother    Diabetes Other, Mother    HTN <20 y.o. Other    Heart Disease Mother          Social History:  Social History     Tobacco Use    Smoking status: Former    Smokeless tobacco: Never   Substance Use Topics    Alcohol use: No    Drug use: No        Medications:       aspirin (ECOTRIN) enteric coated tablet 81 mg 81 mg 2x/day    Carboxymethylcell-Glycerin(PF) 0.5-0.9 % Dropperette 1 Drop 1 Drop 5x/day    multivitamin (PRESERVISION AREDS 2) with minerals capsule 1 Capsule 2x/day-Food    [START ON 03/27/2022] tuberculin PPD 5 units/0.1 mL intradermal injection 0.1 mL Once      acetaminophen (TYLENOL) tablet, 650 mg, Q4H PRN    dextromethorphan-guaiFENesin (ROBITUSSIN DM) 10-'100mg'$  per 5m oral liquid, 10 mL, Q4H PRN    magnesium hydroxide (MILK OF MAGNESIA) '400mg'$  per 560moral liquid, 15 mL, 4x/day PRN    melatonin tablet, 3 mg, HS PRN - MR x 1    ondansetron (ZOFRAN ODT) rapid dissolve tablet, 4 mg, Q6H PRN    sennosides-docusate sodium (SENOKOT-S) 8.6-'50mg'$  per tablet, 1 Tablet, 2x/day PRN    Allergies:  Allergies   Allergen Reactions    Antihistamine [  Diphenhydramine Hcl] Swelling    Penicillins      Rash, Dizziness    Vancomycin  Other Adverse Reaction (Add comment)     Phlebitis    Flagyl [Metronidazole]     Gentamicin     Iodine And Iodide Containing Products     Lidocaine      Except xylocaine. Reynolds taking that    Other      Antihistamines and all caines, Pt states Xylocaine is ok    Tetracycline        Review of Systems:   Other than ROS in the HPI, all other systems were negative.      OBJECTIVE    Vitals:    03/20/22 1546 03/21/22 0500 03/21/22 1600 03/22/22 0538   BP: 122/60 113/64 (!) 105/55 121/65   Pulse: (!) 109 90 (!) 101 89   Resp: '16 18 16 16   '$ Temp: 36.7 C (98.1 F) 36.9 C (98.4 F) 36.7 C (98.1 F) 36.4 C (97.5 F)   SpO2: 97% 94% 94% 95%   Weight:       Height:       BMI:         PHYSICAL EXAMINATION:   Constitutional: Well developed, well nourished female. Mild distress due to LLE pain.   Integumentary: Warm, dry. Left medial ankle and area superior to medial ankle with edema and skin erythema. No significant warmth out of proportion to RLE.   HEENT: NCAT.    Cardiovascular: DP pulse LLE 2+. 2+ pitting edema LLE.   Respiratory: Normal  work of breathing. No accessory muscle use. On room air.   Neurological: AAO x 3. No focal deficits. Movement grossly present in all extremities. Sensation grossly intact in all extremities. Speech intact and appropriate.   Psychiatric: Normal mood and affect. Cooperative with exam. Good judgement and insight.       ASSESSMENT AND PLAN:    Sheri Garza is a 87 y.o. female seen today for  routine followup while at [place] [facility]. Feeling well. Tolerating therapy     Left Intertrochanteric fracture s/p ORIF (aftercare) / Left Lower Extremity Swelling (present on admission)  -Continue aggressive PT/OT  -Continue ASA for 21 days post op--patient has been complaint. End Date: 04/03/22.   -Continue PRN tylenol for pain along with PRN Ice/Heat.  WBAT  Post-op follow-up with Orthopedics at [facility] on 03/27/22     At Risk for protein calorie malnutrition  Consult nutrition / dietician with MNT protocol.   Monitor bowel functions.  Wt Readings from Last 3 Encounters:   03/17/22 45.4 kg (100 lb)   03/03/22 45.8 kg (100 lb 15.5 oz)   03/03/22 44.5 kg (98 lb)   Body mass index is 20.2 kg/m.  Lab Results   Component Value Date    ALBUMIN 2.5 (L) 03/08/2022      Macular Degeneration  Continue Preservision BID and eye drops for dry eyes as needed.     F/u with ophthalmology as scheduled         Berkley Harvey, MD  Assistant Professor  Section of [place]

## 2022-03-22 NOTE — Nurses Notes (Signed)
Resident is skilled for therapy, she has ben assisted to the bathroom with her walker and standby assist.  She has refused to use her incentive spirometer tonight as she has stated"I don't have time for it" She has been re-educated to use her call bell after on rounds she has been found at her sink.  She has been informed to call for assistance, by using her call bell that is at her bedside.

## 2022-03-22 NOTE — Nurses Notes (Signed)
Pt alert, oriented and able to make needs known to staff. Regular diet and takes medication whole without difficulty. Transfers with front wheel walker and assist x1. Denies pain or discomfort. Denies SOB on RA. Sitting in chair. Family at bedside. Call light within reach.  Elta Guadeloupe, LPN

## 2022-03-23 ENCOUNTER — Other Ambulatory Visit: Payer: Self-pay

## 2022-03-23 DIAGNOSIS — R197 Diarrhea, unspecified: Secondary | ICD-10-CM

## 2022-03-23 DIAGNOSIS — D62 Acute posthemorrhagic anemia: Secondary | ICD-10-CM

## 2022-03-23 DIAGNOSIS — S72002D Fracture of unspecified part of neck of left femur, subsequent encounter for closed fracture with routine healing: Secondary | ICD-10-CM

## 2022-03-23 NOTE — Nurses Notes (Signed)
Alert and oriented. Lung sounds clear. Bowel sounds present and active. Takes medications in applesauce. Bactrim po to start on 03/23/22 am. Is able to alert staff of any wants or needs. Contact 2 isolation for C-Diff maintained in private room. All services being provided in room. Skilled for therapy for orthopedic aftercare. Side rails up x 2. Call bell and PO fluids within reach.

## 2022-03-23 NOTE — Nurses Notes (Signed)
Pt alert, oriented and able to make needs known to staff. Regular diet and takes medication whole without difficulty. Started po Bactrim for Norovirus, denies adverse reactions at this time. Transfers with front wheel walker and assist x1. Denies pain or discomfort. Denies SOB on RA. Sitting in chair. Call light within reach.  Elta Guadeloupe, LPN

## 2022-03-23 NOTE — Progress Notes (Signed)
[facility]    Progress Note    Sheri Garza  Date of service: 03/23/2022  Date of Admission:  03/17/2022  Hospital Day:  LOS: 6 days     PT/OT: Yes  Consults: No new consults    Chief Complaint: No chief complaint on file.      Subjective:    Pt has been having diarrhea. Stool sample sent for C-Diff which came back indeterminate. Bio fire was done on 03/22/22 and results show Noroviurus and Yersinia Enterocolitica bacteria. With Norovirus detected pt encouraged to stay hydrated. Will have to reschedule Ophthalmology appointment.     Review of Systems   General: Denies Appetite Loss, Chills, Dietary Changes, Fatigue, Fever, or Night Sweats.  Skin: Denies New Rash or Lesions  Neck: Denies Neck lump/mass or Stiffness.  Respiratory: Denies Cough, Decreased exercise tolerance from recent baseline ability, Dyspnea at rest or Hemoptysis.  Cardiovascular: Denies Chest Pain, Irregular Heart Beats, Orthopnea or increase in swelling of Extremities.  Gastrointestinal: Intermittent Abdominal Pain, No Black / Tarry or Bloody Stool. Pt is experiencing diarrhea with intermittent episodes of mucus.   Genitourinary: Denies Blood in Urine, Change In Bladder Habits or Stream.  Musculoskeletal: Denies Joint Redness, Joint Swelling or Muscle Weakness.  Neurological: Denies Changes in Speech / Vision / Hearing, or other Focal Neurological Symptoms.  Hematology: Denies Abnormal Bleeding or Bruising.    Physical Examination:      Vital Signs:  Filed Vitals:    03/21/22 1600 03/22/22 0538 03/22/22 1500 03/23/22 0500   BP: (!) 105/55 121/65 105/60 (!) 141/67   Pulse: (!) 101 89 96 85   Resp: '16 16 17 16   '$ Temp: 36.7 C (98.1 F) 36.4 C (97.5 F) 36.8 C (98.2 F) 36.6 C (97.9 F)   SpO2: 94% 95% 95% 96%       Physical Exam:  Constitutional: Patient is in no acute distress. She is very concerned about making an ophthalmology appointment on Monday.  HEENT: Head normocephalic and atraumatic.   Eyes: PERRL, conjunctiva is  without erythema or discharge.   Neck: Supple. Soft, FROM  Lungs: Clear to auscultation bilaterally without wheezes, rales or rhonchi.  Cardiovascular: Regular rate and rhythm. No murmur, rub, or gallop. Pulses strong and equal bilaterally.  Abdomen: Normal bowel sounds. Soft, nontender, nondistended. No rebound, Guarding, or masses noted.   MSK/Extremities: Moves all extremities.     Skin: Warm, dry and intact. No rashes are noted.  Neuro: No changes in speech, vision or hearing. No tremors noted.  Psych: Alert and oriented to person, place, and time. Normal affect.      Medications     acetaminophen (TYLENOL) tablet, 650 mg, Oral, Q4H PRN  aspirin (ECOTRIN) enteric coated tablet 81 mg, 81 mg, Oral, 2x/day  Carboxymethylcell-Glycerin(PF) 0.5-0.9 % Dropperette 1 Drop, 1 Drop, Ophthalmic, 5x/day  dextromethorphan-guaiFENesin (ROBITUSSIN DM) 10-'100mg'$  per 64m oral liquid, 10 mL, Oral, Q4H PRN  magnesium hydroxide (MILK OF MAGNESIA) '400mg'$  per 53moral liquid, 15 mL, Oral, 4x/day PRN  melatonin tablet, 3 mg, Oral, HS PRN - MR x 1  multivitamin (PRESERVISION AREDS 2) with minerals capsule, 1 Capsule, Oral, 2x/day-Food  ondansetron (ZOFRAN ODT) rapid dissolve tablet, 4 mg, Oral, Q6H PRN  sennosides-docusate sodium (SENOKOT-S) 8.6-'50mg'$  per tablet, 1 Tablet, Oral, 2x/day PRN  trimethoprim-sulfamethoxazole (BACTRIM) 80-'400mg'$  per tablet, 1 Tablet, Oral, 2x/day  [START ON 03/27/2022] tuberculin PPD 5 units/0.1 mL intradermal injection, 0.1 mL, Intradermal, Once        Labs, Microbiology/Pathology  Blood Sugars:   No results for input(s): "GLUIP" in the last 48 hours.    Labs:  I have reviewed all lab results.  Lab Results Today:    Results for orders placed or performed during the hospital encounter of 03/17/22 (from the past 24 hour(s))   GI PANEL BY BIOFIRE FILM ARRAY    Specimen: Stool   Result Value Ref Range    ADENOVIRUS F 40/41 Target Not Detected Target Not Detected    ASTROVIRUS Target Not Detected Target Not  Detected    CYCLOSPORA CAYETANENSIS Target Not Detected Target Not Detected    CAMPYLOBACTER Target Not Detected Target Not Detected    CRYPTOSPORIDIUM Target Not Detected Target Not Detected    ENTAMOEBA HISTOLYTICA Target Not Detected Target Not Detected    ENTEROAGGREGATIVE E. COLI (EAEC) Target Not Detected Target Not Detected    ENTEROPATHOGENIC E COLI (EPEC) Target Not Detected Not Detected    ENTEROTOXIGENIC E COLI (ETEC) LT/ST Target Not Detected Target Not Detected    GIARDIA LAMBLIA Target Not Detected Target Not Detected    NOROVIRUS GI/GII Detected (A) Target Not Detected    PLESIOMONAS SHIGELLOIDES Target Not Detected Target Not Detected    ROTAVIRUS A Target Not Detected Target Not Detected    SALMONELLA SPECIES Target Not Detected Target Not Detected    SAPOVIRUS Target Not Detected Target Not Detected    SHIGELLA/ENTEROINVASIVE E COLI (EIEC) Target Not Detected Target Not Detected    SHIGA-LIKE TOXIN-PRODUCING E COLI (STEC) STX1/STX2 Target Not Detected Target Not Detected    VIBRIO CHOLERAE Target Not Detected Target Not Detected    VIBRIO Target Not Detected Target Not Detected    YERSINIA ENTEROCOLITICA Detected (A) Target Not Detected       Microbiology/Pathology:    No results found for this visit on 03/17/22.  Hospital Encounter on 03/17/22 (from the past 96 hour(s))   CLOSTRIDIUM DIFFICILE TOXIN DETECTION    Collection Time: 03/20/22  6:03 PM    Specimen: Stool; Other   Culture Result Status    C. DIFFICILE INTERPRETATION Indeterminate (A) Final    C. DIFFICILE TOXIN GENE, PCR Positive (A) Final    C. difficile toxin Negative Final    Narrative    Presence of C. difficile detected by PCR without evidence of toxin production.  Findings may represent toxin level below assay limit of detection or colonization by a toxigenic strain.  Alternative etiologies of diarrhea and thorough clinical assessment should be considered before ascribing symptoms to C. difficile infection.    Both INDETERMINATE  and POSITIVE results require CONTACT II precautions.     This test algorithm incorporates two FDA-cleared assays: (1) toxin gene PCR using Cepheid GeneXpert C. difficile assay, and (2) immunoassay for toxins A/B using Tech[employer] Quik Chek Complete.     GI PANEL BY BIOFIRE FILM ARRAY    Collection Time: 03/22/22  9:19 AM    Specimen: Stool   Culture Result Status    ADENOVIRUS F 40/41 Target Not Detected Final    ASTROVIRUS Target Not Detected Final    CYCLOSPORA CAYETANENSIS Target Not Detected Final    CAMPYLOBACTER Target Not Detected Final    CRYPTOSPORIDIUM Target Not Detected Final    ENTAMOEBA HISTOLYTICA Target Not Detected Final    ENTEROAGGREGATIVE E. COLI (EAEC) Target Not Detected Final    ENTEROPATHOGENIC E COLI (EPEC) Target Not Detected Final    ENTEROTOXIGENIC E COLI (ETEC) LT/ST Target Not Detected Final    GIARDIA LAMBLIA Target Not Detected  Final    NOROVIRUS GI/GII Detected (A) Final    PLESIOMONAS SHIGELLOIDES Target Not Detected Final    ROTAVIRUS A Target Not Detected Final    SALMONELLA SPECIES Target Not Detected Final    SAPOVIRUS Target Not Detected Final    SHIGELLA/ENTEROINVASIVE E COLI (EIEC) Target Not Detected Final    SHIGA-LIKE TOXIN-PRODUCING E COLI (STEC) STX1/STX2 Target Not Detected Final    VIBRIO CHOLERAE Target Not Detected Final    VIBRIO Target Not Detected Final    YERSINIA ENTEROCOLITICA Detected (A) Final    Narrative    The results of this test should not be used as the sole basis for diagnosis, treatment, or other management decisions.    Test developed and performance on this sample type validated at [facility] using an FDA cleared assay.  Performed by [employer] using the Biofire Gastrointestinal Panel.       Imaging:     Imaging:         Assessment/Plan     Active Hospital Problems    Diagnosis    Primary Problem: Orthopedic aftercare    Swelling of left lower extremity    On deep vein thrombosis (DVT) prophylaxis    Edema of left lower extremity    At risk for malnutrition     Left hip pain    Acute blood loss as cause of postoperative anemia    Atrophy of muscle of multiple sites    Physical deconditioning    Aftercare for healing traumatic closed fracture of left hip       Norovirus  C-Diff Indeterminate  Yersinia Enterocolitica Bacteria  - Pt starting Bactrim BID for 14 days  - Remains in contact isolation at this time  - Ophthalmology appointment needs rescheduled    Left Intertrochanteric fracture s/p ORIF (aftercare)  Continue aggressive Pt/OT  Continue ASA for 21 days post op  Continue tylenol for pain  Weight bearing as tolerated     At Risk for malnutrition  Consult nutrition / dietician   Consider calorie count as needed     Macular Degeneration  Continue preservision for dry eyes as needed    DVT/PE Prophylaxis: Refused VTE prophylaxis    Disposition Planning:   Home possibly with home health.    Parts of this note were generated using MModal dictation. There may be some incorrect words, spellings, and punctuation that were not noted when proofreading the note before signing.     Elgie Congo, APRN,ANP-BC3/10/2022     I have discussed the case with the NP.   I reviewed the nurse practitioner note.  I agree with the findings and plan of care as documented in the nurse practitioner note.  Any exceptions/additions are edited/noted.    Everardo All, MD 03/24/2022, 10:13

## 2022-03-24 ENCOUNTER — Other Ambulatory Visit: Payer: Self-pay

## 2022-03-24 NOTE — PT Treatment (Signed)
[facility]  [place], [place] [address]  Rehabilitation Services  Physical Therapy Treatment Note    Patient Name: Sheri Garza  Date of Birth: 29-Nov-1935  Height:  149.9 cm ('4\' 11"'$ )  Weight:  45.4 kg (100 lb)  Room/Bed: 220/A  Payor: MEDICARE / Plan: MEDICARE PART A AND B / Product Type: Medicare /     Date/Time of Admission: 03/17/2022  4:27 PM  Admitting Diagnosis:  Aftercare for healing traumatic closed fracture of left hip [S72.002D]    The patient was seen this session for: PT Individual Therapy Minutes: 60 total individual billable minutes    The patient was seen this session for 60 total billable minutes.    The following procedures were performed this session: Therapeutic Exercise, ea 15 min ([id number]), Therapeutic Activity, ea 15 min ([id number]), and Gait Training, ea 15 min [id number])    Subjective:   Patient report: Pt alert and pleasant this AM. She states she if feeling ok and expresses that she doesn't feel like she has anything wrong with her (regarding C-diff and Norovirus). She notes that she continues to need her "leg to get better so I can go home." Res to be seen in-room due to contact isolation precautions from norovirus.    Shortness of breath or trouble breathing with:   Lying flat- no   Sitting at rest- no  Exertion- no    Patient with c/o 5/10 pain.    Objective:      Interventions this session     PT INTERVENTIONS: THEREX [id number]) : PROM/AAROM to LE's in all joint planes, open kinetic chain exercises, closed kinetic chain exercises, seated hip abduction/adduction, seated LAQ, seated marching, ankle pumps, toe raises, and heel raises to [facility] reason : active, assisted, or passive range of motion for improved joint mobility. and resistance exercises that build muscle strength and endurance. THERACT ([id number]) : attending care plan meeting with res and interdisciplinary team to consult on res current status and anticipated POC, transfer training to increase functional task  performance, facilitation of postural control, techniques to facilitate body scheme/awareness, and ROM techniques to increase functional task performance and to receive training and education on promoting improved sequencing and coordination techniques., and GAIT TX ([id number]) pre-gait : weight shift during mobility GAIT TX ([id number]) : training in correct hand/foot placement during gait, training in correct sequencing of gait with AD to increase safety, weight shift during mobility, and with emphasis on increasing safety and performance within pts room GAIT TX reason : educate and train on effective sequencing and completion of ambulation around obstacles and in open environments , necessitates reeducation in ambulation after suffering musculoskeletal trauma, such as fractures or injuries., instruction on using mobility aids like walkers, crutches, or canes., and Instructing caregivers on appropriate guarding and assistive techniques to support patients during ambulation. for training patients whose walking abilities have been impaired by neurological, muscular, or skeletal abnormalities or trauma.      Functional Ability     Sitting balance:  Normal     Sit - stand:  Contact guard assist     Stand balance:  Fair     Bed - Chair/ Chair - bed:  Contact guard assist    Ambulation     Ambulation surface:  Ambulation surfaces : Level     Ambulation DistanceAmbulation Distance : :     Ambulation ability:  Contact guard assist     Assistive devices:  Ambulation Assistive Device: Rolling walker  Ability to maintain weight bearing status:  Fair     Ambulation tolerance:  Fair     Comments: Continues to express concern for placing weight onto (L) LE.   Patient Fall Risk Reduction and Safety Strategies: Call bell in reach.     Assessment:   Pt is being seen 5x/wk in SNU for rehab following a hospital stay for (L) hip fracture repair resulting in overall mobility decline as needed for home mngt.  Pt to be participating in daily  gait training, transfer/mobility training, strengthening/ROM and endurance & balance training.  Pt is anticipated to progress toward POC goals. She is impacted at this time by pain to (L) LE that limits activity tolerance and effectiveness of PT interventions due to pt reaction to pain.    Plan:   Continue to follow according to established plan of care.    Therapist:   Ulis Rias, PT,  03/24/2022

## 2022-03-24 NOTE — OT Treatment (Signed)
[facility]  Rehabilitation Services  Occupational Therapy Treatment Note    Patient Name: Sheri Garza  Date of Birth: Sep 04, 1935  Height:  149.9 cm ('4\' 11"'$ )  Weight:  45.4 kg (100 lb)  Room/Bed: 220/A  Payor: MEDICARE / Plan: MEDICARE PART A AND B / Product Type: Medicare /     Date/Time of Admission: 03/17/2022  4:27 PM  Admitting Diagnosis:  Aftercare for healing traumatic closed fracture of left hip [S72.002D]    The patient was seen this session for OT Individual Therapy Minutes: 25 total billable minutes.  The following procedures were performed this session Therapeutic Exercise, ea 15 min ([id number]).      Subjective:     Patient with c/o LLE pain, however did not formally rate. RN aware. Pt states "I know I can do it, but I still need to do my therapy to get better!"       Objective:   Pt supine in bed upon OT arrival, visitor present. Pt agreeable to in bed tx this date.   Therapeutic Exercise [phone]): OT facilitated pt participation in UE strengthening and ROM exercises to improve independence and performance in ADLs/IADLs. Pt completed bicep curls, chest press, shoulder press, and posterior and anterior BUE's rolls with 2# DR for 20 reps for each exercise. Pt then completed elbow extension, shoulder abduction/adduction with yellow theraband for 20 reps for each exercise. Pt provided cues for pacing and proper form for max benefits of UE exercises. Pt provided with education on importance of UE exercise to improve independence and performance with ADLs, functional transfers, endurance, and functional activity tolerance.   At end of session call bell and phone within reach, heels off-loaded, with pt resting comfortably in bed.   Shortness of breath or trouble breathing with exertion (e.g. Walking, bathing, transferring): no  Shortness of breath or trouble breathing when sitting at rest: no  Shortness of breath or trouble breathing when lying flat:  no    Assessment:   Patient  demonstrates fair tolerance for OT treatment this date with incorporation of moderate education and cues to help ensure good carryover and increased independence. Pt limited in OOB ADL activity this date secondary to reports of fatigue, and LLE pain.     Plan:   Continue to follow according to established plan of care.    The risks/benefits of therapy have been discussed with the patient/caregiver and he/she is in agreement with the established plan of care.     Therapist:   Shanon Brow, OT,  03/24/2022

## 2022-03-24 NOTE — Nurses Notes (Signed)
Patient is alert and oriented. Lung sounds clear. Bowel sounds present and active. Takes medications in applesauce. Dressings intact to left hip without redness, edema or drainage. Is able to alert staff for any wants or need. Contact 2 isolation for C-DIFF maintained in private room. All services being provided in room. Skilled for therapy for orthopedic aftercare. Side rails up x 2. Call bell land PO fluids within reach.

## 2022-03-24 NOTE — Nurses Notes (Signed)
Alert and oriented. Patient skilled for therapy. Resident is in contact isolation 2 for Norovirus, Yessinia enterocolitica, C Diff, in private room, all services being provided in room. Patient takes meds whole in water and a one in apple sauce. Patient was up in chair this shift. Call light within reach.       Lutricia Feil, LPN

## 2022-03-24 NOTE — Nurses Notes (Signed)
Resident's baseline temperature for reference of coding J1550A of the MDS calculated as 98.1 F.

## 2022-03-25 ENCOUNTER — Other Ambulatory Visit: Payer: Self-pay

## 2022-03-25 NOTE — OT Treatment (Signed)
[facility]  Rehabilitation Services  Occupational Therapy Treatment Note    Patient Name: Sheri Garza  Date of Birth: 26-Aug-1935  Height:  149.9 cm ('4\' 11"'$ )  Weight:  45.4 kg (100 lb)  Room/Bed: 220/A  Payor: MEDICARE / Plan: MEDICARE PART A AND B / Product Type: Medicare /     Date/Time of Admission: 03/17/2022  4:27 PM  Admitting Diagnosis:  Aftercare for healing traumatic closed fracture of left hip [S72.002D]    The patient was seen this session for OT Individual Therapy Minutes: 42 total billable minutes.  The following procedures were performed this session OT Treatment, ea 15 min ([id number]).      Subjective:     Patient with c/o 7/10 LLE pain with activity, however pain subsides during rest. Nursing aware.       Objective:   Pt supine in bed upon OT arrival to room. Pt agreeable to participate in OT session, stating "I need to get up and get ready so that I am not late for my appointment!"  OT [place] ([id number]): Pt completed functional bed mobility from supine > sit EOB with CGA for safety. Pt then completed STS transfers with CGA and FWW, and functional ambulation into r/r for toileting routine. Pt completed toilet transfers with CGA and grab bar, and was able to complete toileting routine with SBA including perineal hygiene and clothing management. Pt continent of urine and BM on toilet. Pt then transferred to chair in bathroom to complete remainder of ADL routine. Pt completed seated UB dressing with SUA, seated/standing LB dressing with Min A to don pants over B feet, and LB dressing to doff/don socks with dependence secondary to decreased ability to reach towards ground. Pt then completed standing grooming tasks at sink including oral care, and hand hygiene with FWW and SBA to ensure balance and safety. Lastly, pt completed functional ambulation back into room with FWW and CGA, stand>sit transfer with CGA to sit EOB, and completed functional bed mobility to go from EOB > supine  with Min A for BLE management. Pt required min verbal cues t/o session to improve independence, and safety during ADL routine. Pt demonstrated good understanding.   At end of session call bell and phone within reach, heels off-loaded, with pt resting comfortably supine in bed.   Shortness of breath or trouble breathing with exertion (e.g. Walking, bathing, transferring): no  Shortness of breath or trouble breathing when sitting at rest: no  Shortness of breath or trouble breathing when lying flat:  no    Assessment:   Pt is demonstrating increased progress towards goals in POC including independence in ADL routine, functional activity tolerance, endurance, and functional transfers. Pt required min rest breaks t/o session secondary to fatigue, however was able to complete all aspects of ADL routine.     Plan:   Continue to follow according to established plan of care.    The risks/benefits of therapy have been discussed with the patient/caregiver and he/she is in agreement with the established plan of care.     Therapist:   Shanon Brow, OT,  03/25/2022

## 2022-03-25 NOTE — PT Treatment (Signed)
[facility]  [place], [place] [address]  Rehabilitation Services  Physical Therapy Treatment Note    Patient Name: Sheri Garza  Date of Birth: 04/13/1935  Height:  149.9 cm ('4\' 11"'$ )  Weight:  45.4 kg (100 lb)  Room/Bed: 220/A  Payor: MEDICARE / Plan: MEDICARE PART A AND B / Product Type: Medicare /     Date/Time of Admission: 03/17/2022  4:27 PM  Admitting Diagnosis:  Aftercare for healing traumatic closed fracture of left hip [S72.002D]    The patient was seen this session for: PT Individual Therapy Minutes: 55 total individual billable minutes    The patient was seen this session for 55 total billable minutes.    The following procedures were performed this session: Therapeutic Exercise, ea 15 min ([id number]), Therapeutic Activity, ea 15 min ([id number]), and Gait Training, ea 15 min [id number])    Subjective:   Patient report: Pt hesitant and reluctant to participate with PT due to concerns of having to go to appointment. She continues to be in-room for treatments due to norovirus and having C-Diff.     Shortness of breath or trouble breathing with:   Lying flat- no   Sitting at rest- no  Exertion- no    Patient with c/o 5/10 pain.    Objective:      Interventions this session      PT INTERVENTIONS: THEREX [id number]) : PROM/AAROM to LE's in all joint planes, open kinetic chain exercises, closed kinetic chain exercises, seated hip abduction/adduction, seated LAQ, seated marching, ankle pumps, toe raises, and heel raises to [facility] reason : active, assisted, or passive range of motion for improved joint mobility. and resistance exercises that build muscle strength and endurance. THERACT ([id number]) : attending care plan meeting with res and interdisciplinary team to consult on res current status and anticipated POC, transfer training to increase functional task performance, facilitation of postural control, techniques to facilitate body scheme/awareness, and ROM techniques to increase functional task  performance and to receive training and education on promoting improved sequencing and coordination techniques., and GAIT [place] (44010) pre-gait : weight shift during mobility GAIT [place] (27253) : training in correct hand/foot placement during gait, training in correct sequencing of gait with AD to increase safety, weight shift during mobility, and with emphasis on increasing safety and performance within pts room GAIT [place] reason : educate and train on effective sequencing and completion of ambulation around obstacles and in open environments , necessitates reeducation in ambulation after suffering musculoskeletal trauma, such as fractures or injuries., instruction on using mobility aids like walkers, crutches, or canes., and Instructing caregivers on appropriate guarding and assistive techniques to support patients during ambulation. for training patients whose walking abilities have been impaired by neurological, muscular, or skeletal abnormalities or trauma.       Functional Ability     Sitting balance:  Normal     Sit - stand:  Contact guard assist     Stand balance:  Fair     Bed - Chair/ Chair - bed:  Contact guard assist     Ambulation     Ambulation surface:  Ambulation surfaces : Level     Ambulation DistanceAmbulation Distance : in-room multiple laps.      Ambulation ability:  Contact guard assist     Assistive devices:  Ambulation Assistive Device: Rolling walker     Ability to maintain weight bearing status:  Fair     Ambulation tolerance:  Fair     Comments:  Continues to express concern for placing weight onto (L) LE.   Patient Fall Risk Reduction and Safety Strategies: Call bell in reach.      Assessment:   Pt is being seen 5x/wk in SNU for rehab following a hospital stay for (L) hip fracture repair resulting in overall mobility decline as needed for home mngt.  Pt to be participating in daily gait training, transfer/mobility training, strengthening/ROM and endurance & balance training.  Pt is anticipated to  progress toward POC goals. She is impacted at this time by pain to (L) LE that limits activity tolerance and effectiveness of PT interventions due to pt reaction to pain.    Plan:   Continue to follow according to established plan of care.    Therapist:   Ulis Rias, PT,  03/25/2022

## 2022-03-25 NOTE — Care Plan (Signed)
[facility]  Medical Nutrition Therapy Follow Up    SUBJECTIVE:   Spoke with pt at bedside. Pt reported her appetite to be poor since SNU admission and that although she is trying to eat, she doesn't feel hungry. She attributed her lack of appetite it to the food she is receiving and overall not feeling well. Additionally pt reported not getting a lot of sleep the past 2 days which she thinks is also attributing to not feeling well.  Pt finishing her lunch at time of visit and was drinking her ensure. Pt stated liking the ensure and wants to continue receiving them. Pt declined adding ensure supplements at other meal times at this time as she stated the supplement is "pretty filling." Encouraged pt to drink between meal times or in the evenings to optimize PO intake. Pt did not have any further requests for likes and dislikes. Encouraged pt try to get some rest and to eat when possible. Informed pt that family/loved ones can bring in food for her if possible to potentially aid with her PO intake. Encouraged pt to reach out if she had any nutrition questions or concerns.      OBJECTIVE: Medications: acetaminophen, aspirin, carboxymethylcell-glycerin, Dextromethorphan-guaifenesin, magnesium hydroxide, melatonin, multivitamin with minerals, ondansetron, sennosides-docusate, tuberculin PPD 5 units     Current Diet Order/Nutrition Support:  DIET REGULAR  MNT PROTOCOL FOR DIETITIAN  DIETARY ORAL SUPPLEMENTS Oral Supplements with tray: Ensure Plus High Protein- Vanilla; LUNCH; 1 Each     Height Used for Calculations: 149.9 cm (4' 11.02")  Weight Used For Calculations: 45.4 kg (100 lb 1.4 oz)  BMI (kg/m2): 20.25     Ideal Body Weight (IBW) (kg): 43.61  % Ideal Body Weight: 104.1              DAILY WEIGHTS FOR THIS ADMISSION  Weights(Reverse Chron) (last 90 days)    Date/Time Weight Weight Source Weight Source (NICU) % weight change Who   03/17/22 1651 45.4 kg (100 lb) Bed  -- -1 % MS       Re-assessed needs if applicable:    Energy Calorie Requirements: 1350-1600kcal (30-35kcal/45.4 kg BW) per day   Protein Requirements (gms/day): 52-65g (1.2-1.5g/43.61 kg IBW) per day   Fluid Requirements: minimum 1546m DRI per day    Comments:   NAylanie Garinois a 87y.o. female; PMH osteoporosis, HLD. [facility] acute rehab stint secondary to a fall at home that resulted in left intertrochanteric fracture requiring ORIF on 03/04/2022.     Pt documented intake from the past 7 days: range from 0-100% with average intake of 70%    Plan/Interventions :   1) Continue regular diet  - please encourage intake  2) Continue Ensure plus HP (vanilla) per pt request 1x/day  - please encourage intake between meals of in the evening time to optimize PO intake  3) Continue multivitamin with minerals for micronutrient support  4) Monitor BM's for regularity    5) Continue weekly weights   - recommend to obtain new weight when able  6) Will monitor labs/wts/PO intake     Dietitian available prn       Nutrition Diagnosis: Underweight related to  suspected inadequate protein energy intake prior to admission  as evidenced by  BMI < 23.5 (65+) (ongoing)      ECephus Shelling RD  03/25/2022, 15:21  Phone [phone]

## 2022-03-25 NOTE — Nurses Notes (Signed)
Pt transported via transport chair to private vehicle for appointment.  Elta Guadeloupe, LPN

## 2022-03-25 NOTE — Care Plan (Signed)
[place]  [place]  Speech Therapy Progress Note    Patient Name: Sheri Garza  Date of Birth: 02-01-1935  Weight:  45.4 kg (100 lb)  Room/Bed: 220/A  Payor: MEDICARE / Plan: MEDICARE PART A AND B / Product Type: Medicare /      Date/Time of Admission: 03/17/2022  4:27 PM  Admitting Diagnosis:  Aftercare for healing traumatic closed fracture of left hip [S72.002D]        Assessment:  Mild-mod cognitive communication deficits    Plan:  Recommendations: ST to continue POC   Results & Recommendations Discussed With:Patient, Nurse, and MD   Continue to follow patient according to established plan of care.  The risks/benefits of therapy have been discussed with the patient/caregiver and he/she is in agreement with the established plan of care.       Subjective:  Flowsheet Info:     03/25/22 1739   Therapist Pager   SLP Pager Debe Coder [phone]   Rehab Session   Document Type therapy progress note (daily note)   Total SLP Minutes: 26   Patient Effort good   Symptoms Noted During/After Treatment none   General Information   Patient Profile Reviewed yes     Pt was awake, alert, upright in bed and breathing on room air comfortably      Objective:  Pt was instructed in cognitive linguistic task targeting working memory to improve ability to recall functional information to maximize safety and independence. SLP provided teaching and training with memory strategies (association, repetition, visualization, writing down information, chunking). Pt with good response to education and was able to teach back recall strategies in given situations with 70-75% accuracy. Pt was able to recall functional information and daily events without cues. HEP provided to Pt with memory strategies and application examples to maximize carryover. Pt was left in good condition with call light and bedside table within reach.      Therapist:  Sandrea Hammond, ST   Pager #: [phone]  Phone #: [phone]  Treatment Time:  26 minutes

## 2022-03-25 NOTE — Nurses Notes (Signed)
Pt alert, oriented and able to make needs known to staff. Regular diet and takes medication whole without difficulty. Transfers with front wheel walker and assist x1. TTWB LLE. Denies pain or discomfort this shift. Denies SOB on RA. Contact II precautions maintained. Denies adverse reaction to po Bactrim. LBM 03/24/22. Eye appointment today, family friend transported pt. Call light within reach.  Elta Guadeloupe, LPN

## 2022-03-26 ENCOUNTER — Other Ambulatory Visit: Payer: Self-pay

## 2022-03-26 NOTE — PT Treatment (Signed)
[facility]  [place], [place] [address]  Rehabilitation Services  Physical Therapy Treatment Note    Patient Name: Sheri Garza  Date of Birth: 09-27-1935  Height:  149.9 cm ('4\' 11"'$ )  Weight:  45.4 kg (100 lb)  Room/Bed: 220/A  Payor: MEDICARE / Plan: MEDICARE PART A AND B / Product Type: Medicare /     Date/Time of Admission: 03/17/2022  4:27 PM  Admitting Diagnosis:  Aftercare for healing traumatic closed fracture of left hip [S72.002D]    The patient was seen this session for: PT Individual Therapy Minutes: 55 total individual billable minutes    The patient was seen this session for 55 total billable minutes.    The following procedures were performed this session: Therapeutic Exercise, ea 15 min ([id number]), Therapeutic Activity, ea 15 min ([id number]), and Gait Training, ea 15 min [id number])    Subjective:   Patient report: Pt expressing ongoing concerns for her (L) LE with the pain and edema, for which nursing is aware.     Shortness of breath or trouble breathing with:    Lying flat- no              Sitting at rest- no  Exertion- no     Patient with c/o 5/10 pain.     Objective:      Interventions this session      PT INTERVENTIONS: THEREX [id number]) : PROM/AAROM to LE's in all joint planes, open kinetic chain exercises, closed kinetic chain exercises: supine heel slides with overpressure to (L) knee to facilitate increased knee flexion, quad sets and SLR; seated hip abduction/adduction, seated LAQ, seated marching, ankle pumps, toe raises, and heel raises to [facility] reason : active, assisted, or passive range of motion for improved joint mobility. and resistance exercises that build muscle strength and endurance. THERACT ([id number]) : attending care plan meeting with res and interdisciplinary team to consult on res current status and anticipated POC, transfer training to increase functional task performance, facilitation of postural control, techniques to facilitate body scheme/awareness, and ROM  techniques to increase functional task performance and to receive training and education on promoting improved sequencing and coordination techniques., and GAIT [place] (54270) pre-gait : weight shift during mobility GAIT [place] (62376) : training in correct hand/foot placement during gait, training in correct sequencing of gait with AD to increase safety, weight shift during mobility, and with emphasis on increasing safety and performance within pts room GAIT TX reason : educate and train on effective sequencing and completion of ambulation around obstacles and in open environments , necessitates reeducation in ambulation after suffering musculoskeletal trauma, such as fractures or injuries., instruction on using mobility aids like walkers, crutches, or canes., and Instructing caregivers on appropriate guarding and assistive techniques to support patients during ambulation. for training patients whose walking abilities have been impaired by neurological, muscular, or skeletal abnormalities or trauma.       Functional Ability     Sitting balance:  Normal     Sit - stand:  SBA     Stand balance:  Fair     Bed - Chair/ Chair - bed:  Contact guard assist     Ambulation     Ambulation surface:  Ambulation surfaces : Level     Ambulation DistanceAmbulation Distance : in-room multiple laps.      Ambulation ability:  Contact guard assist     Assistive devices:  Ambulation Assistive Device: Rolling walker     Ability to maintain weight  bearing status:  Fair     Ambulation tolerance:  Fair     Comments: Continues to express concern for placing weight onto (L) LE.   Patient Fall Risk Reduction and Safety Strategies: Call bell in reach.      Assessment:   Pt is being seen 5x/wk in SNU for rehab following a hospital stay for (L) hip fracture repair resulting in overall mobility decline as needed for home mngt.  Pt to be participating in daily gait training, transfer/mobility training, strengthening/ROM and endurance & balance training.   Pt is anticipated to progress toward POC goals. She is impacted at this time by pain to (L) LE that limits activity tolerance and effectiveness of PT interventions due to pt reaction to pain.    Plan:   Continue to follow according to established plan of care.    Therapist:   Ulis Rias, PT,  03/26/2022

## 2022-03-26 NOTE — Nurses Notes (Signed)
Patient alert and oriented x4. She takes her meds whole in applesauce. She refused her Melatonin tonight. She was educated that while she is a patient on the unit, she will be assisted to and from the bathroom to ensure her safety at all times. She repositions self every two hours to prevent skin breakdown. Fluids were encouraged to prevent dehydration. Call light within reach. Will continue to monitor.

## 2022-03-26 NOTE — OT Treatment (Signed)
[facility]  Rehabilitation Services  Occupational Therapy Treatment Note    Patient Name: Sheri Garza  Date of Birth: 1935-12-16  Height:  149.9 cm ('4\' 11"'$ )  Weight:  45.4 kg (100 lb)  Room/Bed: 220/A  Payor: MEDICARE / Plan: MEDICARE PART A AND B / Product Type: Medicare /     Date/Time of Admission: 03/17/2022  4:27 PM  Admitting Diagnosis:  Aftercare for healing traumatic closed fracture of left hip [S72.002D]    The patient was seen this session for OT Individual Therapy Minutes: 30 total billable minutes.  The following procedures were performed this session OT Treatment, ea 15 min ([id number]).      Subjective:     Patient with c/o LLE pain, no formal rating given; RN aware.      Objective:   Pt in r/r, finishing toileting routine upon OT arrival to room, pt agreeable to OT session.      OT [place] ([id number]): Pt completed toileting routine with SPV, requiring cues for safety and education on the presence of staffing to help assist to/from r/r. Pt then completed standing grooming tasks at sink including oral care, hand hygiene, and hair care with FWW and SBA to ensure balance and safety. Pt completed functional ambulation from r/r to EOB with FWW and CGA and completed STS transfer to sit EOB with CGA. Pt then completed UB dressing tasks to doff/don shirt with SUA-CGA and completed LB dressing tasks including doff/don underwear and don pants with Min A required to pull pants over B feet. Pt then completed functional bed mobility to go from sit EOB > supine, with Min A for BLE management. Pt required min cues for safety and techniques to improve independence and performance in ADL routine, improve EC/WS, and decrease fall risk with functional activities. Pt demonstrated fair understanding t/o session.      At end of session call bell and phone within reach, heels off-loaded, with pt resting comfortably supine in bed.   Shortness of breath or trouble breathing with exertion (e.g. Walking,  bathing, transferring): no  Shortness of breath or trouble breathing when sitting at rest: no  Shortness of breath or trouble breathing when lying flat:  no    Assessment:   Pt is demonstrating increased progress towards goals in POC including independence in ADL routine, functional activity tolerance, endurance, and functional transfers. Pt required decreased amount of rest breaks t/o session secondary to fatigue, however was able to complete all aspects of ADL routine. Pt provided with education t/o session to improve independence and participation with ADL routine, and strategies to decrease fall risk with pt demonstrating fair understanding.  Plan:   Continue to follow according to established plan of care.    The risks/benefits of therapy have been discussed with the patient/caregiver and he/she is in agreement with the established plan of care.     Therapist:   Shanon Brow, OT,  03/26/2022

## 2022-03-26 NOTE — Nurses Notes (Signed)
Pt alert, oriented and able to make needs known to staff. Regular diet and takes medication whole without difficulty. Transfers with front wheel walker and assist x1. Continues to participate and tolerate therapy services. Denies pain or discomfort. Denies SOB on RA. Contact II precautions maintained and all services provided in pts private room. Pt denies adverse reaction to po Bactrim. Call light within reach.  Elta Guadeloupe, LPN

## 2022-03-27 ENCOUNTER — Encounter (HOSPITAL_BASED_OUTPATIENT_CLINIC_OR_DEPARTMENT_OTHER): Payer: Self-pay | Admitting: Physician Assistant

## 2022-03-27 ENCOUNTER — Ambulatory Visit (HOSPITAL_BASED_OUTPATIENT_CLINIC_OR_DEPARTMENT_OTHER)
Admission: RE | Admit: 2022-03-27 | Discharge: 2022-03-27 | Disposition: A | Discharge status: Home / Self Care | Payer: Medicare Other | Source: Ambulatory Visit

## 2022-03-27 ENCOUNTER — Ambulatory Visit: Payer: Medicare Other | Attending: Physician Assistant | Admitting: Physician Assistant

## 2022-03-27 ENCOUNTER — Other Ambulatory Visit: Payer: Self-pay

## 2022-03-27 VITALS — Ht 59.0 in

## 2022-03-27 DIAGNOSIS — Z9889 Other specified postprocedural states: Secondary | ICD-10-CM

## 2022-03-27 DIAGNOSIS — S72142A Displaced intertrochanteric fracture of left femur, initial encounter for closed fracture: Secondary | ICD-10-CM | POA: Insufficient documentation

## 2022-03-27 DIAGNOSIS — Z09 Encounter for follow-up examination after completed treatment for conditions other than malignant neoplasm: Secondary | ICD-10-CM

## 2022-03-27 DIAGNOSIS — S72142D Displaced intertrochanteric fracture of left femur, subsequent encounter for closed fracture with routine healing: Secondary | ICD-10-CM

## 2022-03-27 NOTE — OT Treatment (Signed)
[facility]  Rehabilitation Services  Occupational Therapy Treatment Note    Patient Name: Sheri Garza  Date of Birth: 1935-03-10  Height:  149.9 cm (4\' 11" )  Weight:  45.4 kg (100 lb)  Room/Bed: 220/A  Payor: MEDICARE / Plan: MEDICARE PART A AND B / Product Type: Medicare /     Date/Time of Admission: 03/17/2022  4:27 PM  Admitting Diagnosis:  Aftercare for healing traumatic closed fracture of left hip [S72.002D]    The patient was seen this session for OT Individual Therapy Minutes: 27 total billable minutes.  The following procedures were performed this session OT Treatment, ea 15 min ([id number]).      Subjective:     Patient with c/o LLE pain, no formal rating; nurse aware. Pt reports "I hope I get good news during this appointment today!"      Objective:   Pt in r/r upon OT arrival to room, pt agreeable to participate in OT session.      OT TX (10626): OT provided education to pt on importance of having a staff member present t/o ADL routine to improve safety and participation. Pt demonstrating fair understanding. Pt completed standing UB bathing tasks with SUA, and seated LB bathing tasks with Mod A. Pt then completed seated UB dressing to don nightgown with SUA, and LB dressing tasks to doff/don socks and pants with Mod A. Pt then completed functional ambulation with FWW and SBA from r/r to EOB and completed STS transfers with SBA. Pt then completed functional bed mobility with Min A for B LE management to obtain midline position. OT provided education and cues t/o session to patient to improve overall independence and safety with ADLs including FWW management, sitting for bathing tasks, and sequencing of tasks with pt demonstrating fair understanding.    At end of session call bell and phone within reach, heels off-loaded, with pt resting comfortably supine in bed.   Shortness of breath or trouble breathing with exertion (e.g. Walking, bathing, transferring): no  Shortness of  breath or trouble breathing when sitting at rest: no  Shortness of breath or trouble breathing when lying flat:  no    Assessment:   Pt participating well in OT and making progress towards goals in POC. Pt demonstrating increased independence and participation in ADL routine. Pt requires min cues t/o session to improve safety and performance in ADLs and decrease fall risk with functional activities.       Plan:   Continue to follow according to established plan of care.    The risks/benefits of therapy have been discussed with the patient/caregiver and he/she is in agreement with the established plan of care.     Therapist:   Shanon Brow, OT,  03/27/2022

## 2022-03-27 NOTE — Nurses Notes (Signed)
Resident transferred to MD appt. Scheduled today for her f/u with her son. Resident transported via personal vehicle with son to appt. Rodena Medin, LPN

## 2022-03-27 NOTE — CAA (Care Area Assessment) (Signed)
Care Area Assessment  Date: March 27, 2022 Time: 11:23    Patient Name: Sheri Garza  Medical Record Number: [id number]  Date of Birth: Dec 09, 1935  Sex: female  Room/Bed: 220/A  Payor Info: Payor: MEDICARE / Plan: MEDICARE PART A AND B / Product Type: Medicare /     Review of Indicators of Urinary Incontinence and Indwelling Catheter:    Modifiable factors contributing to transitory urinary incontinence Supporting Documentation   Delirium (C1300) (See Delirium CAA)        Urinary Tract Infection (I2300)     UTI: [0] No     Atrophic vaginitis in postmenopausal women (I8000)    Medications (see below)    Psychological or psychiatric problems (I5700-I6100)     Anxiety Disorder: [0] No      Depression: [0] No      Manic Depression (Bipolar Disorder): [0] No      Psychotic Disorder: [0] No      Schizophrenia: [0] No      PTSD: [0] No       Constipation/impaction (O940079, clinical record)     [0] No     Caffeine use    Excessive fluid intake    Pain (J0300)     Pain in last 5 days: [1] Yes       Environmental factors    Restricted mobility (G0110.1.A-F = 2, 3, or 4) (G0110.2.A-F = 2 or 3) (See ADL CAA)    Lack of access to a toilet    Other environmental barriers (such as pads or briefs)    Restraints (P0100)            Other factors that contribute to incontinence or catheter use Supporting Documentation   Excessive or inadequate urine output    Urinary urgency AND need for assistance in toileting [id number].I = 2, 3, or 4)    Bladder cancer (I0100) or stones (I8000)     [0] No       Spinal cord or brain lesions (I8000)    Tabes dorsalis (I8000)    Neurogenic bladder [id number])      Neurogenic Bladder: [0] No           Laboratory tests Supporting Documentation   High serum calcium    High blood glucose    Low B12    High BUN or creatinine      Diseases and conditions Supporting Documentation   Benign prostatic hypertrophy (I1400)     [0] No     Congestive Heart Failure (CHF), pulmonary edema [id number])     Heart Failure: [0] No        Cerebrovascular Accident (CVA) [id number])     CVA or TIA: [0] No       Transient Ischemic Attach (TIA) (I4500)     CVA or TIA: [0] No     Diabetes (I2900)     [0] No       Depression (I5800)    Depression: [0] No       Parkinson's disease (I5300)     Parkinson's Disease: [0] No       Prostate cancer (I0100)     [0] No         Type of incontinence Supporting Documentation   Stress (occurs with coughing, sneezing, laughing, lifting heavy objects, etc.)    Urge (overactive or spastic bladder)    Mixed (stress incontinence with urgency)    Overflow (due to blocked urethra or weak bladder muscles)    Transient (temporary/occasional  related to a potentially improvable/reversable cause)    Functional (can't get to toilet in time due to physical disability, external obstacles, or problems thinking or communicating)      Medications (from medication administration record and preadmission records if new admission; review by consultant pharmacist) Supporting Documentation   Diuretics (N0410G) - can cause urge incontinence           Sedative hypnotics (N01410B, N0410D)                  Anticholinergics - can lead to overflow incontinence:  Parkinson's medications (excepts Sinemet and Deprenyl)  Disopyramide  Antispasmodics  Antihistamines  Antipsychotics (N0410A)  Antidepressants (N0410C)  Narcotics                    Drugs that stimulate or block sympathetic nervous system    Calcium channel blockers      Use of indwelling catheter (H0100 is checked): (Presence of situation in which catheter use may be appropriate intervention after consideration of risks/benefits and after efforts to avoid catheter use have been unsuccessful) Supporting Documentation   Coma (B0100)     Comotose: [0] No       Terminal illness (O0100K)       Stage 3 or 4 pressure ulcer in area affected by incontinence    Need for exact measurement of urine output    History of inability to void after catheter removal        Input from resident and/or  family/representative regarding the care area.  (Questions/Comments/Concerns/Preferences/Suggestions)        Analysis of Findings  Care Plan Considerations       Review indicators and supporting documentation, and draw conclusions  Document:  Description of the problem;  Causes and contributing factors, and  Risk factors related to the care area.   Care Plan       Yes   Document reason(s) the care plan will/will not be developed.      Review of medical records 3/4-3/10/2022.  Resident needs staff assistance with toileting. Resident is receiving therapy. Proceed to care plan. Refer to care plan.     Referral(s) to another discipline(s) is warranted (to whom and why): No        Information regarding the CAA transferred to the CAA Summary (Section V of the MDS)? _Yes__

## 2022-03-27 NOTE — CAA (Care Area Assessment) (Signed)
Care Area Assessment  Date: March 27, 2022 Time: 11:26    Patient Name: Sheri Garza  Medical Record Number: [id number]  Date of Birth: [birth year]  Sex: female  Room/Bed: 220/A  Payor Info: Payor: MEDICARE / Plan: MEDICARE PART A AND B / Product Type: Medicare /     Review of Indicators of Fall Risk:    History of falling (J1700, J1800, J1900) Supporting Documentation   Time of day, exact hour of the fall(s)    Location of the fall(s), such as bedroom, bathroom, hallway, stairs, outside, etc.    Related to specific medication    Proximity to most recent meal    Responding to bowel or bladder urgency    Doing usual/unusual activity    Standing still or walking    Reaching up or reaching down    Identify the conclusions about the root cause(s), contributing factors related to previous falls.      Physical performance limitations: balance, gait, strength, muscle endurance (G0300A-G0300E) Supporting Documentation   Difficulty maintaining sitting balance    Need to rock body or push off on arms of chair when standing up from chair.    Difficulty maintaining standing position.    Impaired balance during transitions (G0300A-G0300E)    Gait problem, such as unsteady gait, even with mobility aid or personal assistance, slow gait, takes small steps, takes rapid steps, or lurching gait.    One leg appears shorter than the other.    Musculoskeletal problem, such as kyphosis, weak hip flexors from extended bed rest, or shortening of a leg.      Medications Supporting Documentation   Antipsychotics (N0410A)           Antianxiety agents (N0410B)           Antidepressants (N0410C)           Hypnotics (N0410D)           Cardiovascular medications (from medication administration record)    Diuretics (N0410G) (from medication administration record)        Narcotic analgesics (from medication administration record)    Neuroleptics (from medication administration record)    Other medications that cause lethargy or confusion (from  medication administration record)      Internal risk factors (from diagnosis list and clinical indicators) Supporting Documentation   Circulatory/Heart  Anemia (I0200)  Cardiac Dysrhythmias (I0300)  Angina, Myocardial Infarction (MI), Atherosclerotic Heart Disease (ASHD) (I0400)  Congestive Heart Failure (CHF) pulmonary edema [id number])  Cerebrovascular Accident (CVA) (I4500)  Transient Ischemic Attack (TIA) (I4500)  Postural/Orthostatic hypotension [id number])     Anemia: [1] Yes      Dysrythmias: [0] No      Coronary Artery Disease: [0] No      Heart Failure: [0] No      CVA or TIA: [0] No         Neuromuscular/functional  Cerebral palsy (I4400)  Loss of arm or leg movement (G0400)  Decline in functional status (G0110)  Incontinence (H0300, H0400)  Hemiplegia/Hemiparesis (I4900)  Parkinson's disease (I5300)  Seizure disorder (I5400)  Paraplegia (I5000)  Multiple sclerosis (I5200)  Traumatic brain injury (I5500)  Syncope  Chronic or acute condition resulting in instability  Peripheral neuropathy  Muscle weakness      Cerebral Palsy: [0] No       Urinary Continence: [0] Always continent       Bowel Continence: [2] Frequently incontinent       Hemiplegia or Hemiparesis: [0] No  Parkinson's Disease: [0] No       Seizure disorder or epilepsy: [0] No       Paraplegia: [0] No       Multiple Sclerosis: [0] No       Orthopedic  Joint pain  Arthritis ([id number])  Osteoporosis (I3800)  Hip fracture (I3900)  Missing limb(s) (G0600D)     Arthritis: [0] No      Osteoporosis: [1] Yes      Hip Fracture: [1] Yes            Perceptual  Visual impairment (B1000)  Hearing impairment (B0200)  Dizziness/vertigo    Vision: [0] Adequate     [0] Adequate       Psychiatric or cognitive  Impulsivity or poor safety awareness  Delirium (C1300)  Wandering [id number])  Agitation behavior (E0200) - describe the specific verbal or motor activity - e.g. screaming, babbling, cursing, repetitive questions, pacing, kicking, scratching, etc.  Cognitive  impairment (C0500, C0700-C1000)  Alzheimer's disease (I4200)  Other dementia [id number])  Anxiety disorder (I5700)  Depression (I5800)  Manic depression (I5900)  Schizophrenia (I6000)          Wandering: [0] Behavior not exhibited      Physical behavior directed towards others: [0] Behavior not exhibited      Verbal behavior towards others: [0] Behavior not exhibited      Other behavior symptoms: [0] Behavior not exhibited        BIMS Score: 15          Alzheimer's Disease: [0] No      Non-Alzheimer's Dementia: [0] No      Anxiety Disorder: [0] No      Depression: [0] No      Manic Depression (Bipolar Disorder): [0] No      Schizophrenia: [0] No       Infection (I1700-I2500)     MDRO: [0] No      Pneumonia: [0] No      Septicemia: [0] No      UTI: [0] No      Viral Hepatitis: [0] No      Wound Infection: [0] No         Low levels of physical activity    Pain (J0300)    Pain in last 5 days: [1] Yes     Headache    Fatigue, weakness    Vitamin D deficiency      Laboratory tests Supporting Documentation   Hypo- or hyperglycemia            Electrolyte imbalance      Dehydration (J1550C)       Dehydration: [0] No     Hemoglobin and hematocrit         Environmental factors (from review of facility environment) Supporting Documentation   Poor lighting    Glare    Patterned carpet    Poorly arranged furniture    Uneven surfaces    Slippery floors    Obstructed walkway    Poor fitting or slippery shoes    Proximity to aggressive resident      Input from resident and/or family/representative regarding the care area.  (Questions/Comments/Concerns/Preferences/Suggestions)        Analysis of Findings  Care Plan Considerations       Review indicators and supporting documentation, and draw conclusions  Document:  Description of the problem;  Causes and contributing factors, and  Risk factors related to the care area.   Care Plan  Yes   Document reason(s) the care plan will/will not be developed.       Review of medical records  3/4-3/10/2022.  Resident has history of falls prior to admission with one resulting in a fracture. Resident is at risk for falls. Proceed to care plan. Refer to care plan.     Referral(s) to another discipline(s) is warranted (to whom and why): No        Information regarding the CAA transferred to the CAA Summary (Section V of the MDS)? Yes___

## 2022-03-27 NOTE — CAA (Care Area Assessment) (Signed)
Care Area Assessment  Date: March 27, 2022 Time: 11:18    Patient Name: Sheri Garza  Medical Record Number: [id number]  Date of Birth: [birth year]  Sex: female  Room/Bed: 220/A  Payor Info: Payor: MEDICARE / Plan: MEDICARE PART A AND B / Product Type: Medicare /     Review of Indicators of Cognitive Loss/Dementia:    Reversible causes of cognitive loss Supporting Documentation   Delirium (C1300) CAA triggered (Immediate follow-up required. Perform the Delirium CAA to determine possible causes, contributing factors, etc. and go directly to care planning for those issues, then continue below.)        Delilrium present per CAM:         Neurological factors Supporting Documentation   Intellectual disability/Developmental disability [id number])       Down Syndrome: [0] No      Autism: [0] No      Epilepsy: [0] No      Other organic condition: [0] No      ID/DD with no organic condition: [0] No       Alzheimer's Disease or other dementias [id number])       Alzheimer's Disease: [0] No      Non-Alzheimer's Dementia: [0] No       Parkinson's Disease ([id number])       Parkinson's Disease: [0] No       Traumatic brain injury [id number])     Traumatic Brain Injury: [0] No       Brain tumor (clinical record)      Normal pressure hydrocephalus    Other (clinical record, I8000)     Encounter for other orthopedic aftercare         Observable characteristics and extent of this resident's cognitive loss Supporting Documentation   Analyze component of Brief Interview for Mental Status (BIMS) (C0200-C0500) (V0100D)           BIMS Score: 15         If unable to complete BIMS, analyze components of Staff Assessment for Mental Status (C0700, C0800, C0900, C1000)         Identify components of Delirium assessment (C1300) that are present and not new onset or worsening.         Mental status change: [0] No       Inattention: [0] Behavior not present       Disorganized Thinking: [0] Behavior not present       Altered Level of consciousness: [0] Behavior  not present     Confusion, disorientation, forgetfulness (observation, clinical record) (C0200, C0300, C0400, C0500, C0700, C0800, C0900, C1300)     BIMS Score: 15           Decreased ability to make self understood (B0700) or to understand others (B0800)         [0] Understood      [0] Understands       Impulsivity (observation, clinical record)    Other (observation, clinical record)      Mood and behavior Supporting Documentation   Mood State CAA triggered. Analysis of Findings indicated possible impact on cognition - important to consider when drawing conclusions about cognitive loss.           Behavioral Symptoms (E0200) CAA triggered. Analysis of Findings points to cause(s), contributing factors, etc. important to consider when drawing conclusions about cognitive loss.   Physical behavior directed towards others: [0] Behavior not exhibited      Verbal behavior towards others: [0] Behavior not exhibited  Other behavior symptoms: [0] Behavior not exhibited         Medical problems that can impact cognition Supporting Documentation   Constipation (H0600), fecal impaction, diarrhea     [0] No       Diabetes (I2900)     [0] No       Thyroid Disorder (I3400)     [0] No       Congestive heart failure (I0600)/other cardiac diseases (I0300, I0400)     Dysrythmias: [0] No      Coronary Artery Disease: [0] No       Respiratory problems (I6200, I6300, I2000, I2200, I8000)/decreased oxygen saturation     Asthma, COPD or chronic lung disease: [0] No      Respiratory Failure: [0] No      Pneumonia: [0] No      Tuberculosis: [0] No         .   Cancer (I0100)      [0] No       Liver disease (I1100, I2400, I8000, clinical record)     Cirrhosis: [0] No      Viral Hepatitis: [0] No         Renal failure (I1500)     [0] No     Psychiatric or mood disorder (I5700-I6100)     Anxiety Disorder: [0] No      Depression: [0] No      Manic Depression (Bipolar Disorder): [0] No      Psychotic Disorder: [0] No      Schizophrenia: [0]  No      PTSD: [0] No       Electrolyte imbalance (clinical record)         Poor nutrition (I5600) or hydration status (J1550C) (clinical record)     Poor Nutrition: [1] Yes        Dehydration: [0] No       End of life (Hospice O0100K and clinical record)             Alcoholism             Failure to thrive             Pain and its relationship to cognitive loss Supporting Documentation   Indications that pain is present (J0100, J0300, J0400, J0500, J0600, J0700, J0800, [id number])         Pain in last 5 days: [1] Yes       Pain CAA triggered. Determine relationship between pain and cognitive status via observation and assessment.      Functional status and its relationship to cognitive loss Supporting Documentation   Activities of Daily Living (ADL) status (Section G)  ADL CAA triggered (G0110). Analysis of Findings provides important information about relationship of ADL decline to cognitive loss. (C0500, C0700, C0800, C0900, C1000, V0100D)  Resident has potential for more independence with cueing, restorative nursing program, and/or task segmentation, or other programs. (G0600, O0100-O0500)           BIMS Score: 15           Decline in continence (H0300, H0400, clinical record)     Urinary Continence: [0] Always continent      Bowel Continence: [2] Frequently incontinent     Impaired daily decision-making (C1000, clinical record)         Participates better in small group programs (F0800P, observation, clinical record)          Staff and/or resident believe resident is capable of doing more (  G0900)            Other Considerations Supporting Documentation   Cognitive decline occurred slowly over time. (V0100D)    Unexplainable behavior may be attempt at communication about pain, toileting needs, uncomfortable position, etc.    Use of physical restraints (P0100)    Hearing or vision impairment (B0200, B0300, B1000, B1200) - may have an impact on ability to process information (direction, reminders, environmental cues)          [0] Adequate      USE OF HEARING AID: [0] No      Vision: [0] Adequate      Corrective Lenses: [1] Yes      .   Lack of frequent reorientation, reassurance, reminders to help make sense of things (C0900, C1300)       Mental status change: [0] No      Inattention: [0] Behavior not present      Disorganized Thinking: [0] Behavior not present      Altered Level of consciousness: [0] Behavior not present       Interference with resident's ability to get enough sleep (noise, light, etc.) (D0200C, D0500C)                 Noisy or chaotic environment (for example, calling out, loud music, constant commotion, frequent caregiver changes)        Input from resident and/or family/representative regarding the care area.  (Questions/Comments/Concerns/Preferences/Suggestions)                        Analysis of Findings  Care Plan Considerations       Review indicators and supporting documentation, and draw conclusions  Document:  Description of the problem;  Causes and contributing factors, and  Risk factors related to the care area.   Care Plan       No   Document reason(s) the care plan will/will not be developed.       Review of medical records from 3/4-3/10/2022.  Resident had rejection of care of refusing medication. Resident BIMs is 15 and she has capacity to make her own decisions. Proceed not to care plan for cognition. Refer to refusal of medication care plan.     Referral(s) to another discipline(s) is warranted (to whom and why): No        Information regarding the CAA transferred to the CAA Summary (Section V of the MDS)? _Yes__

## 2022-03-27 NOTE — Care Management Notes (Signed)
[facility]  Care Management Note    Patient Name: Sheri Garza  Date of Birth: [birth year]  Sex: female  Date/Time of Admission: 03/17/2022  4:27 PM  Room/Bed: 220/A  Payor: MEDICARE / Plan: MEDICARE PART A AND B / Product Type: Medicare /    LOS: 10 days   Primary Care Providers:  [facility]    Admitting Diagnosis:  Aftercare for healing traumatic closed fracture of left hip [S72.002D]    Assessment:      03/27/22 1527   Assessment Details   Assessment Type Continued Assessment   Date of Care Management Update 03/27/22   Date of Next DCP Update 03/31/22   Care Management Plan   Discharge Planning Status plan in progress   Projected Discharge Date 04/09/22   Discharge plan discussed with: Patient  Evorn Gong)   Discharge Needs Assessment   Equipment Needed After Discharge commode;other (see comments)  (FWW)   Discharge Facility/Level of Care Needs Home with Home Health and DME (code 6)   Transportation Available family or friend will provide     Pt's son came in from [place] to take pt to her ortho appointment today. He arrived at facility prior to appointment and asked to meet with MSW and therapy. Therapy was able to provide son with an update on her progress as well as recommendations for her at time of discharge. MSW met with pt and her son to discuss. Plan continues to be to discharge to her grandchildren's home in [place] but only for a short time. Plan will be for pt to rent a hospital bed in [place]; MSW provided pt's son with a list of DME providers for the [place], [place] zip code, as well as, a list of Home Health agencies. MSW should be able to make referral to [facility] agency in [place]. Family would also like DME orders for a BSC and a FWW, they would like to use a local supplier for this DME. MSW recommended family reach out to pt's PCP to schedule an appointment and coordinate that appointment with when she will be returning to [facility]. MSW  explained that PCP will need to order [facility] at that time, as well as, hospital bed. Pt and son verbalized understanding.     Discharge Plan:  Home with Home Health and DME (code 6)  Pt will be meeting with ortho today and will have a better idea on her ability following that appointment. At this time therapy is recommending 2 additional weeks. MSW will meet with pt tomorrow regarding tentative discharge to [place] date.     The patient will continue to be evaluated for developing discharge needs.     Case Manager: Dorna Bloom, [place]  Phone: [phone]

## 2022-03-27 NOTE — CAA (Care Area Assessment) (Signed)
Care Area Assessment  Date: March 27, 2022 Time: 11:31    Patient Name: Sheri Garza  Medical Record Number: [id number]  Date of Birth: [birth year]  Sex: female  Room/Bed: 220/A  Payor Info: Payor: MEDICARE / Plan: MEDICARE PART A AND B / Product Type: Medicare /     Review of Indicators of Pain:    Diseases and conditions that may cause pain (diagnosis OR signs/symptoms present) Supporting Documentation   Cancer (I0100)     [0] No       Circulatory/heart  Angina, Myocardial Infarction (MI), Atherosclerotic Heart Disease (ASHD) (I0400)  Deep Vein Thrombosis (I0500)  Peripheral Vascular Disease [id number])    Coronary Artery Disease: [0] No       [0] No       [0] No       Skin/Wound  Pressure ulcer (Section M)  Other ulcers, wounds, incision, skin problems (M1040)  Moisture associated skin damage (M1040H)     MASD: [0] No     Infection  Urinary tract infection I2300)  Pneumonia (I2000)        Pneumonia: [0] No       UTI: [0] No       Neurological (I4200-I5500)  Head trauma (clinical record)  Headache  Neuropathy  Post-stroke syndrome   Alzheimer's Disease: [0] No         Gastrointestinal  Gastroesophageal Reflux Disease/Ulcer (I1200)  Ulcerative Colitis/Crohn's Disease/Inflammatory Bowel Disease (I1300)       [0] No      .   Hospice care (O0100K)             Musculoskeletal  Arthritis (I3700)  Osteoporosis (I3800)  Hip fracture (I3900)  Other fracture (I4000)  Back problems (I8000)  Amputation (O0500)  Other (I8000)    Arthritis: [0] No       Osteoporosis: [1] Yes       Hip Fracture: [1] Yes       Other Fracture: [0] No                     Dental problems (Section L) (L0200)     No natural teeth: [0] No      Abnormal mouth tissue: [0] No      Cavity or broken teeth: [1] Yes      Inflamed, bleeding gums, loose teeth: [0] No      Mouth of facial pain: [0] No       Characteristics of the pain Supporting Documentation   Location    Type (constant, intermittent, varies over time, etc.)    What makes it better    What makes it  worse    Words that describe it (for example, aching, soreness, dull, throbbing, crushing)  Burning, pins and needles, shooting, numbness (neuropathic)  Cramping, crushing, throbbing, stabbing (musculoskeletal)  Cramping, tightness (visceral)         Frequency and intensity of the pain (J0400, J0600, [id number]) Supporting Documentation   How often it occurs               Time or situation of onset         How long it lasts       Non-verbal indicators of pain (particularly important if resident is stoic) Supporting Documentation   Facial expression (frowning, grimacing, etc.) (J0800A, J0800C)        Vocal behaviors (signing, moaning, groaning, crying, etc.) (J0800A, J0800B)          Body position (guarding,  distorted posture, restricted limb movement, etc.) (J0800D)        Restlessness      Pain effect on function Supporting Documentation   Disturbs sleep (J0500A)           Decreases appetite (clinical record)    Adversely affects mood (D0200, D0500, clinical record)                 Limits day-to-day activities (J0500B) (social events, eating in dining room, etc.)           Limits independence with at least some Activities of Daily Living (ADLs) (G0110)      Associated signs and symptoms Supporting Documentation   Agitation or new or increased behavior problems (E0200) - describe the specific verbal or motor activity - e.g. Screaming, babbling, cursing, repetitive questions, pacing, kicking, scratching, etc.     Physical behavior directed towards others: [0] Behavior not exhibited      Verbal behavior towards others: [0] Behavior not exhibited      Other behavior symptoms: [0] Behavior not exhibited     Delirium (C1600)         Withdrawal      Other Considerations Supporting Documentation   Improper positioning (M1200C)     Turning and repositioning: [0] No     Contractures          Immobility (G0110)    Use of restraints (P0100)     Recent change in pain (characteristics, frequency, intensity, etc.) (J0400, J0600)                          Insufficient pain relief (from resident/staff interview, clinical record, direct observation) (J0100-J0850)      Pain in last 5 days: [1] Yes                 Pain relief occurs, but duration is not sufficient, resulting in breakthrough pain [id number])        Input from resident and/or family/representative regarding the care area.  (Questions/Comments/Concerns/Preferences/Suggestions)        Analysis of Findings  Care Plan Considerations       Review indicators and supporting documentation, and draw conclusions  Document:  Description of the problem;  Causes and contributing factors, and  Risk factors related to the care area.   Care Plan      Yes   Document reason(s) the care plan will/will not be developed.       Review of medical records 3/4-3/10/2022  Resident is at risk for pain. Status post left hip surgery/orthropedic aftercare. Proceed to care plan. Refer to care plan.     Referral(s) to another discipline(s) is warranted (to whom and why): No        Information regarding the CAA transferred to the CAA Summary (Section V of the MDS)? __Yes_

## 2022-03-27 NOTE — CAA (Care Area Assessment) (Signed)
Care Area Assessment  Date: March 27, 2022 Time: 11:28    Patient Name: Sheri Garza  Medical Record Number: [id number]  Date of Birth: 1935-07-19  Sex: female  Room/Bed: 220/A  Payor Info: Payor: MEDICARE / Plan: MEDICARE PART A AND B / Product Type: Medicare /     Review of Indicators of Pressure Ulcers:    Existing pressure ulcer(s) (M0100) Supporting Documentation   Assess location, size, stage, presence, and type of drainage, presence of odors, condition of surrounding skin [id number])  Note if eschar or slough is present (M0300F)                     If the ulcer does not show signs of healing despite treatment, consider complicating factors:  Elevated bacterial level in the absence of clinical infection  Presence of exudate, necrotic debris or slough in the wound, too much granulation tissue, or odor in the wound bed  Underlying osteomyelitis (bone infection)      Extrinsic risk factors Supporting Documentation   Pressure  Requires staff assistance to move sufficiently to relieve pressure over any one site  Confined to a bed or chair all or most of the time  Needs special mattress or seat cushion to reduce or relieve pressure (M1200A, M1200B)  Requires regular schedule of turning (M1200C)             Turning and repositioning: [0] No       Pressure reducing device for chair: [1] Yes       Pressure reducing device for bed: [1] Yes        Friction and shear  Slides down in the bed  Moved by sliding rather than lifting    Maceration  Persistently wet, especially from fecal incontinence, wound drainage, or perspiration  Moisture associated skin damage (M1040H)       MASD: [0] No       Intrinsic risk factors Supporting Documentation   Immobility (G0110)     Altered mental status  Delirium limits mobility (see Delirium CAA)  Cognitive loss (C0500, C0700-C1000) limits mobility (see Cognitive Loss CAA)   BIMS Score: 15      BIMS Score: 15               Incontinence (H0300, H0400, M1040H) (see Incontinence CAA)      Urinary  Continence: [0] Always continent       Bowel Continence: [2] Frequently incontinent           Poor nutrition (see Nutrition CAA)      Medications that increase risk for pressure ulcer development Supporting Documentation   Antipsychotics (N0410A)           Antianxiety agents (N0410B)           Antidepressants (N0410C)          Hypnotics (N0410D)             Steroids    Narcotics      Diagnoses and conditions that present complications or increase risk for pressure ulcers Supporting Documentation   Delirium (C1600)          Comatose (B0100)     Comotose: [0] No       Cancer (I0100)  [0] No       Peripheral Vascular Disease (I0900)     [0] No       Diabetes (I2900)     [0] No       Alzheimer's disease (I4200)  Alzheimer's Disease: [0] No       Cerebrovascular Accident [phone])     CVA or TIA: [0] No       Other dementia (I4800)     Non-Alzheimer's Dementia: [0] No       Hemiplegia/hemiparesis (I4900)     Hemiplegia or Hemiparesis: [0] No       Paraplegia (I5000), Quadriplegia (I5100)     Paraplegia: [0] No      Quadriplegia: [0] No       Multiple sclerosis (I5200)     Multiple Sclerosis: [0] No       Depression (D0300, D0600, I5800)                Depression: [0] No       Edema    Severe pulmonary disease [id number])     Asthma, COPD or chronic lung disease: [0] No       Sepsis (I2100)     Septicemia: [0] No       Terminal illness (O0100K)    Chronic or end-stage renal (I1500), liver, or heart disease (I0400, I0600)     [0] No       Pain (J0300)    Pain in last 5 days: [1] Yes     Dehydration (J1500C, I8000)     Dehydration: [0] No     Shortness of breath (J1100)   .    Short of Breath with exertion: [0] No      short of breath at rest: [0] No      Short of breath when lying flat: [0] No       Recent weight loss (K0300)    Weight loss: [0] No or unknown     Recent weight gain [id number])    Weight Gain: [0] No or unknown     Malnutrition (I5600)     Poor Nutrition: [1] Yes       Decreased sensory perception    Recent  decline in Activities of Daily Living (ADLs) [id number])      Treatments and other factors that cause complications or increase risk Supporting Documentation   Newly admitted or readmitted (A1700)    Chemotherapy (O0100A)      Radiation therapy (O0100B)             Ventilator or respirator (O0100F)             Renal dialysis (O0100J)           Functional limitation in range of motion (G0400)                  Head of bed elevated most of the time    Physical restraints (P0100)    Devices that can cause pressure, such as oxygen (O0100C) or indwelling catheter (H0100A) tubing, TED hose, casts, or splints.     [0] No         Input from resident and/or family/representative regarding the care area.  (Questions/Comments/Concerns/Preferences/Suggestions)        Analysis of Findings  Care Plan Considerations       Review indicators and supporting documentation, and draw conclusions  Document:  Description of the problem;  Causes and contributing factors, and  Risk factors related to the care area.   Care Plan       Yes Document reason(s) the care plan will/will not be developed.       Review of medical records 3/4-3/10/2022  Resident is at risk for skin breakdown. Has impaired mobility  and risk for malnutrition and weakness. Proceed to care plan. Refer to care plan.     Referral(s) to another discipline(s) is warranted (to whom and why): no        Information regarding the CAA transferred to the CAA Summary (Section V of the MDS)? _yes__

## 2022-03-27 NOTE — CAA (Care Area Assessment) (Signed)
Care Area Assessment  Date: March 27, 2022 Time: 11:25    Patient Name: Sheri Garza  Medical Record Number: [id number]  Date of Birth: Feb 25, 1935  Sex: female  Room/Bed: 220/A  Payor Info: Payor: MEDICARE / Plan: MEDICARE PART A AND B / Product Type: Medicare /     Review of Indicators of Behavioral Symptoms:    Seriousness of the behavioral symptoms (E0300, E0800, E1100) Supporting Documentation   Resident is immediate threat to self - IMMEDIATE INTERVENTION REQUIRED (D0200I.1 = 1, D0500I.1 = 1, E1000 = 1)        Resident is immediate threat to others - IMMEDIATE INTERVENTION REQUIRED (E0600A)         Nature of the behavioral disturbance (resident interview, if possible; staff observations) Supporting Documentation   Provoked    Unprovoked    Offensive    Defensive    Purposeful    Occurs during specific activities, such as bath or transfers.    Pattern, such as certain times of the day.    Varies over time.    Others in the vicinity involved.    Reaction to a particular action, such as being physically moved.    Resident appears to startle easily.            Medication side effects that can cause behavioral symptoms (from medication records) Supporting Documentation   New medications       Change in dosage    Antiparkinsonian drugs - may cause hypersexuality, socially inappropriate behavior.    Sedatives, centrally active antihypertensives, some cardiac drugs, anticholinergic agents can cause paranoid delusions, delirium.    Bronchodilators or other respiratory drugs, which can increase agitation and cause difficulty sleeping.    Caffeine    Nicotine    Medications that impair impulse control, such as benzodiazepines, sedatives, alcohol (or any product containing alcohol, such as cough medicine).      Illness or conditions that can cause behavior problems Supporting Documentation   Long-standing mental health problem associated with the behavioral disturbances, such as schizophrenia, bipolar disorder, depression,  anxiety disorder, post-traumatic stress disorder (I5700-I6100).   Anxiety Disorder: [0] No        Depression: [0] No      Manic Depression (Bipolar Disorder): [0] No      Psychotic Disorder: [0] No      Schizophrenia: [0] No      PTSD: [0] No         New or acute physical health problem or flare-up of a known chronic condition (I8000).      Encounter for other orthopedic aftercare       Delusions (E0100B)     Delusions: [0] No       Hallucinations (E0100A)     Hallucinations: [0] No       Paranoia (from record)    Constipation (H0600)      [0] No     Congestive heart failure (I0600)       Heart Failure: [0] No       Infection (I1700-I2500)     MDRO: [0] No      Pneumonia: [0] No      UTI: [0] No      Wound Infection: [0] No     Head injury (I5500, clinical record)       Traumatic Brain Injury: [0] No       Diabetes (I2900)       [0] No       Pain (J0300, J0800)  Pain in last 5 days: [1] Yes         Fever (J1550A, clinical record)       Fever: [0] No     Dehydration (J1550C, clinical record; see Dehydration CAA)       Dehydration: [0] No       Factors that can cause or exacerbate the behavior (from observation, interview, record) Supporting Documentation   Frustration due to problem communicating discomfort or unmet need.    Frustration, agitation due to need to urinate or have bowel movement.    Fear due to not recognizing caregiver.    Fear due to not recognizing the environment or misinterpreting the environment or actions of others.    Major unresolved sources of interpersonal conflict between the resident and family members, other residents, or staff (see Psychosocial Well-Being CAA)    Recent change, such as new admission or a new unit, assignment of new care staff, or withdrawal from a treatment program.        Departure from normal routines.    Sleep disturbance (D0500C = 1)           Noisy, crowded area    Dimly lit area    Sensory impairment, such as hearing or vision problem (B0200, B1000)        [0]  Adequate       Vision: [0] Adequate     Restraints (P0100)    Fatigue (D0500D = 1)           Need for repositioning (M1200)    Turning and repositioning: [0] No       Cognitive status problems (also see Cognitive Loss CAT/CAA) Supporting Documentation   Delirium (C1300, clinical record, Delirium CAT)         Dementia (I4800)       Non-Alzheimer's Dementia: [0] No       Recent cognitive loss (clinical record, interviews with family, etc.)    Alzheimer's disease [id number]) Alzheimer's Disease: [0] No       Effects of cerebrovascular accident [id number]) CVA or TIA: [0] No         Other Considerations Supporting Documentation   May be communicating discomfort, personal needs, preferences, fears, feeling ill.    Persons exhibiting long-standing problem behaviors related to psychiatric conditions may place others in danger of physical assault, intimidation, or embarrassment and place themselves at increased risk of being stigmatized, isolated, abused, and neglected by loved ones or caregivers.    The actions and responses of family members and caregivers can aggravate or even cause behavioral outbursts.        Input from resident and/or family/representative regarding the care area.  (Questions/Comments/Concerns/Preferences/Suggestions)        Analysis of Findings  Care Plan Considerations       Review indicators and supporting documentation, and draw conclusions  Document:  Description of the problem;  Causes and contributing factors, and  Risk factors related to the care area.   Care Plan       Yes   Document reason(s) the care plan will/will not be developed.       Review of medical records 3/4-3/10/2022  Resident had refusal of medication in look back period. Proceed to care plan. Refer to care plan     Referral(s) to another discipline(s) is warranted (to whom and why): No        Information regarding the CAA transferred to the CAA Summary (Section V of the MDS)? _Yes__

## 2022-03-27 NOTE — Nurses Notes (Signed)
Resident alert and oriented times three, verbally makes needs known. No ADR noted r/t ABT therapy. Resident had no noted GI symptoms per shift. All care and therapy performed in room at bedside Contact II continues r/t Norovirus, Yersinia enterocolitica, and Cdiff.  Resident takes meds whole. Eats regular diet. No c/o pain at this time. Resident transferred via son per shift to f/u appt. With Ortho. R/t Right hip. Resident returned to facility via private vehicle with son and staples removed from left hip, new order for Ted hose to be applied in am and removed HS to BLE. Resident had skin check and no noted new skin issues. Resident may take a shower and note in Epic. BP 122/68   Pulse 94   Temp 36.4 C (97.5 F)   Resp 17   Ht 1.499 m (4\' 11" )   Wt 45.4 kg (100 lb)   SpO2 95%   BMI 20.20 kg/m Rodena Medin, LPN

## 2022-03-27 NOTE — Care Plan (Signed)
[facility]  [place]  Speech Therapy Progress Note    Patient Name: Sheri Garza  Date of Birth: Sep 02, 1935  Weight:  45.4 kg (100 lb)  Room/Bed: 220/A  Payor: MEDICARE / Plan: MEDICARE PART A AND B / Product Type: Medicare /      Date/Time of Admission: 03/17/2022  4:27 PM  Admitting Diagnosis:  Aftercare for healing traumatic closed fracture of left hip [S72.002D]        Assessment:  Ongoing cognitive communication deficits impacting safety and independence with daily routines and complex tasks.    Plan:  Recommendations: ST to continue POC addressing ongoing functional deficits in POC   Results & Recommendations Discussed With:Patient, Nurse, and MD   Continue to follow patient according to established plan of care.  The risks/benefits of therapy have been discussed with the patient/caregiver and he/she is in agreement with the established plan of care.       Subjective:  Flowsheet Info:     03/27/22 1050   Therapist Pager   SLP Pager Debe Coder [phone]   Rehab Session   Document Type therapy progress note (daily note)   Total SLP Minutes: 42   Patient Effort good   Symptoms Noted During/After Treatment none   General Information   Patient Profile Reviewed yes     Pt was awake, alert, upright in bed and breathing on room air comfortably. Pt was agreeable to tx session.    Objective:  Pt and son expressed Pt's difficulty utilizing cell phone features to make and receive calls independently. SLP utilized this functional task to address problem-solving, working memory, and attention skills. Pt demonstrated problem-solving skills while trouble-shooting errors and navigating phone features with repetition, models, and verbal cues. Pt was able to sustain attention during tasks for up to 5 min with min-mod verbal cues for redirection to increase her ability to process and encode complex information. SLP reviewed and utilized compensatory recall strategies including repetition,  visualization, and writing down information in order to improve her ability to recall steps to complete tasks on phone (ie: how to answer a call, how to send a text message). Pt with positive response to treatment. Pt was left in good condition with call light and bedside table within reach.      Therapist:  Sandrea Hammond, ST   Pager #: [phone]  Phone #: [phone]  Treatment Time: 42 minutes

## 2022-03-27 NOTE — PT Treatment (Signed)
[facility]  [place], [place] [address]  Rehabilitation Services  Physical Therapy Treatment Note    Patient Name: Sheri Garza  Date of Birth: 06-12-1935  Height:  149.9 cm (4\' 11" )  Weight:  45.4 kg (100 lb)  Room/Bed: 220/A  Payor: MEDICARE / Plan: MEDICARE PART A AND B / Product Type: Medicare /     Date/Time of Admission: 03/17/2022  4:27 PM  Admitting Diagnosis:  Aftercare for healing traumatic closed fracture of left hip [S72.002D]    The patient was seen this session for: PT Individual Therapy Minutes: 55 total individual billable minutes    The patient was seen this session for 55 total billable minutes.    The following procedures were performed this session: Therapeutic Exercise, ea 15 min (97110), Therapeutic Activity, ea 15 min ([id number]), and Gait Training, ea 15 min [id number])    Subjective:   Patient report: Pt expressing ongoing concerns for her (L) LE with the pain and edema, for which nursing is aware. She has a follow up visit with ortho MD this afternoon.     Shortness of breath or trouble breathing with:    Lying flat- no              Sitting at rest- no  Exertion- no     Patient with c/o 5/10 pain.     Objective:      Interventions this session      PT INTERVENTIONS: THEREX [id number]) : PROM/AAROM to LE's in all joint planes, open kinetic chain exercises, closed kinetic chain exercises: supine heel slides with overpressure to (L) knee to facilitate increased knee flexion, quad sets and SLR; seated hip abduction/adduction, seated LAQ, seated marching, ankle pumps, toe raises, and heel raises to [facility] reason : active, assisted, or passive range of motion for improved joint mobility. and resistance exercises that build muscle strength and endurance. THERACT ([id number]) : attending care plan meeting with res and interdisciplinary team to consult on res current status and anticipated POC, transfer training to increase functional task performance, facilitation of postural control,  techniques to facilitate body scheme/awareness, and ROM techniques to increase functional task performance and to receive training and education on promoting improved sequencing and coordination techniques., and GAIT TX ([id number]) pre-gait : weight shift during mobility GAIT TX ([id number]) : training in correct hand/foot placement during gait, training in correct sequencing of gait with AD to increase safety, weight shift during mobility, and with emphasis on increasing safety and performance within pts room GAIT TX reason : educate and train on effective sequencing and completion of ambulation around obstacles and in open environments , necessitates reeducation in ambulation after suffering musculoskeletal trauma, such as fractures or injuries., instruction on using mobility aids like walkers, crutches, or canes., and Instructing caregivers on appropriate guarding and assistive techniques to support patients during ambulation. for training patients whose walking abilities have been impaired by neurological, muscular, or skeletal abnormalities or trauma.       Functional Ability     Sitting balance:  Normal     Sit - stand:  SBA     Stand balance:  Fair     Bed - Chair/ Chair - bed:  SBA    Ambulation     Ambulation surface:  Ambulation surfaces : Level     Ambulation DistanceAmbulation Distance : in-room multiple laps equating to 134ft.      Ambulation ability:  SBA     Assistive devices:  Ambulation Assistive Device: Rolling  walker     Ability to maintain weight bearing status:  Fair     Ambulation tolerance:  Fair     Comments: Continues to express concern for placing weight onto (L) LE.   Patient Fall Risk Reduction and Safety Strategies: Call bell in reach.      Assessment:   Pt is being seen 5x/wk in SNU for rehab following a hospital stay for (L) hip fracture repair resulting in overall mobility decline as needed for home mngt.  Pt to be participating in daily gait training, transfer/mobility training,  strengthening/ROM and endurance & balance training.  Pt is anticipated to progress toward POC goals. She is impacted at this time by pain to (L) LE that limits activity tolerance and effectiveness of PT interventions due to pt reaction to pain.    Plan:   Continue to follow according to established plan of care.    Therapist:   Ulis Rias, PT,  03/27/2022

## 2022-03-27 NOTE — CAA (Care Area Assessment) (Signed)
Care Area Assessment  Date: March 27, 2022 Time: 11:20    Patient Name: Sheri Garza  Medical Record Number: [id number]  Date of Birth: 02/04/1935  Sex: female  Room/Bed: 220/A  Payor Info: Payor: MEDICARE / Plan: MEDICARE PART A AND B / Product Type: Medicare /       Review of Indicators of Visual Function    Diseases and conditions of the eye (diagnosis OR signs/symptoms present) Supporting Documentation   Cataracts, Glaucoma, or Macular Degeneration (I6500)      [1] Yes       Diabetic retinopathy (I2900)     [0] No       Blindness (B1000 = 3 or 4)     Vision: [0] Adequate     Decreased visual acuity (B1000, B1200 = 1)    Corrective Lenses: [1] Yes       Visual field deficit (B1200 = 1)    Eye pain (J0800)        Blurred vision    Double vision    Sudden loss of vision    Itching/burning eye    Indications of eye infection (I8000)      Diseases and conditions that can cause visual disturbances Supporting Documentation   Cerebrovascular accident or transient ischemic attack (I4500)     CVA or TIA: [0] No       Alzheimer's Disease and other dementias (I4200, I4800)     Alzheimer's Disease: [0] No     Myasthenia gravis (I8000, clinical record)    Multiple sclerosis (I5200)     Multiple Sclerosis: [0] No       Cerebral palsy (I4400)     Cerebral Palsy: [0] No           Mood (I5800, I5900, I5950, I6000, I6100, D0300, or D0600) or anxiety disorder (I5700)     Depression: [0] No      Manic Depression (Bipolar Disorder): [0] No      Psychotic Disorder: [0] No      Schizophrenia: [0] No      PTSD: [0] No             Anxiety Disorder: [0] No             Traumatic brain injury [id number])     Traumatic Brain Injury: [0] No       Other (I8000)      Functional limitations related to vision problems from clinical record, resident and staff interviews, direct observation Supporting Documentation   Peripheral vision or other visual problem that impedes ability to eat, walk, or interact with others. (B1000 = 3 or 4)      Vision: [0]  Adequate       Ability to recognize staff limited by vision problem. (B1000 = 3 or 4)  Vision: [0] Adequate       Difficulty negotiating the environment due to vision problem (B1000 = 3 or 4)    Balance problems (G0300) exacerbated by vision problem. (B1000, B1200)     Vision: [0] Adequate      Corrective Lenses: [1] Yes       Participation in self-care limited by vision problem. (B1000)     Vision: [0] Adequate       Difficulty seeing television, reading material of interest, or participating in activities of interest because of vision problem. (B1000 = 2, 3, or 4)     Vision: [0] Adequate       Increased risk for fall due to vision problems  or due to bifocals or trifocals. (B1200 = 1)     Corrective Lenses: [1] Yes         Environment Supporting Documentation   Is resident's environment adapted to his or her unique needs, such as availability of large print books, high wattage reading lamp, night light, etc.?    Are there aspects of the facility's environment that should be altered to enhance vision, such as low-glare floors, low-glare tables and surfaces, large print signs marking rooms, etc.?            Medications that can impair vision Sport and exercise psychologist pharmacist review of medication regiment can be very helpful) Supporting Documentation   Narcotics    Antipsychotics [id number])           Antidepressants (N0410C)           Anticholinergics    Hypnotics (N0410D)           Other      Use of visual appliances (B1200) Supporting Documentation   Visual appliances used     Has glasses         Input from resident and/or family/representative regarding the care area.  (Questions/Comments/Concerns/Preferences/Suggestions)                        Analysis of Findings  Care Plan Considerations       Review indicators and supporting documentation, and draw conclusions  Document:  Description of the problem;  Causes and contributing factors, and  Risk factors related to the care area.   Care Plan    Yes Document reason(s) the care  plan will/will not be developed.       Review of medical records 3/4-03/20/2022.  Resident has macular degeneration and is at risk for changes in vision. Proceed to care plan. Refer to vision care plan.     Referral(s) to another discipline(s) is warranted (to whom and why): No        Information regarding the CAA transferred to the CAA Summary (Section V of the MDS)? _Yes__

## 2022-03-27 NOTE — Nurses Notes (Signed)
Resident returned to facility via personal vehicle with son. Resident returned with  a letter from Starleen Arms PA-C from [facility] [facility] [employer]. As follows: Due to medical reasons. Sheri Garza may weight bear with walker, participate in physical therapy and occupational therapy, shower, apply ice to hip, and she will follow up with Dr. Hale Bogus at out office in three weeks. If you have any questions, comments, concerns please contact our office. Dual skin check performed and resident had no new skin issues noted. Staples from Left hip removed at appt. Resident allowed to shower. BLE had ted hose in place and per son and her was told to place BLE ted hose on in am and remove at HS. Notified PA Merleen Nicely. Rodena Medin, LPN

## 2022-03-27 NOTE — CAA (Care Area Assessment) (Signed)
Care Area Assessment  Date: March 27, 2022 Time: 11:27    Patient Name: Sheri Garza  Medical Record Number: [id number]  Date of Birth: 07/23/1935  Sex: female  Room/Bed: 220/A  Payor Info: Payor: MEDICARE / Plan: MEDICARE PART A AND B / Product Type: Medicare /     Review of Indicators of Oral/Dental Condition/Problem:    Cognitive problems that contribute to oral/dental problems Supporting Documentation   Needs reminders to clean teeth    Cannot remember steps to complete oral hygiene    Decreased ability to understand others (B0800) or to perform tasks following demonstration       [0] Understands     Cognitive deficit (C0500, C0700-C1000)      BIMS Score: 15         Functional impairment limiting ability to perform personal hygiene Supporting Documentation   Loss of voluntary arm movement (G0400A)           Impaired hand dexterity (G0400A)          Functional limitation in upper extremity range of motion (G0400A)            Decreased mobility (G0110)    Resists assistance with activities of daily living (E0800)    Rejection of Care: [1] Behavior of this type occurred 1 to 3 days     Lacks motivation or knowledge regarding adequate oral hygiene, dental care    Requires adaptive equipment for oral hygiene      Dry mouth causing buildup of oral bacteria Supporting Documentation   Dehydration (see Dehydration/Fluid Maintenance CAA)    Medications (from MDS and medication administration record)  Antipsychotics (N0410A)  Antidepressants (N0410C)  Antianxiety agents (N0410B)  Sedatives/hypnotics (N0410D)  Diuretics (N0410G)  Antihypertensives  Antiparkinsons medications  Narcotics  Anticonvulsants  Antihistamines  Decongestants  Antiemetics                                         Antineoplastics      Diseases and conditions that may be related to poor oral hygiene, oral infection Supporting Documentation   Recurrent pneumonia related to aspiration of saliva contaminated due to poor oral hygiene. 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  (Questions/Comments/Concerns/Preferences/Suggestions)        Analysis of Findings  Care Plan Considerations       Review indicators and supporting documentation, and draw conclusions  Document:  Description of the problem;  Causes and contributing factors, and  Risk factors related to the care area.   Care Plan      Yes   Document reason(s) the care plan will/will not be developed.       Review of medical records 3/4-3/10/2022  Resident has obvious or likely cavity or broken natural teeth. Proceed to care plan. Refer to care plan.     Referral(s) to another discipline(s) is warranted (to whom and  why): No        Information regarding the CAA transferred to the CAA Summary (Section V of the MDS)? Yes___

## 2022-03-27 NOTE — Progress Notes (Signed)
Sheri Garza presents to the office s/p open reduction internal fixation of left intertrochanteric hip fracture with intramedullary device done by Dr. Hale Bogus on March 04, 2022. She reports pain a 10/10. She has been weight-bearing as tolerated with a walker.  She is currently at a rehab facility.  She states she has been having lot a leg pain knee pain calf pain they did do a Doppler which was negative for blood clot.  She states physical therapy has been pushing on her knee and creating pain however when she does the exercises in her bed she has no discomfort    Physical Exam: Sheri Garza is a pleasant 87 y.o. female in no acute distress. Dressing was removed, sutures were removed. Incision is appears to be healing well with good reapproximation and shows no evidence of infection, separation, or keloid formation.  Gentle range of motion of the hip is without discomfort.  She is able to actively flex the hip to 100 without pain.  She is able to fully extend her left knee to 0 and flex to 110.  Crepitus is noted.  She does have mild swelling to the calf however it is soft does have +1 pitting edema      Imaging: X-rays were obtained of the left hip which show hardware in good position    Assessment and Plan: It was discussed with Sheri Garza that she is doing well. She will continue to be weight-bearing as tolerated with a walker.  She should continue physical therapy and occupational therapy hopefully they can transition home soon.  I did give her Tubigrip stockings to hopefully help with the swelling in her lower legs.  I told her that she can continue doing the exercises in her bed as she is obviously making strides with this there is no need for them to push her knee back any further as she has been 105-110 degrees.  She can ice the hip.Sheri Garza    She will return in 3 weeks to see Dr Hale Bogus at that time x-rays of her left hip pelvis we will be obtained weight-bearing. All of their questions were  answered to their satisfaction    This visit was rendered independently without a supervising/collaborating physician on site.

## 2022-03-27 NOTE — Nurses Notes (Signed)
Patient is alert and oriented. Lung sounds clear. Bowel sounds present and active. Takes medications whole in applesauce. Transfers with wheeled walker and staff assist x 1. Dressings to left hip are intact without redness, edema or drainage. Has edema to left ankle and foot. Side rails up x 2. Call bell and PO liquids within reach. Skilled for therapy for Contact 2 isolation. Isolation maintained for Norovirus, Yersinia enterocolitica, C-DIFF. Patient in private room with all services being provided in room.

## 2022-03-27 NOTE — CAA (Care Area Assessment) (Signed)
Care Area Assessment  Date: March 27, 2022 Time: 11:22    Patient Name: Sheri Garza  Medical Record Number: [id number]  Date of Birth: 10-02-1935  Sex: female  Room/Bed: 220/A  Payor Info: Payor: MEDICARE / Plan: MEDICARE PART A AND B / Product Type: Medicare /     Review of Indicators of ADLs - Functional Status/Rehabilitation Potential:    Possible underlying problems that may affect function. Some may be reversible. Supporting Documentation   Delirium (C1300) (clinical record and Delirium CAA)           Acute episode or flare-up of chronic condition (I8000, clinical record)   Encounter for other orthopedic aftercare       Changing cognitive status (C0100) (see Cognitive Loss CAA)      Mood decline (D0100) (clinical record and Mood State CAA)        Daily behavioral symptoms/decline in behavior (E0200) (see Behavioral Symptoms CAA)     Physical behavior directed towards others: [0] Behavior not exhibited      Verbal behavior towards others: [0] Behavior not exhibited      Other behavior symptoms: [0] Behavior not exhibited     Use of physical restraints (P0100) (See Physical Restraints CAA)          Pneumonia (I2000)     Pneumonia: [0] No       Fall (J1700) (from record and Falls CAA)        Hip fracture (I3900)       Hip Fracture: [1] Yes       Recent hospitalization (clinical record) (A1700, A1800 = 3 or 4)        Fluctuating ADLs (G0110A-J, G0120, G0300A-E, G0900) (observation, clinical record)    Nutritional problems [id number], [id number]) (clinical record and Nutrition CAA)        Pain (J0700) (See Pain CAA)        Dizziness    Communication problems (B0200, B0700. B0800) (clinical record and Communication CAA)   [0] Adequate      [0] Understood      [0] Understands       Vision problems (B1000) (observation, interview, clinical record, and Vision CAA)      Vision: [0] Adequate       Abnormal laboratory values (from clinical record) Supporting Documentation   Electrolytes      Complete blood count    Blood sugar       Thyroid function    Arterial blood gases            Other         Medications that can contribute to functional decline. Supporting Documentation   Psychoactive medications (N0410A-D)                                Other medications - ask consultant pharmacist to review medication regimen to identify these medications.      Limiting factors resulting in need for assistance with any of the ADLs (observation, interview, clinical record) Supporting Documentation   Mental errors such as sequencing problems, incomplete performance, or anxiety limitations.    Physical limitations such as weakness (G0110A-J.1 = 2, 3, or 4) (G0110A-J.2 = 2 or 3), limited range of motion (G0400A = 1 or 2) (G0400B = 1 or 2), poor coordination, poor balance (G0300A-E = 2), visual impairment (B1000 = 1-4), or pain (J0300 = 1, J0700 = 1)    Facility conditions such as  policies, rules, or physical layout.      Problems resident is at risk for because of functional decline (from observation, assessment, clinical record) Supporting Documentation   Falls (J1700)       Weight loss (K0300)      Weight loss: [0] No or unknown       Unidentified pain (J0700)        Social isolation    Restrain use (P0100)    Depression (D0100)                Depression: [id number] No       Complications of immobility, such as:  Pressure ulcers [id number])  Muscular atrophy  Contractures (G0400 A, B = 1 or 2)  Incontinence (H0300, H0400)  Urinary (I2300) and respiratory infections      Urinary Continence: [0] Always continent      Bowel Continence: [2] Frequently incontinent      UTI: [0] No            Input from resident and/or family/representative regarding the care area.  (Questions/Comments/Concerns/Preferences/Suggestions)        Analysis of Findings  Care Plan Considerations       Review indicators and supporting documentation, and draw conclusions  Document:  Description of the problem;  Causes and contributing factors, and  Risk factors related to the care area.   Care  Plan       Yes Document reason(s) the care plan will/will not be developed.       Review of medical records 3/4-3/10/2022.  Resident needs staff assistance with ADLs/functional abilities. Resident is receiving therapy. Proceed to care plan. Refer to care plan.     Referral(s) to another discipline(s) is warranted (to whom and why): No        Information regarding the CAA transferred to the CAA Summary (Section V of the MDS)? _Yes__    Where rehabilitation goals are envisioned, use of the ADL Supplement will help care planners to focus on those areas that might be improved, allowing them to choose from among a number of basic tasks in designated areas. Part 1 of the supplement can assist in the evaluation of all residents that trigger this care area. Part 2 of the supplement can be helpful for residents with rehabilitation potential (ADL Triggers A), to help plan a treatment program.     ADL SUPPLEMENT   (Attaining maximum possible Independence)   PART 1: ADL Problem Evaluation        INSTRUCTIONS: For those triggered - In areas physical help provided, indicate reason(s) for this help.          Dressing         Bathing         Toileting         Locomotion         Transfer         Eating   Mental Errors:   Sequencing problems, incomplete performance, anxiety limitations, etc.   Physical Limitations:   Weakness, limited range of motion, poor coordination, visual impairment, pain, etc.   Facility Conditions:   Policies, rules, physical layout, etc                   PART 2: Possible ADL Goals         Evaluation   INSTRUCTIONS: For those triggered - In areas physical help provided, indicate reason(s) for this help.     If wheelchair Yes/No: Yes Dressing Bathing Toileting  Locomotion Transfer Eating   For those considered for rehabilitation or decline   prevention treatment -   Indicate specific type of ADL activity that might require:   1. Maintenance to prevent decline.   2. Treatment to achieve highest practical  self-sufficiency (selecting ADL abilities that are just above those the resident can now perform or participate in).  Locates/ selects/ obtains clothes  Goes to tub/ shower  Goes to toilet (include commode/ urinal at night)  Walks in   room/   nearby   Yes Positions self in preparation  Opens/   pours/   unwraps/   cuts etc.     Grasps/puts on upper   lower body  Turns on water/ adjusts temperature  Removes/ opens clothes in preparation  Walks on unit   Yes Approaches chair/bed  Grasps   utensils and cups     Manages snaps, zippers, etc.  Lathers body (except back)  Transfers/ positions self  Walks throughout building (uses Media planner)   No Prepares chair/bed (locks pad, moves covers)  Scoops/   spears food (uses fingers when necessary)     Puts on in correct order  Rinses body  Eliminates into toilet  Walks outdoors   No Transfers (stands/sits/   lifts/turns)  Chews,   drinks, swallows     Grasps, removes each item  Dries with towel  Tears/uses paper to clean self  Walks on   uneven   surfaces   No Repositions/   arranges self  Repeats until food consumed     Replaces clothes   properly  Other Flushes Other  No Other Uses napkins, cleans self    Other  Adjusts   clothes, washes hands    Other

## 2022-03-28 ENCOUNTER — Other Ambulatory Visit: Payer: Self-pay

## 2022-03-28 DIAGNOSIS — R609 Edema, unspecified: Secondary | ICD-10-CM

## 2022-03-28 NOTE — Nurses Notes (Signed)
Alert and oriented. Patient skilled for therapy. Resident is in contact insolation 2 for Norovirus, Yersinia enterocolitica, C diff, in private, all services being provided in room. Patient takes meds whole in water and one pill in apple sauce. Ambulates with walker and standby assistance. Call light within reach.     Lutricia Feil, LPN

## 2022-03-28 NOTE — Nurses Notes (Signed)
Pt saw ortho doctor yesterday and staples were removed from left hip incision and dressing left off-orders received from [place] PA to leave incision OTA and no further dressings needed at this time-she will place orders.

## 2022-03-28 NOTE — PT Treatment (Signed)
[facility]  [address], [place] [id number]  Rehabilitation Services  Physical Therapy Treatment Note    Patient Name: Sheri Garza  Date of Birth: 1935-12-16  Height:  149.9 cm (4\' 11" )  Weight:  45.4 kg (100 lb)  Room/Bed: 220/A  Payor: MEDICARE / Plan: MEDICARE PART A AND B / Product Type: Medicare /     Date/Time of Admission: 03/17/2022  4:27 PM  Admitting Diagnosis:  Aftercare for healing traumatic closed fracture of left hip [S72.002D]    The patient was seen this session for: PT Individual Therapy Minutes: 60 total individual billable minutes    The patient was seen this session for 60 total billable minutes.    The following procedures were performed this session: Therapeutic Exercise, ea 15 min (97110), Therapeutic Activity, ea 15 min ([id number]), and Gait Training, ea 15 min [id number])    Subjective:   Patient report: Pt expresses that her appointment went well and she was happy to get staples out. She says she has been doing exercises in bed to help her (L) leg get stronger and have better flexibility. She continues to report pain to (L) knee and proximal LE.     Shortness of breath or trouble breathing with:   Lying flat- no   Sitting at rest- no  Exertion- no    Patient with c/o 3/10 pain.    Objective:      Interventions this session      [place] ([id number]) : PROM/AAROM to LE's in all joint planes, open kinetic chain exercises, closed kinetic chain exercises: supine heel slides with overpressure to (L) knee to facilitate increased knee flexion, quad sets and SLR; seated hip abduction/adduction, seated LAQ, seated marching, ankle pumps, toe raises, and heel raises to [facility] reason : active, assisted, or passive range of motion for improved joint mobility. and resistance exercises that build muscle strength and endurance. THERACT ([id number]) : attending care plan meeting with res and interdisciplinary team to consult on res current status and anticipated POC, transfer training to increase  functional task performance, facilitation of postural control, techniques to facilitate body scheme/awareness, and ROM techniques to increase functional task performance and to receive training and education on promoting improved sequencing and coordination techniques., and GAIT [place] ([id number]) pre-gait : weight shift during mobility GAIT [place] ([id number]) : training in correct hand/foot placement during gait, training in correct sequencing of gait with AD to increase safety, weight shift during mobility, and with emphasis on increasing safety and performance within pts room GAIT TX reason : educate and train on effective sequencing and completion of ambulation around obstacles and in open environments , necessitates reeducation in ambulation after suffering musculoskeletal trauma, such as fractures or injuries., instruction on using mobility aids like walkers, crutches, or canes., and Instructing caregivers on appropriate guarding and assistive techniques to support patients during ambulation. for training patients whose walking abilities have been impaired by neurological, muscular, or skeletal abnormalities or trauma        Range of Motion     RLE ROM WNL?  yes     LLE ROM WNL?  yes 0-110 degrees knee flexion.     Strength     RLE strength WNL?  yes  MMT : 4     LLE strength WNL?  no   MMT : 3+    Functional Ability     Sitting balance:  Normal     Sit - stand:  Standby assist     Stand balance:  Fair     Bed - Chair/ Chair - bed:  Standby assist     Comments:  Improving use of (L) LE with reduced amount of favoring/guarding to prevent using it for WB    Ambulation     Ambulation surface:  Ambulation surfaces : Level     Ambulation DistanceAmbulation Distance : : multiple laps of ambulation in room due to being in quarantine.     Ambulation ability:  Standby Marine scientist:  Ambulation Assistive Device: Rolling walker     Ability to maintain weight bearing status:  Fair     Ambulation tolerance:  Fair      Comments:     Stairs    Ability to manage stairs:  Contact guard assist    Comments:  step up/down 3x10 with initial cues for task sequencing.   Patient Fall Risk Reduction and Safety Strategies: Call bell in reach.   Assessment:   Pt is being seen 5x/wk in SNU for rehab following a hospital stay for (L) hip fracture repair resulting in overall mobility decline as needed for home mngt.  Pt to be participating in daily gait training, transfer/mobility training, strengthening/ROM and endurance & balance training.  Pt is anticipated to progress toward POC goals. She is impacted at this time by pain to (L) LE that limits activity tolerance and effectiveness of PT interventions due to pt reaction to pain.   Plan:   Continue to follow according to established plan of care.    Therapist:   Ulis Rias, PT,  03/28/2022

## 2022-03-28 NOTE — Care Plan (Signed)
[facility]  Spiritual Care Note    Patient Name:  Sheri Garza  Date of Encounter:   03/28/2022    I initiated encounter for pt support. Together, pt and i processed her ongoing stay in the facility and the uncertainty of her recovery and where she will go next. Pt feels well supported by family, but expressed hopefulness that she will be able to return home instead of needing to be placed elsewhere. Pt engaged in life review, reflecting with joyfulness on her work history. Pt reflected a sense of confidence in herself and her ability to express her needs. Spiritual care remains available 24/7.      03/28/22 1541   Clinical Encounter Type   Reason for Visit General Spiritual Care Visit   Referral From Chaplain-initiated   Outlook Visit At This Time No   Visited With Patient   Patient Spiritual Encounters   Spiritual Needs/Issues Uncertainty   Spiritual/Coping Resources Beliefs helpful in coping;Beliefs in God/Sacred/Higher Purpose;Confidence;Hopefulness;Loved/supported by family;Sense of purpose/meaning   Coping Facilitated story telling;Explored emotions;Offered empathy;Provided supportive presence;Relationship Writer Provided Non-anxious presence   Information/Education Provided Spiritual Care scope of service   Spiritual Care outcomes with Patient   Spiritual/Emotional Processing  Connected to spiritual support    Patient Coping Coping more effectively   Coping/Psychosocial   Observed Emotional State calm;pleasant   Verbalized Emotional State hopefulness   Coping/Psychosocial Response Interventions   Plan Of Care Reviewed With patient   Trust Relationship/Rapport emotional support provided;empathic listening provided;thoughts/feelings acknowledged   Time of Encounters   Start Time 1511   Stop Time 1541   Duration (minutes) 30 Minutes     Etienne Mowers Suehs-Vassel, [place]  Pager: [phone]  Total Time of Encounter: 40 min.

## 2022-03-28 NOTE — Care Management Notes (Signed)
MSW met with pt today to follow up after her Ortho appointment yesterday. Pt reports that MD is satisfied with her healing. MSW attempted to pinpoint discharge plans with pt; MSW explained that discharging to [place] would take a lot of coordination with her family. Pt's [facility] had to [facility] after her appointment yesterday afternoon. MSW offered to have a conference call with pt and her family to discuss details. Pt stated she would rather talk with her family privately first. MSW will follow up with pt next week.     Dorna Bloom, MSW  [phone]

## 2022-03-28 NOTE — Nurses Notes (Signed)
Patient alert and oriented x 4. She ambulates to and from the bathroom with a walker and one assist. She repositions self in bed to prevent skin breakdown. Fluids are encouraged to prevent  dehydration. She takes her meds whole with applesauce. Her mood is pleasant. Call light within reach.

## 2022-03-28 NOTE — OT Treatment (Signed)
[facility]  Rehabilitation Services  Occupational Therapy Treatment Note    Patient Name: Sheri Garza  Date of Birth: [birth year]  Height:  149.9 cm (4\' 11" )  Weight:  45.4 kg (100 lb)  Room/Bed: 220/A  Payor: MEDICARE / Plan: MEDICARE PART A AND B / Product Type: Medicare /     Date/Time of Admission: 03/17/2022  4:27 PM  Admitting Diagnosis:  Aftercare for healing traumatic closed fracture of left hip [S72.002D]    The patient was seen this session for OT Individual Therapy Minutes: 53 total billable minutes.  The following procedures were performed this session OT Treatment, ea 15 min ([id number]).      Subjective:     Patient with c/o LLE pain; however did not rate. RN aware. Pt states "I am so grateful for all of you guys helping me!"      Objective:   Pt supine in bed upon OT arrival. Pt agreeable to participate. CNA present at beginning of session.   OT TX ([id number]): Pt completed functional bed mobility from supine > sit EOB, and STS transfers with SBA and FWW with cues for safety. Pt then completed functional ambulation into r/r and requested to toilet. Pt completed stand > sit transfer on toilet with grab bar and FWW, and completed toileting routine. Pt completed perineal hygiene, and clothing management with SBA and continent of urine and BM on toilet. Pt then transferred to w/c to complete remainder of ADL routine. Pt completed UB bathing with SUA, and LB bathing with Mod A for BLE. Pt then completed UB dressing to doff/don shirt with SUA, and LB dressing tasks with Min A to doff/don socks, pants, and underwear to manage clothes over B feet. Pt completed standing grooming tasks at sink with FWW and SBA including hand hygiene, face washing, and oral care. Pt provided education on safety techniques and strategies for FWW management t/o session with pt demonstrating fair understanding. Pt then completed functional ambulation from r/r back to EOB with FWW and SBA, and completed  stand > sit transfer to sit EOB with SBA and FWW. Lastly, pt completed functional bed mobility with Min A for B LE management, and cues to obtain midline position.   At end of session call bell and phone within reach, heels off-loaded, with pt resting comfortably supine in bed.   Shortness of breath or trouble breathing with exertion (e.g. Walking, bathing, transferring): no  Shortness of breath or trouble breathing when sitting at rest: no  Shortness of breath or trouble breathing when lying flat:  no    Assessment:   Pt participating well in OT and is making progress towards goals in POC. Pt is demonstrating increased participation and independence in ADL routine. OT provided pt with education t/o session to improve safety and performance in ADLs and decrease fall risk with functional activities. Pt required 1 seated rest break t/o ADL session in r/r secondary to fatigue, however pt demonstrates ability to complete all functional activity demands of OOB ADL routine.     Plan:   Continue to follow according to established plan of care.    The risks/benefits of therapy have been discussed with the patient/caregiver and he/she is in agreement with the established plan of care.     Therapist:   Shanon Brow, OT,  03/28/2022

## 2022-03-29 ENCOUNTER — Other Ambulatory Visit: Payer: Self-pay

## 2022-03-29 DIAGNOSIS — S7292XA Unspecified fracture of left femur, initial encounter for closed fracture: Secondary | ICD-10-CM

## 2022-03-29 DIAGNOSIS — A046 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: Secondary | ICD-10-CM

## 2022-03-29 DIAGNOSIS — X58XXXA Exposure to other specified factors, initial encounter: Secondary | ICD-10-CM

## 2022-03-29 NOTE — Nurses Notes (Signed)
Alert and oriented. Patient skilled for therapy. Resident is in contact insolation 2 for Norovirus, Yersinia enterocolitica, C diff, in private, all services being provided in room. Patient takes meds whole in water and one pill in apple sauce. Patient had ADL care. Ambulates with walker and standby assistance. Call light within reach.     Lutricia Feil, LPN

## 2022-03-29 NOTE — Nurses Notes (Signed)
Patient is alert and oriented. Lung sounds clear. Bowel sounds present and active. Incision lines to left hip are intact without redness, edema or drainage. Skilled for therapy. Resident is in contact 2 isolation for Norovirus, Yersinia enterocolitica and C-DIFF. Patient is in private room and all services are being provided in room. Side rails up x 2. Call bell and PO fluids within reach. Ambulates with walker and standby assist X 1. Contact isolation maintained.

## 2022-03-29 NOTE — Progress Notes (Signed)
[facility] [employer]   Skilled Nursing Facility Note    Location: [facility]  Note type: follow up    PATIENT NAME: Sheri Garza  MRN: [id number]  DOB: May 07, 1935  Date of Service: 03/29/2022  CC: No chief complaint on file.        SUBJECTIVE/HPI: Sheri Garza is a 87 y.o. female being seen today for follow up. She was last evaluated on 3/10 for diarrhea at which time stool biofire was positive for yersinia enterocolitica and norovirus. She has been on bactrim. Today, patient reports that diarrhea has completely resolved and she is now having formed stool. No abdominal pain or vomiting. Her appetite is not very good but she is taking appropriate PO fluids.   Patient was seen by ortho on 3/14 and was told her surgical site is healing well. Pain in her hip/leg is improving.       Past Medical History:  Patient Active Problem List    Diagnosis Date Noted    Swelling of left lower extremity 03/19/2022    On deep vein thrombosis (DVT) prophylaxis 03/19/2022    Edema of left lower extremity 03/19/2022    Orthopedic aftercare 03/18/2022    At risk for malnutrition 03/18/2022    Left hip pain 03/18/2022    Acute blood loss as cause of postoperative anemia 03/17/2022    Atrophy of muscle of multiple sites 03/17/2022    Physical deconditioning 03/17/2022    Aftercare for healing traumatic closed fracture of left hip 03/03/2022    Back pain     DDD (degenerative disc disease), cervical     Insomnia     Kyphoscoliosis and scoliosis     Lumbar spinal stenosis     Osteoarthrosis     Avitaminosis D 09/14/2018    Thyroid nodule 04/01/2016    Spinal stenosis 04/01/2016    Hyperlipidemia 04/01/2016    Right Knee OA 09/21/2012       Past Surgical History:  Past Surgical History:   Procedure Laterality Date    HX CATARACT REMOVAL Left     HX CHOLECYSTECTOMY      HX COLONOSCOPY      HX DILATION AND CURETTAGE      HX OTHER      trigger finger, repair quad    HX TONSILLECTOMY      HX WISDOM TEETH EXTRACTION             Family  History:  Family Medical History:       Problem Relation (Age of Onset)    Cancer Other, Father, Brother    Diabetes Other, Mother    HTN <20 y.o. Other    Heart Disease Mother              Social History:  Social History     Tobacco Use    Smoking status: Former    Smokeless tobacco: Never   Media planner    Vaping status: Never Used   Substance Use Topics    Alcohol use: No    Drug use: No       Medications:    Current Facility-Administered Medications:     acetaminophen (TYLENOL) tablet, 650 mg, Oral, Q4H PRN, Leadman, Kelsey M, PA-C, 650 mg at 03/20/22 1050    aspirin (ECOTRIN) enteric coated tablet 81 mg, 81 mg, Oral, 2x/day, Leadman, Kelsey M, PA-C, 81 mg at 03/29/22 0919    Carboxymethylcell-Glycerin(PF) 0.5-0.9 % Dropperette 1 Drop, 1 Drop, Ophthalmic, 5x/day, Leadman, Kelsey M, PA-C, 1 Drop  at 03/29/22 1124    dextromethorphan-guaiFENesin (ROBITUSSIN DM) 10-100mg  per 78mL oral liquid, 10 mL, Oral, Q4H PRN, Leadman, Kelsey M, PA-C    magnesium hydroxide (MILK OF MAGNESIA) 400mg  per 65mL oral liquid, 15 mL, Oral, 4x/day PRN, Leadman, Kelsey M, PA-C    melatonin tablet, 3 mg, Oral, HS PRN - MR x 1, Leadman, Kelsey M, PA-C, 3 mg at 03/20/22 2106    multivitamin (PRESERVISION AREDS 2) with minerals capsule, 1 Capsule, Oral, 2x/day-Food, Heloise Beecham, Kelsey M, PA-C, 1 Capsule at 03/29/22 0927    ondansetron (ZOFRAN ODT) rapid dissolve tablet, 4 mg, Oral, Q6H PRN, Leadman, Kelsey M, PA-C    sennosides-docusate sodium (SENOKOT-S) 8.6-50mg  per tablet, 1 Tablet, Oral, 2x/day PRN, Heloise Beecham, Kelsey M, PA-C    trimethoprim-sulfamethoxazole (BACTRIM) 80-400mg  per tablet, 1 Tablet, Oral, 2x/day, Elgie Congo, APRN,ANP-BC, 80 mg at 03/29/22 0919        Allergies:  Allergies   Allergen Reactions    Antihistamine [Diphenhydramine Hcl] Swelling    Penicillins      Rash, Dizziness    Vancomycin  Other Adverse Reaction (Add comment)     Phlebitis    Flagyl [Metronidazole]     Gentamicin     Iodine And Iodide Containing Products      Lidocaine      Except xylocaine. [place] taking that    Other      Antihistamines and all caines, Pt states Xylocaine is ok    Tetracycline        OBJECTIVE    Vitals:    03/27/22 1500 03/28/22 0543 03/28/22 1540 03/29/22 0600   BP: 122/68 130/72 114/65 114/63   Pulse: 94 87 98 82   Resp: 17 18 16 20    Temp: 36.4 C (97.5 F) 36.6 C (97.9 F) 36.7 C (98.1 F) 36.6 C (97.9 F)   SpO2: 95% 94% 97% 94%   Weight:       Height:       BMI:           PHYSICAL EXAMINATION:   I have reviewed all vital signs.  General: Chronically ill appearing female, appears stated age, in no acute distress.  Eyes: Pupils equal and round. Conjunctiva clear.  HENT: Mucous membranes moist.  Lungs: Clear to auscultation bilaterally with no wheezes or crackles.   Cardiovascular: Regular rate and rhythm, no murmur.  Abdomen: Soft, non-tender, bowel sounds present, non-distended.  Extremities: Extremities normal, atraumatic, no edema.  Skin: L hip incision appears well healing, no erythema or drainage, staples removed.  Neurologic: Alert and oriented x3. No gross focal deficits.      ASSESSMENT AND PLAN:      Sheri Garza is a 87 y.o. female seen today for follow up.    Acute diarrhea 2/2 yersinia enterocolitica and norovirus  - Prescribed bactrim on 03/22/22 for 14 day course. Tolerating well. Continue.  - Will obtain CBC and BMP on 3/18 for monitoring of creatinine, potassium, and cell counts (prolonged bactrim course).  - Diarrhea now resolved.     L intertrochanteric fracture s/p ORIF  - Followed up by ortho on 3/14 with staples removed.  - Continue asa x 21 days (end 3/21).  - Continue tylenol PRN for pain.  - PT/OT.      Sheri Donna, MD     03/29/2022 10:49  Hospitalist, Internal Medicine

## 2022-03-30 ENCOUNTER — Other Ambulatory Visit: Payer: Self-pay

## 2022-03-30 NOTE — Nurses Notes (Signed)
Pt alert, oriented and able to make needs known to staff. Regular diet and takes medication whole in applesauce. Transfers with front wheel walker and assist x1. TTWB LLE. Continues to participate and tolerate therapy services. Denies SOB on RA. Denies pain or discomfort this shift. LBM 3/17. Contact II precautions maintained and all services provided in pts private room. Denies adverse reactions to po antibiotic treatment course. Call light and po fluid within reach.  Elta Guadeloupe, LPN

## 2022-03-30 NOTE — Nurses Notes (Signed)
Sheri Garza is alert and oriented. She is on contact 2 isolation for Norovirus, Yersinnia enterocolitica and c-diff, she remains in a private room with all services being provided in room. She has been up to the bathroom with standby assist and her walker. Her gait is slow but steady, she has stated that she has not been resting well at night because of the frequent rounding per staff. Her call bell and po fluids are within reach, she has enjoyed a visit from her granddaughter tonight.

## 2022-03-31 ENCOUNTER — Other Ambulatory Visit: Payer: Self-pay

## 2022-03-31 DIAGNOSIS — Z0489 Encounter for examination and observation for other specified reasons: Secondary | ICD-10-CM

## 2022-03-31 LAB — CBC WITH DIFF
BASOPHIL #: <0.04 10*3/uL (ref <=0.20)
BASOPHIL %: 0.1 %
EOSINOPHIL #: 0.09 10*3/uL (ref <=0.50)
EOSINOPHIL %: 0.9 %
HCT: 39.7 % (ref 34.8–46.0)
HGB: 12.5 g/dL (ref 11.5–16.0)
IMMATURE GRANULOCYTE #: <0.04 10*3/uL (ref <0.10)
IMMATURE GRANULOCYTE %: 0.1 % (ref 0.0–1.0)
LYMPHOCYTE #: 1.26 10*3/uL (ref 1.00–4.80)
LYMPHOCYTE %: 12.7 %
MCH: 28 pg (ref 26.0–32.0)
MCHC: 31.5 g/dL (ref 31.0–35.5)
MCV: 89 fL (ref 78.0–100.0)
MONOCYTE #: 1.13 10*3/uL — ABNORMAL HIGH (ref 0.20–1.10)
MONOCYTE %: 11.3 %
MPV: 10.2 fL (ref 8.7–12.5)
NEUTROPHIL #: 7.46 10*3/uL (ref 1.50–7.70)
NEUTROPHIL %: 74.9 %
PLATELETS: 265 10*3/uL (ref 150–400)
RBC: 4.46 10*6/uL (ref 3.85–5.22)
RDW-CV: 13.5 % (ref 11.5–15.5)
WBC: 10 10*3/uL (ref 3.7–11.0)

## 2022-03-31 LAB — BASIC METABOLIC PANEL
ANION GAP: 6 mmol/L (ref 4–13)
BUN/CREA RATIO: 20 (ref 6–22)
BUN: 14 mg/dL (ref 8–25)
CALCIUM: 9.4 mg/dL (ref 8.5–10.2)
CHLORIDE: 104 mmol/L (ref 96–111)
CO2 TOTAL: 28 mmol/L (ref 22–32)
CREATININE: 0.7 mg/dL (ref 0.49–1.10)
ESTIMATED GFR: >60 mL/min/{1.73_m2} (ref >60)
GLUCOSE: 85 mg/dL (ref 65–125)
POTASSIUM: 4 mmol/L (ref 3.5–5.1)
SODIUM: 138 mmol/L (ref 136–145)

## 2022-03-31 NOTE — Nurses Notes (Signed)
Patient alert and oriented x 4. Takes meds whole with water. Ambulates with walker to bathroom. Call light in reach.

## 2022-03-31 NOTE — Progress Notes (Cosign Needed)
Interval Progress Note:     Patient's diarrhea has resolved. Reporting some nausea to nurse, no vomiting. Thinks it might be due to antibiotics.   Patient has completed 8 days of Bactrim for non-severe colitis due to Yersinia enterocolitica and also positive for Norovirus at that time.     Discontinue Bactrim.     Will touch base with Infection Control regarding when patient can come out of contact isolation.       Audie Pinto, PA-C

## 2022-03-31 NOTE — PT Treatment (Signed)
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 [A04.6]    Femur fracture, left (CMS HCC) [S72.92XA]    Swelling of left lower extremity [M79.89]    On deep vein thrombosis (DVT) prophylaxis [Z79.899]    Edema of left lower extremity [R60.0]    Orthopedic aftercare [Z47.89]    At risk for malnutrition [Z91.89]    Left hip pain [M25.552]    Acute blood loss as cause of postoperative anemia [D62]    Atrophy of muscle of multiple sites [M62.59]    Physical deconditioning [R53.81]    Aftercare for healing traumatic closed fracture of left hip [S72.002D]      Therapy Diagnosis Codes: M62.81 (muscle weakness), R26.89 (gait abnormality, and R26.81 (unsteadiness on feet)       Sheri Garza is a 87 y.o. female currently admitted for Aftercare for healing traumatic closed fracture of left hip [S72.002D]     Service Dates: 03/18/22  to 03/31/22    The patient was seen this session for:  PT Individual Therapy Minutes: 55 individual billable minutes    The patient was seen this session for 55 total billable minutes.    The following procedures were performed this session: Therapeutic Activity, ea 15 min ([id number]) and Gait Training, ea 15 min [id number])    Subjective:     Pt reports she can tell her leg has improved but complains of ongoing pain that limits her tolerance to fully use (L) LE. She states she is feeling ill today and that it began after taking her medications this and said she hadnt eaten breakfast.     Numeric pain scale:   5/10    Shortness of breath or trouble breathing with:    Lying flat- no    Sitting at rest- no  Exertion- no    Objective:      Cognition:  Person, Place, Time, and Situation     Communication ability:  good       Range of Motion     RLE ROM WNL?  yes     LLE ROM WNL?  yes and comments  110degree knee flexion.          Strength     RLE strength WNL?  yes  MMT : 4     LLE strength WNL?  no   MMT : 3+          Areas of functional mobility addressed:     Assistive devices used with bed mobility:  Bed rail     Rolling:  Independent     Supine - Sit:  Independent     Sit - Supine:  Independent     Sit - stand:  Standby assist     Bed - Chair/ Chair - bed:  Standby assist     Car transfers: NA     Balance while sitting:  Able to maintain position     Balance while standing: Partial physical support  Comments:  She continues to favor/guard full use of (L) LE due to c/o pain.           Ambulation     Ambulation surface:  Ambulation surfaces : Level     Ambulation DistanceAmbulation Distance : : up to 145ft. She has been confined for in-room treatment due to having norovirus, thus limiting her activity possibilities.      Ambulation ability:  Standby Marine scientist:  Ambulation Assistive Device: Rolling walker     Ability to maintain weight bearing status:  Fair     Ambulation tolerance:  Fair     Comments:       Stairs    Ability to manage stairs:  Contact guard assist    Comments:  step up/down 3x10 with initial cues for task sequencing.          PT Tests: 30 second Chair Rise, modified with UE support : 8                                       TUG : 22             2MWT : Not tested due to quarantined for in-room treatments                  Assessment:   Summary of Interventions provided: Res has undergone PT interventions of TE to enhance functional strength and endurance for basic mobility skills. TA to receive training and education on promoting improved sequencing and coordination techniques. Gait  training to educate and train on effective sequencing and completion of ambulation around obstacles and in open environments.     Progress and Response to treatment: PTPATIENTPROGRESS: anticipated improvement is attainable within current POC duration, patient's condition has potential to improve as a result of skilled rehab, and patient's condition is improving as a result of skilled therapy services    Continued PT services are necessary in order to REASONSTOCONTINUEPT: establish and instruct in compensatory strategies, assess functional abilities, facilitate (I) with all functional mobility, promote safety awareness, enhance rehab potential, increase awareness of environmental hazards, increase dynamic balance, increase LE strength and ROM, and minimize fall risk    Plan:   Goals:        Learning goals:  Exercise, Gait, Precaution/Safety, Balance activities, and Transfers/mobility          Physical therapy SHORT TERM goals 2 weeks:   1. Patient will exhibit bed mob supine<>sit to Standby assist in order to prepare for OOB activity, sitting EOB for ADLs and preparing for transfers/walking. GOAL MET: (I).  2. Patient will improve ability to safely and efficiently transfer to<>bed/chair with Standby assist with implementation of compensatory strategies and with recognition of safety hazards. GOAL MET: SBA  3. Patient will safely ambulate on Level Ambulation Distance: 257ft with Standby assist and Assistive Devices used: Front wheeled walker to exhibit increased efficiency and safety with lower risk of falls during daily functional tasks and increased (I) performance. CONTINUE: 150ft  4. Patient will negotiate 5 stairs with  Contact guard assist to prepare for RTN to home and community. CONTINUE: up down step stool   5. Patient will perform modified, with UE support, 30second Chair Rise: 7 to reveal enhanced functional strength, balance and activity tolerance to reduce risk of falls and improved mobility efficiency.  GOAL MET: SBA for 8  6.  Patient will decrease risk of falls and reveal enhanced coordination and functional mobility by improved score on TUG to: 38 sec. GOAL MET: SBA 22sec.  7. Patient will exhibit enhanced functional activity tolerance and mobility efficiency through 2MWT : 142ft. NT due to being quarantined in-room           Updated goals:     ST: 1. Patient will exhibit bed mob supine<>sit to Standby assist in order to prepare for OOB activity, sitting EOB for ADLs and preparing for transfers/walking. GOAL MET: (I).  2. Patient will improve ability to safely and efficiently transfer to<>bed/chair with Standby assist with implementation of compensatory strategies and with recognition of safety hazards. GOAL MET: SBA. Upgrade to SBA  3. Patient will safely ambulate on Level Ambulation Distance: 274ft with Standby assist and Assistive Devices used: Front wheeled walker to exhibit increased efficiency and safety with lower risk of falls during daily functional tasks and increased (I) performance. CONTINUE: 192ft   4. Patient will negotiate 5 stairs with  Contact guard assist to prepare for RTN to home and community. CONTINUE: up down step stool.   5. Patient will perform modified, with UE support, 30second Chair Rise: 7 to reveal enhanced functional strength, balance and activity tolerance to reduce risk of falls and improved mobility efficiency. GOAL MET: SBA for 8  6. Patient will decrease risk of falls and reveal enhanced coordination and functional mobility by improved score on TUG to: 38 sec. GOAL MET: SBA 22sec.  7. Patient will exhibit enhanced functional activity tolerance and mobility efficiency through 2MWT : 122ft. NT due to being quarantined in-room       LT: CONTINUE    Additional information:     Patient and/or family Goals/Expectations: Realistic     Anticipated rehab needs at discharge:  Home health services     Patient has been advised of PT diagnosis, goals and plan:  Yes     Patient/family has  given verbal consent for eval, treatment: and plan of care Yes    Is the patient appropriate for skilled PT at this time?  yes    Therapist:   Ulis Rias, PT,  03/31/2022

## 2022-03-31 NOTE — Nurses Notes (Signed)
Pt alert & oriented-pleasant & cooperative-skilled for therapy-pt in contact isolation 2 for Norovirus, Yersinia enterocolitica, Cdiff-pt in private room & all services being provided in room-takes meds whole without difficulty & some with applesauce-Bactrim d/c'd this shift-on regular diet-working with therapy-ambulates with walker & SBA-WBAT left leg-left hip incisions OTA-denied any pain this shift-fall/skin/contact 2 isolation  precautions maintained-hourly rounding performed to meet patients needs & for patients safety-vs 36.2 101 18 103/50 93% RA-call bell in reach.

## 2022-03-31 NOTE — OT Treatment (Signed)
[facility]  Rehabilitation Services  Occupational Therapy Treatment Note    Patient Name: Sheri Garza  Date of Birth: [birth year]  Height:  149.9 cm (4\' 11" )  Weight:  43.1 kg (94 lb 14.5 oz)  Room/Bed: 220/A  Payor: MEDICARE / Plan: MEDICARE PART A AND B / Product Type: Medicare /     Date/Time of Admission: 03/17/2022  4:27 PM  Admitting Diagnosis:  Aftercare for healing traumatic closed fracture of left hip [S72.002D]    The patient was seen this session for OT Individual Therapy Minutes: 33 total billable minutes.  The following procedures were performed this session OT Treatment, ea 15 min ([id number]).      Subjective:     Patient with c/o LLE pain, however did not rate formally.      Objective:   Pt supine in bed upon OT arrival to room. Pt agreeable to participate in OT session.   OT [place] ([id number]): Pt completed functional bed mobility from supine > sit EOB. Pt then completed UB dressing to doff/don shirt with SUA, and LB dressing tasks with Min A to don socks, pants, and underwear to manage clothes over B feet. Pt completed STS transfers with SBA and FWW with cues for safety. Pt then completed functional ambulation into r/r and requested to toilet. Pt completed stand > sit transfer on toilet with grab bar and FWW, and completed toileting routine. Pt completed perineal hygiene, and clothing management with SBA and continent of urine and BM on toilet. Pt then took a few steps to sink to complete standing grooming tasks.  Pt completed standing grooming tasks at sink with FWW and SBA including hand hygiene and oral care. Pt provided education on safety techniques and strategies for FWW management t/o session with pt demonstrating increased understanding. Pt then completed functional ambulation from r/r back to EOB with FWW and SBA, and completed stand > sit transfer to sit EOB with SBA and FWW. Lastly, pt completed functional bed mobility with Min A for B LE management, and cues to  obtain midline position.   At end of session call bell and phone within reach, with pt resting comfortably supine in bed.    Shortness of breath or trouble breathing with exertion (e.g. Walking, bathing, transferring): no  Shortness of breath or trouble breathing when sitting at rest: no  Shortness of breath or trouble breathing when lying flat:  no    Assessment:   Pt participating well and progressing towards goals in POC. Pt demonstrating increased functional activity tolerance with ADL routine, and is demonstrating increased independence and participation in ADLs. Pt provided with education t/o to maximize safety and participation, with pt demonstrating increased understanding this date.     Plan:   Continue to follow according to established plan of care.    The risks/benefits of therapy have been discussed with the patient/caregiver and he/she is in agreement with the established plan of care.     Therapist:   Shanon Brow, OT,  03/31/2022    \

## 2022-04-01 ENCOUNTER — Other Ambulatory Visit: Payer: Self-pay

## 2022-04-01 NOTE — Care Plan (Signed)
[facility]  [place]  Speech Therapy Progress Note    Patient Name: Sheri Garza  Date of Birth: 02/17/1935  Weight:  43.1 kg (94 lb 14.5 oz)  Room/Bed: 220/A  Payor: MEDICARE / Plan: MEDICARE PART A AND B / Product Type: Medicare /      Date/Time of Admission: 03/17/2022  4:27 PM  Admitting Diagnosis:  Aftercare for healing traumatic closed fracture of left hip [S72.002D]        Assessment:  Cognitive communication skills improving    Plan: ST to continue POC to address cognitive communication skills    Subjective:  Flowsheet Info:     04/01/22 1641   Therapist Pager   SLP Pager Debe Coder [phone]   Rehab Session   Document Type therapy progress note (daily note)   Total SLP Minutes: 20   Patient Effort good   General Information   Patient Profile Reviewed yes     Pt was awake, alert, upright in bed and breathing on room air comfortably    Objective:  Pt participated with skilled interventions for working memory, attention, and thought organization. Pt was able to sustain attention during verbal reasoning tasks and while engaging in conversation for +5 min increments with min verbal cues for redirection. Good thought organization while sequencing daily events and demonstrating topic maintenance with 80% accuracy. Pt was able to recall functional information and upcoming events with 75-80% accuracy with min verbal cues to increase schedule management skills. Pt was left in good condition with call light and bedside table within reach.      Therapist:  Sandrea Hammond, ST   Pager #: [phone]  Phone #: [phone] Treatment Time: 20 minutes

## 2022-04-01 NOTE — Nurses Notes (Signed)
Patient alert and oriented. Patient skilled for therapy. Ambulates with walker and standby assistance. Denies pain this shift. Patient has call light within reach.     Lutricia Feil, LPN

## 2022-04-01 NOTE — Nurses Notes (Signed)
Patient went to doctors appointment with neighbor Olene Floss. Transferred in personal vehicle.     Lutricia Feil, LPN

## 2022-04-01 NOTE — Care Plan (Signed)
04/01/22 1440   Therapist Pager   SLP Pager Taja [place] [phone]   Rehab Session   Document Type rehab contact note   Total SLP Minutes: 0   General Information   Patient Profile Reviewed yes     ST intervention deferred at this time; Pt is out of the facility for an apt. ST will re-attempt at later time today.    [place]

## 2022-04-01 NOTE — PT Treatment (Addendum)
[facility]  [place], [place] [address]  Rehabilitation Services  Physical Therapy Treatment Note    Patient Name: Sheri Garza  Date of Birth: April 13, 1935  Height:  149.9 cm (4\' 11" )  Weight:  43.1 kg (94 lb 14.5 oz)  Room/Bed: 220/A  Payor: MEDICARE / Plan: MEDICARE PART A AND B / Product Type: Medicare /     Date/Time of Admission: 03/17/2022  4:27 PM  Admitting Diagnosis:  Aftercare for healing traumatic closed fracture of left hip [S72.002D]    The patient was seen this session for: PT Individual Therapy Minutes: 55 total individual billable minutes    The patient was seen this session for 55 total billable minutes.    The following procedures were performed this session: Gait Training, ea 15 min [id number]) Therapeutic Exercise, ea 15 min ([id number]) and Therapeutic Activity, ea 15 min ([id number])    Subjective:   Patient report: Pt is cleared from isolation and is happy to be allowed out of her room. She does report being concerned of getting too tired for her MD appointment this afternoon.     Shortness of breath or trouble breathing with:   Lying flat- no   Sitting at rest- no  Exertion- no    Patient with c/o 0/10 pain.    Objective:      Interventions this session     PT INTERVENTIONS: [place]ER[place] ([id number]) : dynamic balance activities during standing, bed mob training to improve functional skill (I), transfer training to increase functional task performance, and bending to increase dynamic balance skills and to receive training and education on promoting improved sequencing and coordination techniques. and GAIT [place] ([id number]) pre-gait : weight shift during mobility GAIT TX (34742) : to normalize gait pattern, challenging patient outside of BOS, training in correct sequencing of gait with AD to increase safety, to train on strategies for safely maneuvering around obstacles, with emphasis on negotiating obstacles, directional changes, and with emphasis on increasing safety and performance within the  facility GAIT TX reason : educate and train on effective sequencing and completion of ambulation around obstacles and in open environments , necessitates reeducation in ambulation after suffering musculoskeletal trauma, such as fractures or injuries., instruction on using mobility aids like walkers, crutches, or canes., and Instructing caregivers on appropriate guarding and assistive techniques to support patients during ambulation. for training patients whose walking abilities have been impaired by neurological, muscular, or skeletal abnormalities or trauma. THEREX ([id number]) : PROM/AAROM to LE's in all joint planes, open kinetic chain exercises, closed kinetic chain exercises: supine heel slides with overpressure to (L) knee to facilitate increased knee flexion, quad sets and SLR; seated hip abduction/adduction, seated LAQ, seated marching, ankle pumps, toe raises, and heel raises to [facility] reason : active, assisted, or passive range of motion for improved joint mobility. and resistance exercises that build muscle strength and endurance.     Functional Ability     Assistive devices used with bed mobility:  Bed rail     Rolling:  Independent     Supine - Sit:  Independent     Sit - Supine:  Independent     Sit - stand:  Standby assist     Bed - Chair/ Chair - bed:  Standby assist     Car transfers: NA     Balance while sitting:  Able to maintain position     Balance while standing: Partial physical support     Comments:  She continues to favor/guard full  use of (L) LE due to c/o pain.     Ambulation    Ambulation surface:  Ambulation surfaces : Level     Ambulation DistanceAmbulation Distance : : up to 269ft.      Ambulation ability:  Standby Marine scientist:  Ambulation Assistive Device: Rolling walker     Ability to maintain weight bearing status:  Fair     Ambulation tolerance:  Fair     Comments:   Patient Fall Risk Reduction and Safety Strategies: Call bell in reach.     Assessment:   Pt is being  seen 5x/wk in SNU for rehab following a hospital stay for (L) hip fracture repair resulting in overall mobility decline as needed for home mngt.  Pt to be participating in daily gait training, transfer/mobility training, strengthening/ROM and endurance & balance training.  Pt is anticipated to progress toward POC goals. She is impacted at this time by pain to (L) LE that limits activity tolerance and effectiveness of PT interventions due to pt reaction to pain.    Plan:   Continue to follow according to established plan of care.    Therapist:   Ulis Rias, PT,  04/01/2022

## 2022-04-01 NOTE — Care Management Notes (Signed)
[facility]  Care Management Note    Patient Name: Sheri Garza  Date of Birth: 18-Jun-1935  Sex: female  Date/Time of Admission: 03/17/2022  4:27 PM  Room/Bed: 220/A  Payor: MEDICARE / Plan: MEDICARE PART A AND B / Product Type: Medicare /    LOS: 15 days   Primary Care Providers:  [facility] ([facility])    Admitting Diagnosis:  Aftercare for healing traumatic closed fracture of left hip [S72.002D]    Assessment:      04/01/22 1501   Assessment Details   Assessment Type Continued Assessment   Date of Care Management Update 04/01/22   Date of Next DCP Update 04/03/22   Care Management Plan   Discharge Planning Status plan in progress   Projected Discharge Date 04/05/22   Discharge plan discussed with: Patient   Discharge Needs Assessment   Equipment Needed After Discharge commode;hospital bed;other (see comments)  (walker, front wheeled)   Discharge Facility/Level of Care Needs Home with Home Health and DME (code 6)   Transportation Available family or friend will provide     MSW received a phone call from pt's son this morning stating that the family has had a discussion and they would prefer if pt would discharge on Saturday 3/23, as opposed to Wednesday 3/27. Family's plan is for pt to discharge to [place] and stay for 2 weeks. They say that pt will return to her home in [place] around 04/21/22. They would like for [facility] to be arranged for when she returns to [place], they do not want [facility] set up for the time in [place]. They would also like for the following DME: a hospital bed, BSC, and FWW.     Discharge Plan:  Home with Home Health and DME (code 6)  MSW spoke with pt who is agreeable to discharge date and plan. MSW placed orders in Epic for hospital bed, FWW, BSC and HH for PT and OT. FOC form was signed today. Pt would like to use [place] for her DME and [place] for her [facility] agency. MSW placed referrals in [facility]. MSW will continue to follow.      The patient will continue to be evaluated for developing discharge needs.     Case Manager: Dorna Bloom, [place]  Phone: [phone]

## 2022-04-01 NOTE — OT Treatment (Signed)
[facility]  Occupational Therapy Weekly/Progress Note    Patient Name: Sheri Garza  Date of Birth: [birth year]  Height:  149.9 cm (4\' 11" )  Weight:  43.1 kg (94 lb 14.5 oz)  Room/Bed: 220/A  Payor: MEDICARE / Plan: MEDICARE PART A AND B / Product Type: Medicare /     Date/Time of Admission: 03/17/2022  4:27 PM  Admitting Diagnosis:  Aftercare for healing traumatic closed fracture of left hip [S72.002D]    The patient was seen this session for OT Individual Therapy Minutes: 39 total billable minutes.  The following procedures were performed this session OT Treatment, ea 15 min ([id number]).    Subjective:     Patient with c/o LLE pain, with no rating. Nurse aware. Pt states "Hopefully this Dr. Appointment goes smooth. I do not want to be late."       Objective:   Pt supine in supine in bed upon OT arrival to room. Pt agreeable to OT session this date.   OT [place] ([id number]): Pt completed functional bed mobility from supine > sit EOB. Pt completed STS transfers with SBA and FWW with cues for safety. Pt then completed functional ambulation into r/r and requested to toilet. Pt completed stand > sit transfer on toilet with grab bar and FWW, and completed toileting routine. Pt completed perineal hygiene, and clothing management with SBA and continent of urine on toilet with BSC overtop. Pt then took a few steps to sink to complete standing grooming tasks.  Pt completed standing grooming tasks at sink with FWW and SBA including hand hygiene and oral care. Pt completed functional ambulation back into room, where pt completed UB dressing in standing to doff/don shirt with SUA, and LB dressing tasks in sitting with Min A for managing clothing over B feet to don socks and pants. Lastly, pt completed functional bed mobility with Min A for B LE management, and cues to obtain midline position. OT provided pt education on safety techniques and strategies for FWW management t/o  session with pt demonstrating increased understanding.   At end of session call bell and phone within reach, with pt resting comfortably supine in bed.  Precautions: fall, WBAT on LLE   Activities of Daily Living (ADL's):        Grooming/Oral Hygiene:  SBA       Feeding: SUA        UB bathing: SUA-Min A        UB dressing:SUA       LB bathing: Min A        LB dressing: Min A        Toileting:               Clothing management:SBA             Hygiene:SBA    Instrumental Activities of Daily Living (IADL's): Mod A   Balance: Fair + standing   Functional Transfers:      Toilet transfer:  SBA     Shower transfer: SBA     DME:   FWW and shower chair        Endurance: fair +  Therapeutic Exercise:  as per daily tx notes to increase UE strength in order to perform self care and functional mobility tasks.  Shortness of breath or trouble breathing with exertion (e.g. Walking, bathing, transferring): no  Shortness of breath or trouble breathing when sitting at rest: no  Shortness of breath  or trouble breathing when lying flat: no  Care Plan Goals   OT Rehab Goals Bathing Goal;Home Management Goal;LB Dressing Goal;Strength Goal   Bathing Goal   Bathing Goal, Date Established 03/18/22   Bathing Goal, Time to Achieve 30 days   Bathing Goal, Activity Type all bathing tasks   Bathing Goal, Independence Level modified independence GOAL NOT MET; REQUIRES SUA - MIN A   Home Management Goal   Home Mgmt Goal, Date Established 03/18/22   Home Mgmt Goal, Time to Achieve 30 days   Home Mgmt Goal, Activity Type Light housekeeping and small meal prep   Home Mgmt Goal, Independence Level modified independence GOAL NOT MET; REQUIRES MOD A   LB Dressing Goal   LB Dressing Goal, Date Established 03/18/22   LB Dressing Goal, Time to Achieve 30 days   LB Dressing Goal, Activity Type all lower body dressing tasks   LB Dressing Goal, Independence Level modified independence GOAL NOT MET; REQUIRES MIN A   Strength Goal   Strength Goal, Date  Established 03/18/22   Strength Goal, Time to Achieve 30 days   Strength Goal, Measure to Achieve BUEs 5/5 GOAL NOT MET; CURRENTLY ~ 4/5   Strength Goal, Functional Goal Increase BUE strength to facilitae indep with MRADLs with reduced WB tolerance on LLE        Assessment:   Pt has been seen daily in SNF for OT services following a hospital stay for a non-invasive procedure at an outside facility.  Pt has participated in ADL retraining, functional transfer and functional mobility training, home safety education, strength and endurance exercises.   Patient is making great progress towards established OT goals. Pt has progressed as follows functional transfers CGA > SBA, grooming CGA > SUA-SBA, and pt has greatly increased functional activity tolerance t/o OT sessions. Pt has been instructed on using leg lifter for functional bed mobility, and a FWW for all daily activities when mobile. Pt will continue to be evaluated for developing d/c needs.     Plan:   Continue to follow according to established plan of care.    The risks/benefits of therapy have been discussed with the patient/caregiver and he/she is in agreement with the established plan of care.     Therapist:   Shanon Brow, OT,  04/01/2022

## 2022-04-01 NOTE — Nurses Notes (Signed)
Patient is alert and oriented. Lung sounds clear. Bowel sounds present and active. Incision line intact to left hip without redness, edema or drainage and is open to air. Side rails up x 2. Call bell and PO fluids within reach. No c/o pain this shift. Skilled for therapy and is in contact 2 isolation for Norovirus, Yersinia enterocolitica, C DIFF. In private room and all services are being provided in room. Isolation precautions maintained.

## 2022-04-01 NOTE — Care Plan (Signed)
[facility] Skilled Nursing Unit [facility] SNU)  Weight Change Nutrition Analysis      Name: Adama Corea   Room/Bed: 220/A    Current Height:   Height: 149.9 cm (4\' 11" )  Current Weight: Weight: 43.1 kg (94 lb 14.5 oz)  Current BMI: Body mass index is 19.17 kg/m.    Weight Changes:   6 lb/ 5.1% weight change x 14 days  Weights(Reverse Chron) (last 90 days)       Date/Time Weight Weight Source Weight Source (NICU) % weight change Who    03/31/22 0900 43.1 kg (94 lb 14.5 oz) Standing -- -5.1 % BZ    03/17/22 1651 45.4 kg (100 lb) Bed -- -1 % MS          Diet and Supplement Order:  DIET REGULAR  MNT PROTOCOL FOR DIETITIAN  DIETARY ORAL SUPPLEMENTS Oral Supplements with tray: Ensure Plus High Protein- Vanilla; LUNCH; 1 Each   PO Intake:    Documented intake from the past x 1 week range from 25-100% with average documented intake 67%.   Additionally 1 snack documented - 100% consumed  2 supplement refusals per EMR    Medications and Labs:   Medications: acetaminophen, aspirin, carboxymethylcell-glycerin, Dextromethorphan-guaifenesin, magnesium hydroxide, melatonin, multivitamin with minerals, ondansetron, sennosides-docusate     Latest Reference Range & Units 03/31/22 05:07   SODIUM 136 - 145 mmol/L 138   POTASSIUM 3.5 - 5.1 mmol/L 4.0   CHLORIDE 96 - 111 mmol/L 104   CARBON DIOXIDE 22 - 32 mmol/L 28   BUN 8 - 25 mg/dL 14   CREATININE 0.49 - 1.10 mg/dL 0.70   GLUCOSE 65 - 125 mg/dL 85   ANION GAP 4 - 13 mmol/L 6   BUN/CREAT RATIO 6 - 22  20   ESTIMATED GLOMERULAR FILTRATION RATE >60 mL/min/1.43m^2 >60   CALCIUM 8.5 - 10.2 mg/dL 9.4     Comments:   Spoke with pt at bedside. Pt stated being eager for her appointment today and was hopeful it would go well. Pt reported feeling "better day" following not feeling well yesterday. Stated she vomited yesterday and has not been getting a lot of sleep lately. While pt stated previously experiencing diarrhea, stated that has not occurred today. Pt reported her appetite  to be poor, but despite that she stated she will try to make herself eat at meal times. Added that she believes her poor appetite to be attributed to all of the medications she has to take daily. Informed pt of her 6# wt loss. Pt stated she believed she had lost weight recently, but believed it to be more, approximately 14-15#. She added that due to her not feeling well may have impacted her weight change in addition to her poor appetite. She stated that she has always had a hard time with weight gain but it was always "easier" for her to lose weight.    Stated liking the ensure supplement but that it is "very filling." Pt stated a dislike of chicken and [place]. Informed pt that those foods are higher in protein and make up a portion of our menu choices. Offered option to pt of increasing supplement regimen to aid with increasing protein intake. Pt declined and stated that she believes she will not be able to drink two of those supplements per day.   Informed pt to opt for foods higher in protein/calories when pt does eat to optimize PO intake. Additionally informed/encouraged pt to drink her supplement in between meals  or in the evening to aid with increasing PO intake, as pt stated she will often drink it immediately after a meal. Pt stated likes of ice cream, jello, sherbet, oatmeal and pudding.Additionally stated a dislike of rice. Informed pt that family/loved ones/visitors can bring in food for her; pt stated that she did like "[employer]." Encouraged pt to eat well when possible and to reach out if she were to have any nutrition questions or concerns.    Non pitting edema documented to RLE, 1+ edema documented to LLE    Plan/Intervention:    1) Continue regular diet  - please encourage intake  - encourage family/loved ones to bring in snacks/meals for pt to optimize PO intake  2) Continue Ensure plus HP (vanilla) per pt request 1x/day  - pt declined additional supplements at this time  - please encourage intake  between meals of in the evening time to optimize PO intake  3) Continue multivitamin with minerals for micronutrient support  4) Monitor BM's for regularity.  - Last BM per flowsheet: 3/18   5) Continue weekly weights   6) Will monitor labs/wts/PO intake     Dietitian available prn      Cephus Shelling, RD  Phone [phone]

## 2022-04-01 NOTE — Nurses Notes (Signed)
Patient returned with neighbor Olene Floss in wheelchair to the [facility]. Patient transported back in personal vehicle. Patient returned at 62. Call light within reach.     Lutricia Feil, LPN

## 2022-04-02 ENCOUNTER — Other Ambulatory Visit: Payer: Self-pay

## 2022-04-02 NOTE — Care Plan (Signed)
[facility]  Spiritual Care Note    Patient Name:  Sheri Garza  Date of Encounter:   04/02/2022    Other Pertinent Information:     I met with Sheri Garza following a referral from OT. She is feeling some uncertainty about her condition and worries about getting her strength back. She has a strong desire to regain a sense of independence and return home. Her family is a primary source of support. She also carries a sense of courage and hopefulness. I provided a supportive presence. I offered active listening as she shared her story and debriefed her experiences in the hospital. She appeared to cope more effectively for having a supportive presence with her.      04/02/22 1430   Clinical Encounter Type   Reason for Visit Patient/Person/Family Request   Referral From PT/OT   Declined Spiritual Care Visit At This Time No   Visited With Patient   Patient Spiritual Encounters   Spiritual Needs/Issues Uncertainty;Weariness   Spiritual/Coping Resources Beliefs helpful in coping;Loved/supported by family;Courage;Confidence   Coping Explored emotions;Facilitated story telling;Offered empathy;Provided supportive presence;Relationship Writer Provided Non-anxious presence   Information/Education Provided Spiritual Care scope of service   Spiritual Care outcomes with Patient   Spiritual/Emotional Processing  Spiritual Care relationship established;Patient shared/processed his/her/their story;Patient processed emotions    Patient Coping More hopeful   Time of Encounters   Start Time 1430   Stop Time 1450   Duration (minutes) 20 Minutes       Sheri Garza, [place]  Pager: [phone]  Total Time of Encounter: 25 min.

## 2022-04-02 NOTE — OT Treatment (Signed)
[facility]  Rehabilitation Services  Occupational Therapy Treatment Note    Patient Name: Sheri Garza  Date of Birth: 10/25/1935  Height:  149.9 cm (4\' 11" )  Weight:  43.1 kg (94 lb 14.5 oz)  Room/Bed: 220/A  Payor: MEDICARE / Plan: MEDICARE PART A AND B / Product Type: Medicare /     Date/Time of Admission: 03/17/2022  4:27 PM  Admitting Diagnosis:  Aftercare for healing traumatic closed fracture of left hip [S72.002D]    The patient was seen this session for OT Individual Therapy Minutes: 57 total billable minutes.  The following procedures were performed this session OT Treatment, ea 15 min ([id number]), Therapeutic Exercise, ea 15 min ([id number]), and Therapeutic Activity, ea 15 min ([id number]).      Subjective:     Patient with c/o LLE 6/10 pain with movement, however subsides at rest. Nurse aware.       Objective:   Pt in r/r upon OT arrival to room. CNA present. Pt agreeable to OT session.   OT TX (66440): Pt completed toileting routine on toilet with BSC overtop with SBA. Toileting including perineal hygiene and clothing management; with pt continent of urine and BM. Pt then completed STS transfers with FWW and SBA and able to take a few steps to sink to complete grooming and bathing tasks. Pt completed standing UB bathing with Min A to reach back, and seated/standing LB bathing with Min A to reach feet. Pt completed UB dressing to doff/don shirt with SUA, and LB dressing to doff/don socks and pants with Min A to manage items over B feet. Pt then completed standing grooming tasks at sink including hand hygiene and hair care. Pt then sat in w/c to be transported to therapy gym for remainder of OT session.   Therapeutic Activity [id number]): OT facilitated pt participation in dynamic standing functional activity including tossing 10 rings on 6 target cones. Pt able to stand with FWW and SBA for duration of 3 trials of 10 rings. Pt required cues for instruction and demonstration of task,  however was able to complete well following instruction. Functional activities completed to increase performance and independence with ADLs/IADLs, increase endurance and functional activity tolerance.   Therapeutic Exercise [phone]): OT facilitated pt participation in UE strengthening and ROM exercises with 2# DR. Exercises including elbow flexion, shoulder flexion/extension, and posterior/anterior shoulder circles for 1 set of 8 reps for each exercise. Pt required min rest breaks between each exercise secondary to fatigue. OT provided pt with cues for pace and proper form for max benefit of exercise. Pt with good understanding. UE strengthening and ROM exercises to improve overall independence with functional activities, ADLs/IADLs, functional transfers, functional mobility and endurance.   At end of session pt with PT.   Shortness of breath or trouble breathing with exertion (e.g. Walking, bathing, transferring): no  Shortness of breath or trouble breathing when sitting at rest: no  Shortness of breath or trouble breathing when lying flat:  no    Assessment:   Pt participating well and progressing towards goals in POC. Pt is demonstrating increased participation and independence in ADL routine, and increased functional activity tolerance to complete therapeutic activities and exercise.     Plan:   Continue to follow according to established plan of care.    The risks/benefits of therapy have been discussed with the patient/caregiver and he/she is in agreement with the established plan of care.     Therapist:   Denny Peon  Don Perking, [place],  04/02/2022

## 2022-04-02 NOTE — Nurses Notes (Signed)
Patient is alert and oriented. Lung sounds clear. Bowel sounds present and active. Incision lines to left hip are well approximated without redness, edema or drainage. Skilled for therapy. Side rails up x 2. Call bell and PO fluids within reach. Skin and fall precautions maintained.

## 2022-04-02 NOTE — PT Treatment (Signed)
[facility]  [place], [place] [address]  Rehabilitation Services  Physical Therapy Treatment Note    Patient Name: Sheri Garza  Date of Birth: 1935/02/11  Height:  149.9 cm (4\' 11" )  Weight:  43.1 kg (94 lb 14.5 oz)  Room/Bed: 220/A  Payor: MEDICARE / Plan: MEDICARE PART A AND B / Product Type: Medicare /     Date/Time of Admission: 03/17/2022  4:27 PM  Admitting Diagnosis:  Aftercare for healing traumatic closed fracture of left hip [S72.002D]    The patient was seen this session for: PT Individual Therapy Minutes: 55 total individual billable minutes    The patient was seen this session for 55 total billable minutes.    The following procedures were performed this session: Therapeutic Exercise, ea 15 min ([id number]), Therapeutic Activity, ea 15 min ([id number]), and Gait Training, ea 15 min [id number])    Subjective:   Patient report: Pt reports she is excited to be doing better and to be going home with family this weekend.     Shortness of breath or trouble breathing with:              Lying flat- no              Sitting at rest- no  Exertion- no     Patient with c/o 0/10 pain.     Objective:      Interventions this session     PT INTERVENTIONS: THERACT (51884) : dynamic balance activities during standing, bed mob training to improve functional skill (I), transfer training to increase functional task performance, and bending to increase dynamic balance skills and to receive training and education on promoting improved sequencing and coordination techniques. and GAIT TX (16606) pre-gait : weight shift during mobility GAIT TX (30160) : to normalize gait pattern, challenging patient outside of BOS, training in correct sequencing of gait with AD to increase safety, to train on strategies for safely maneuvering around obstacles, with emphasis on negotiating obstacles, directional changes, and with emphasis on increasing safety and performance within the facility GAIT TX reason : educate and train on  effective sequencing and completion of ambulation around obstacles and in open environments , necessitates reeducation in ambulation after suffering musculoskeletal trauma, such as fractures or injuries., instruction on using mobility aids like walkers, crutches, or canes., and Instructing caregivers on appropriate guarding and assistive techniques to support patients during ambulation. for training patients whose walking abilities have been impaired by neurological, muscular, or skeletal abnormalities or trauma. [place] [phone]) : open kinetic chain exercises, closed kinetic chain exercises: standing hip abduction/adduction, seated LAQ, standing marching, ankle pumps, toe raises, and heel raises to [facility] reason : active, assisted, or passive range of motion for improved joint mobility. and resistance exercises that build muscle strength and endurance.      Functional Ability     Assistive devices used with bed mobility:  Bed rail     Rolling:  Independent     Supine - Sit:  Independent     Sit - Supine:  Independent     Sit - stand:  Standby assist     Bed - Chair/ Chair - bed:  Standby assist     Car transfers: NA     Balance while sitting:  Able to maintain position     Balance while standing: Partial physical support     Comments:  She continues to favor/guard full use of (L) LE due to c/o pain.  Ambulation    Ambulation surface:  Ambulation surfaces : Level     Ambulation DistanceAmbulation Distance: up to 276ft.      Ambulation ability:  Standby Marine scientist:  Ambulation Assistive Device: Rolling walker     Ability to maintain weight bearing status:  Fair     Ambulation tolerance:  Fair    Stairs    Ability to manage stairs:  Minimum assist    Comments:  4 stairs 2x with step to pattern.   Patient Fall Risk Reduction and Safety Strategies: Call bell in reach.      Assessment:   Pt is being seen 5x/wk in SNU for rehab following a hospital stay for (L) hip fracture repair resulting in overall  mobility decline as needed for home mngt.  Pt to be participating in daily gait training, transfer/mobility training, strengthening/ROM and endurance & balance training.  Pt is anticipated to progress toward POC goals. She is impacted at this time by pain to (L) LE that limits activity tolerance and effectiveness of PT interventions due to pt reaction to pain.    Plan:   Continue to follow according to established plan of care    Therapist:   Ulis Rias, PT,  04/02/2022

## 2022-04-03 ENCOUNTER — Other Ambulatory Visit: Payer: Self-pay

## 2022-04-03 NOTE — PT Treatment (Signed)
[facility]  [place], [place] [address]  Rehabilitation Services  Physical Therapy Treatment Note    Patient Name: Sheri Garza  Date of Birth: [birth year]  Height:  149.9 cm (4\' 11" )  Weight:  43.1 kg (94 lb 14.5 oz)  Room/Bed: 220/A  Payor: MEDICARE / Plan: MEDICARE PART A AND B / Product Type: Medicare /     Date/Time of Admission: 03/17/2022  4:27 PM  Admitting Diagnosis:  Aftercare for healing traumatic closed fracture of left hip [S72.002D]    The patient was seen this session for: PT Individual Therapy Minutes: 60 total individual billable minutes    The patient was seen this session for 60 total billable minutes.    The following procedures were performed this session: Therapeutic Exercise, ea 15 min ([id number]), Therapeutic Activity, ea 15 min ([id number]), and Gait Training, ea 15 min [id number])    Subjective:   Patient report: Pt reports she is excited to be doing better and to be going home with family this weekend.     Shortness of breath or trouble breathing with:              Lying flat- no              Sitting at rest- no  Exertion- no     Patient with c/o 0/10 pain.     Objective:      Interventions this session     PT INTERVENTIONS: THERACT (63875) : dynamic balance activities during standing, bed mob training to improve functional skill (I), transfer training to increase functional task performance, and bending to increase dynamic balance skills and to receive training and education on promoting improved sequencing and coordination techniques. and GAIT TX (64332) pre-gait : weight shift during mobility GAIT TX (95188) : to normalize gait pattern, challenging patient outside of BOS, training in correct sequencing of gait with AD to increase safety, to train on strategies for safely maneuvering around obstacles, with emphasis on negotiating obstacles, directional changes, and with emphasis on increasing safety and performance within the facility GAIT TX reason : educate and train on  effective sequencing and completion of ambulation around obstacles and in open environments , necessitates reeducation in ambulation after suffering musculoskeletal trauma, such as fractures or injuries., instruction on using mobility aids like walkers, crutches, or canes., and Instructing caregivers on appropriate guarding and assistive techniques to support patients during ambulation. for training patients whose walking abilities have been impaired by neurological, muscular, or skeletal abnormalities or trauma. [place] [phone]) : [employer] to [facility] reason : active, assisted, or passive range of motion for improved joint mobility. and resistance exercises that build muscle strength and endurance     Functional Ability     Assistive devices used with bed mobility:  Bed rail     Rolling:  Independent     Supine - Sit:  Independent     Sit - Supine:  Independent     Sit - stand:  Standby assist     Bed - Chair/ Chair - bed:  Standby assist     Car transfers: NA     Balance while sitting:  Able to maintain position     Balance while standing: Partial physical support     Comments:  She continues to favor/guard full use of (L) LE due to c/o pain.      Ambulation    Ambulation surface:  Ambulation surfaces : Level     Ambulation DistanceAmbulation Distance: up to  243ft.      Ambulation ability:  Standby Marine scientist:  Ambulation Assistive Device: Rolling walker     Ability to maintain weight bearing status:  Fair     Ambulation tolerance:  Fair    Stairs    Ability to manage stairs:  Minimum assist    Comments:  4 stairs 2x with step to pattern.   Patient Fall Risk Reduction and Safety Strategies: Call bell in reach.      Assessment:   Pt is being seen 5x/wk in SNU for rehab following a hospital stay for (L) hip fracture repair resulting in overall mobility decline as needed for home mngt.  Pt to be participating in daily gait training, transfer/mobility training, strengthening/ROM and endurance & balance  training.  Pt is anticipated to progress toward POC goals. She is impacted at this time by pain to (L) LE that limits activity tolerance and effectiveness of PT interventions due to pt reaction to pain.    Plan:   Continue to follow according to established plan of care    Therapist:   Ulis Rias, PT,  04/03/2022

## 2022-04-03 NOTE — OT Treatment (Addendum)
[facility]  Rehabilitation Services  Occupational Therapy Treatment Note    Patient Name: Sheri Garza  Date of Birth: 1935/10/31  Height:  149.9 cm (4\' 11" )  Weight:  43.1 kg (94 lb 14.5 oz)  Room/Bed: 220/A  Payor: MEDICARE / Plan: MEDICARE PART A AND B / Product Type: Medicare /     Date/Time of Admission: 03/17/2022  4:27 PM  Admitting Diagnosis:  Aftercare for healing traumatic closed fracture of left hip [S72.002D]    The patient was seen this session for OT Individual Therapy Minutes: 40 total billable minutes.  The following procedures were performed this session Therapeutic Activity, ea 15 min ([id number]).      Subjective:     Patient with c/o 7/10 LLE pain Nurse aware. Pt states "My leg hurts sometimes, but I know I need to do my therapy to be able to go home!"       Objective:   Pt with PT in therapy gym upon OT arrival. Pt agreeable to OT session.   Therapeutic Activity [phone]): OT facilitated pt participation in seated simulated LB dressing tasks including pulling a theraband circle over the feet, and up to waist as well as pulling clothespins off bottom of pant legs and socks. OT educated pt t/o to improve sequencing, safety, and overall independence in simulated dressing tasks. Pt with good understanding and able to complete dressing activities with SBA and min cues. Pt then completed dynamic standing activity including placing/removing plastic pegs from foam pegboard. Pt required min instructions and demonstrations for activity, and was able to complete activity in standing with FWW and SBA. Lastly, pt completed simulated kitchen task of gathering cones from various heights and locations from kitchen cabinets. Pt utilized FWW, SBA, and cues to find cones, and was able to reach/obtain 6 cones. OT provided pt with education t/o to improve safety and independence with functional activities; pt with good understanding. Functional activities completed to improve functional  activity tolerance, endurance, balance, and overall independence and performance with ADLs/IADLs. At conclusion of session, pt completed functional ambulation from therapy gym to room with FWW and SBA and requested to toilet. Pt able to sit on toilet with BSC overtop with FWW and SBA, and CNA present to assist pt back to bed.   At end of session pt in r/r with CNA present.   Shortness of breath or trouble breathing with exertion (e.g. Walking, bathing, transferring): no  Shortness of breath or trouble breathing when sitting at rest: no  Shortness of breath or trouble breathing when lying flat:  no    Assessment:   Pt participating well and progressing towards goals in POC. Pt is demonstrating increased functional activity tolerance for dynamic standing activities, and increased tolerance and independence in IADLs. Pt demonstrating fair-good understanding for all safety strategies and techniques this date.     Plan:   Continue to follow according to established plan of care.    The risks/benefits of therapy have been discussed with the patient/caregiver and he/she is in agreement with the established plan of care.     Therapist:   Shanon Brow, OT,  04/03/2022

## 2022-04-03 NOTE — Nurses Notes (Signed)
Patient alert and oriented. Patient skilled for therapy. Takes medication whole without any difficulty. Ambulates with walker and standby assistance. Patient has call light within reach.     Lutricia Feil, LPN

## 2022-04-03 NOTE — Care Plan (Signed)
[facility] Speech/Swallow Therapy Discharge  Rehabilitation Services      Patient Name: Sheri Garza  Date of Birth: [birth year]  Height: Height: 149.9 cm (4\' 11" )  Weight: Weight: 43.1 kg (94 lb 14.5 oz)  Room/Bed: 220/A  Payor: Payor: MEDICARE / Plan: MEDICARE PART A AND B / Product Type: Medicare /     Admission Diagnosis: Aftercare for healing traumatic closed fracture of left hip [S72.002D]    Assessment:      Functional Level At Time Of Evaluation: Pt presents with moderate-severe cognitive communication deficits impacting attention, problem-solving, executive functioning, reasoning, organization, planning, and short-term recall. Pt noted to repeat herself several times during assessment. Pt lives at home alone and is responsible for medication + financial management, cooking, cleaning, household ADLs, and is still driving. Pt endorses changes in her memory prior to and following her fall. Pt is AAOx4 with speech and language structures intact. Skilled ST services are recommended and medically necessary to address functional deficits in order to facilitate safe return to PLOF.         Plan:     Recommend continuation of ST services via [facility] or Outpatient to continue to address ongoing cognitive communication deficits.        Evaluation:    Pt has made progress toward goals of treatment in all areas with all STGs and LTG met. Pt is able to recall functional information while utilizing compensatory recall strategies as trained. Improved thought organization and problem-solving skills with complex tasks in order to maximize safety and independence with daily routines. Pt is able to sustain attention for increased duration of time to allow for greater participation and accuracy with therapeutic tasks. Pt continues to demonstrate intermittent forgetfulness and reduced insight to all deficits. While Pt is able to demonstrate accuracy and independence with structured cognitive linguistic tasks, pt may demonstrate difficulty  generalizing skills in functional environment impacting safety and independence with complex tasks. Pt would benefit from supervision upon DC and continued ST services for cognitive communication.      STG 1: Pt will improve short-term memory skills to mild while utilizing compensatory recall strategies as needed with a combination of cues in order to promote independence and decrease level of assistance from caregivers    Status: goal met  STG 2: Pt will complete complex problem-solving tasks with 80% accuracy with a combination of cues in order to safely perform ADLs with decreased need for assistance    Status: goal met  STG 3: Pt will complete thought organization tasks with 80% accuracy while utilizing compensatory strategies with a combination of cues in order to promote safety and independence and promote the ability to safely return home    Status: goal met  STG 4: Pt will sustain attention for 5 min increments while participating with therapeutic tasks with a combination of cues in order to promote independence and decrease level of assistance from caregivers    Status: goal met  LTG 1: Pt will improve cognitive linguistic skills to promote safe d/c to safest functional environment    Status: goal met          [facility], [place]  04/03/2022, 16:06

## 2022-04-03 NOTE — Nurses Notes (Signed)
Patient alert and oriented x 4. She takes her meds whole in applesauce. She ambulated to and from the bathroom with her walker and one assist., Fluids were encouraged prevent dehydration and patient was also encouraged to reposition self to prevent pressure ulcers. Call light within reach.

## 2022-04-03 NOTE — Care Management Notes (Signed)
[facility]  Care Management Note    Patient Name: Sheri Garza  Date of Birth: October 31, 1935  Sex: female  Date/Time of Admission: 03/17/2022  4:27 PM  Room/Bed: 220/A  Payor: MEDICARE / Plan: MEDICARE PART A AND B / Product Type: Medicare /    LOS: 17 days   Primary Care Providers:  [facility] ([facility])    Admitting Diagnosis:  Aftercare for healing traumatic closed fracture of left hip [S72.002D]    Assessment:      04/03/22 1140   Assessment Details   Assessment Type Continued Assessment   Date of Care Management Update 04/03/22   Date of Next DCP Update 04/04/22   Care Management Plan   Discharge Planning Status plan in progress   Projected Discharge Date 04/05/22   Discharge Needs Assessment   Equipment Needed After Discharge hospital bed;commode;other (see comments)  (front wheeled walker)   Discharge Facility/Level of Care Needs Home with Home Health and DME (code 6)   Transportation Available family or friend will provide     Discharge Plan:  Home with [place] and DME (code 6)  Referral was sent to [facility], however due to pt traveling to [place] for a week and a half, [place] is recommending new orders and an updated note closer to the start of care. MSW has spoken with pt's son Aaron Edelman several times over the last few days. Son's solution is to ask pt's Ortho physician to sign new [facility] orders on her next follow up appointment which will be on Wed 04/17/22. Pt's son will be picking up pt on Tuesday 4/3 to bring her home, they will go to her Ortho appointment on 4/4 and then he will return home and pt will remain in her home in [place].     MSW provided pt's son with a list of private duty caregivers for this area. He plans on contacting them to see if/when they would be available to provide more support to pt. Pt also depends heavily on her neighbors for support. MSW provided son with agency name, phone and fax number to fax updated [facility] orders and MD note  from visit on 4/4. [place] liaison has been made aware of plans and will be on the look out for updated orders.     DME was sent to [facility]. [facility] will be coordinating delivery of hospital bed with pt's neighbor Clair Gulling in [place]. Per son, her granddaughter will be renting one for the time she will be in [place]. MSW spoke with [employer] regarding BSC and FWW, per [employer], the Medicare guidelines for those pieces of equipment are conflicting, therefore they will not approve both items for this pt. [facility] provided the private pay cost of the [facility]. MSW then spoke with son again to explain conflicting guidelines and son requested that they only bill insurance for hospital bed and FWW and he will order the [facility] on [address]. The price is less expensive on [place]. MSW notified [employer] of change. They will coordinate delivery of hospital bed to pt's home on Friday 3/22. MSW will obtain FWW from [place] [facility].    The patient will continue to be evaluated for developing discharge needs.     Case Manager: Dorna Bloom, [place]  Phone: [phone]

## 2022-04-03 NOTE — Nurses Notes (Signed)
Patient alert and oriented. Patient skilled for therapy. Patient ambulates with walker and standby assistance. Patient participated in therapy. Call light within reach.     Lutricia Feil, LPN

## 2022-04-03 NOTE — Care Plan (Signed)
[facility]  [place]  Speech Therapy Progress Note    Patient Name: Sheri Garza  Date of Birth: [birth year]  Weight:  43.1 kg (94 lb 14.5 oz)  Room/Bed: 220/A  Payor: MEDICARE / Plan: MEDICARE PART A AND B / Product Type: Medicare /      Date/Time of Admission: 03/17/2022  4:27 PM  Admitting Diagnosis:  Aftercare for healing traumatic closed fracture of left hip [S72.002D]        Assessment:  Cognitive communication deficits present and improving.    Plan: DC ST services.  Recommendations: Continue ST services via home health or outpatient to continue to address cognitive communication deficits.   Results & Recommendations Discussed With:Patient, Nurse, and MD   Continue to follow patient according to established plan of care.  The risks/benefits of therapy have been discussed with the patient/caregiver and he/she is in agreement with the established plan of care.       Subjective:  Flowsheet Info:     04/03/22 1552   Therapist Pager   SLP Pager Debe Coder [phone]   Rehab Session   Document Type therapy progress note (daily note)   Total SLP Minutes: 15   Patient Effort good   Symptoms Noted During/After Treatment none   General Information   Patient Profile Reviewed yes     Pt was awake, alert, upright in her recliner and breathing on room air comfortably.    Objective:  Pt participated with skilled services for cognitive communication. SLP offered ongoing Pt education and training with compensatory recall strategies to improve carryover of skills, where Pt benefited from min verbal cues. Pt was able to demonstrate functional problem-solving skills while generating solutions to problems of moderate complexity with 80% accuracy with min cues. Instances of concrete thinking and reduced mental flexibility noted. Pt education provided regarding DC recommendations, progress made, goals met in POC, and ongoing functional deficits. Pt verbalized understanding to education  offered.      Therapist:  Sandrea Hammond, ST   Pager #: [phone]  Phone #: [phone]  Treatment Time: 15 minutes

## 2022-04-03 NOTE — Ancillary Notes (Signed)
[place]  Spiritual Care Note    Patient Name:  Sheri Garza  Date of Encounter:   04/03/2022    Other Pertinent Information:   Patient shared current medical condition. Patient friendly and free with information about family history. Explored spiritual connections. Patient comments on dependency upon God and appreciative of family and community. Acknowledged and supported patient's beliefs.     04/03/22 1346   Clinical Encounter Type   Reason for Visit General Spiritual Care Visit   Referral From Chaplain-initiated   [place] Visit At This Time No   Visited With Patient   Patient Spiritual Encounters   Spiritual Needs/Issues Pain   Spiritual/Coping Resources Loved/supported by family;Beliefs in God/Sacred/Higher Purpose;Sense of community   Coping Provided supportive presence;Relationship building;Explored emotions;Facilitated story telling   Ritual Prayer   Other Support Services Provided Non-anxious presence   Spiritual Care outcomes with Patient   Spiritual/Emotional Processing  Patient processed emotions;Connected to spiritual support    Patient Coping Coping more effectively   Time of Encounters   Start Time 1346   Stop Time 1416   Duration (minutes) 30 Minutes                Shearon Balo, [facility]  Pager: [phone]  Total Time of Encounter: 35 min.

## 2022-04-03 NOTE — Nurses Notes (Signed)
Discussion with resident shows she does not need an interpreter; preferred language is [place]; ethnicity is no not of Hispanic, Latino/a, or spanish origin; race is white.

## 2022-04-04 ENCOUNTER — Other Ambulatory Visit: Payer: Self-pay

## 2022-04-04 ENCOUNTER — Encounter (SKILLED_NURSING_FACILITY): Payer: Self-pay

## 2022-04-04 DIAGNOSIS — I252 Old myocardial infarction: Secondary | ICD-10-CM

## 2022-04-04 DIAGNOSIS — W19XXXA Unspecified fall, initial encounter: Secondary | ICD-10-CM

## 2022-04-04 DIAGNOSIS — Z8619 Personal history of other infectious and parasitic diseases: Secondary | ICD-10-CM

## 2022-04-04 MED ORDER — REFRESH OPTIVE SENSITIVE (PF) 0.5 %-0.9 % EYE DROPS IN A DROPPERETTE
1.0000 [drp] | Freq: Every day | OPHTHALMIC | 0 refills | Status: AC
Start: 2022-04-04 — End: ?

## 2022-04-04 MED ORDER — MELATONIN 3 MG TABLET
3.0000 mg | ORAL_TABLET | Freq: Every evening | ORAL | Status: DC | PRN
Start: 2022-04-04 — End: 2023-08-06

## 2022-04-04 MED ORDER — PRESERVISION AREDS-2 250 MG-90 MG-40 MG-1 MG CAPSULE
1.0000 | ORAL_CAPSULE | Freq: Two times a day (BID) | ORAL | Status: AC
Start: 2022-04-04 — End: ?

## 2022-04-04 MED ORDER — LACTOBACILLUS RHAMNOSUS GG 15 BILLION CELL SPRINKLE CAPSULE
1.0000 | ORAL_CAPSULE | Freq: Two times a day (BID) | ORAL | Status: DC
Start: 2022-04-04 — End: 2022-04-05
  Administered 2022-04-04: 0 via ORAL
  Administered 2022-04-05: 1 via ORAL
  Filled 2022-04-04 (×2): qty 1

## 2022-04-04 MED ORDER — ACETAMINOPHEN 325 MG TABLET
650.0000 mg | ORAL_TABLET | ORAL | Status: AC | PRN
Start: 2022-04-04 — End: ?

## 2022-04-04 MED ORDER — LACTOBACILLUS RHAMNOSUS GG 15 BILLION CELL SPRINKLE CAPSULE
1.0000 | ORAL_CAPSULE | Freq: Two times a day (BID) | ORAL | Status: DC
Start: 2022-04-04 — End: 2023-08-06

## 2022-04-04 MED ORDER — DEXTROMETHORPHAN-GUAIFENESIN 10 MG-100 MG/5 ML ORAL SYRUP
10.0000 mL | ORAL_SOLUTION | ORAL | Status: DC | PRN
Start: 2022-04-04 — End: 2022-04-04

## 2022-04-04 NOTE — Discharge Summary (Signed)
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] 03/29/2022    Femur fracture, left (CMS HCC) [S72.92XA] 03/29/2022    Swelling of left lower extremity [M79.89] 03/19/2022    On deep vein thrombosis (DVT) prophylaxis [Z79.899] 03/19/2022    Edema of left lower extremity [R60.0] 03/19/2022    At risk for malnutrition [Z91.89] 03/18/2022    Left hip pain [M25.552] 03/18/2022    Acute blood loss as cause of postoperative anemia [D62] 03/17/2022    Atrophy of muscle of multiple sites [M62.59] 03/17/2022    Physical deconditioning [R53.81] 03/17/2022    Aftercare for healing traumatic closed fracture of left hip [S72.002D] 03/03/2022      Resolved Hospital Problems   No resolved problems to display.     Active Non-Hospital Problems    Diagnosis Date Noted    Back pain     DDD (degenerative disc disease), cervical     Insomnia     Kyphoscoliosis and scoliosis     Lumbar spinal stenosis     Osteoarthrosis     Avitaminosis D 09/14/2018    Thyroid nodule 04/01/2016    Spinal stenosis 04/01/2016    Hyperlipidemia 04/01/2016    Right Knee OA 09/21/2012        DISCHARGE MEDICATIONS:     Current Discharge Medication List        START taking these medications.        Details   Lactobacillus rhamnosus GG 15 billion cell Capsule, Sprinkle  Commonly known as: CULTURELLE   1 Capsule, Oral, 2 TIMES DAILY WITH FOOD  Refills: 0     melatonin 3 mg Tablet   3 mg, Oral, NIGHTLY PRN - MAY  REPEAT X 1  Refills: 0     PreserVision AREDS-2 250-90-40-1 mg Capsule  Generic drug: vit C,E-Zn-coppr-lutein-zeaxan   1 Capsule, Oral, 2 TIMES DAILY WITH FOOD  Refills: 0            CONTINUE these medications which have CHANGED during your visit.        Details   Optive Sensitive (PF) 0.5-0.9 % Dropperette  Generic drug: Carboxymethylcell-Glycerin(PF)  What changed: when to take this   1 Drop, Both Eyes, 5 TIMES DAILY  Qty: 1 Each  Refills: 0            CONTINUE these medications - NO CHANGES were made during your visit.        Details   acetaminophen 325 mg Tablet  Commonly known as: TYLENOL   650 mg, Oral, EVERY 4 HOURS PRN  Refills: 0            STOP taking these medications.      aspirin 81 mg Tablet, Delayed Release (E.C.)  Commonly known as: ECOTRIN     traMADoL 50 mg Tablet  Commonly known as: Veatrice Bourbon  Discharge med list refreshed?  YES                     ALLERGIES:  Allergies   Allergen Reactions    Antihistamine [Diphenhydramine Hcl] Swelling    Penicillins      Rash, Dizziness    Vancomycin  Other Adverse Reaction (Add comment)     Phlebitis    Flagyl [Metronidazole]     Gentamicin     Iodine And Iodide Containing Products     Lidocaine      Except xylocaine. [place] taking that    Other      Antihistamines and all caines, Pt states Xylocaine is ok    Tetracycline              HOSPITAL PROCEDURE(S):   Bedside Procedures:  No orders of the defined types were placed in this encounter.    Surgical     REASON FOR HOSPITALIZATION AND HOSPITAL COURSE     BRIEF HPI:  This is a 87 y.o., female admitted for a lt intertrochanteric fracture s/p fall at home. Pt had ORIF on 03/04/22.     BRIEF HOSPITAL NARRATIVE:      Pt was admitted to the [facility] unit on 03/17/22 for rehab d/t  intertrochanteric fx. During stay pt was diagnosed with indeterminate C-Diff, Norovirus and Yersinia Enterocolitica. She was treated with antibiotics and is doing well at this time. She participated with therapy: Functional Ability      Assistive devices used with bed mobility:  Bed rail     Rolling:  Independent     Supine - Sit:  Independent     Sit - Supine:  Independent     Sit - stand:  Standby assist     Bed - Chair/ Chair - bed:  Standby assist     Car transfers: NA     Balance while sitting:  Able to maintain position     Balance while standing: Partial physical support     Comments:  She continues to favor/guard full use of (L) LE due to c/o pain.      Ambulation    Ambulation surface:  Ambulation surfaces : Level     Ambulation DistanceAmbulation Distance: up to 266ft.      Ambulation ability:  Standby Marine scientist:  Ambulation Assistive Device: Rolling walker     Ability to maintain weight bearing status:  Fair     Ambulation tolerance:  Fair     Stairs    Ability to manage stairs:  Minimum assist    Comments:  4 stairs 2x with step to pattern.    Last day with OT:  OT facilitated pt participation in seated simulated LB dressing tasks including pulling a theraband circle over the feet, and up to waist as well as pulling clothespins off bottom of pant legs and socks. OT educated pt t/o to improve sequencing, safety, and overall independence in simulated dressing tasks. Pt with good understanding and able to complete dressing activities with SBA and min cues. Pt then completed dynamic standing activity including placing/removing plastic pegs from foam pegboard. Pt required min instructions and demonstrations for activity, and was able to complete activity in standing with FWW and SBA. Lastly, pt completed simulated kitchen task of gathering cones from various heights and locations from kitchen cabinets. Pt utilized FWW, SBA, and cues to find cones, and was able to reach/obtain 6 cones. OT provided pt with education  t/o to improve safety and independence with functional activities; pt with good understanding. Functional activities completed to improve functional activity tolerance, endurance, balance, and overall  independence and performance with ADLs/IADLs. At conclusion of session, pt completed functional ambulation from therapy gym to room with FWW and SBA and requested to toilet. Pt able to sit on toilet with BSC overtop with FWW and SBA, and CNA present to assist pt back to bed.    All goals were met with Speech including improved cognitive linguistic skills, attention, thought organization with 80% accuracy, complex problem-solving 80% accuracy and improved short-term memory skills. All promoting independence and a decreased level of assistance from a caregiver.        TRANSITION/POST DISCHARGE CARE/PENDING TESTS/REFERRALS:   Pt will be traveling by personal auto to [facility] with family member to stay with her son for a period of time.  She will need continued home health, front wheeled walker and BSC  She is to follow up with a PCP within 1 week.   April 4th at 1:30 a follow up visit with Dr. Silverio Lay        CONDITION ON DISCHARGE:  A. Ambulation: Ambulation with assistive device  B. Self-care Ability: With partial assistance  C. Cognitive Status Oriented x 3  D. Code status at discharge:            LINES/DRAINS/WOUNDS AT DISCHARGE:   Patient Lines/Drains/Airways Status       Active Line / Dialysis Catheter / Dialysis Graft / Drain / Airway / Wound       Name Placement date Placement time Site Days    Wound  Incision Left Hip 03/04/22  1539  -- 30                    DISCHARGE DISPOSITION:  Home discharge and Will stay with family               DISCHARGE INSTRUCTIONS:  Post-Discharge Follow Up Appointments       Thursday Apr 17, 2022    Return Patient Visit with Wendelyn Breslow, MD at  1:30 PM      [facility], [place], [place] [phone]  [phone]             Refer to [facility]     DME - BEDSIDE COMMODE    A standard commode is covered when the patient is physically incapable of  utilizing regular toilet facilities.  An extra wide/heavy duty commode chair is covered for a patient who weighs 300 pounds or more.     I certify that a bedside commode is necessary due to Patient is confined to one level of home and there is no toilet on that level    Bedside Commode Type Standard Bedside Commode    Freedom of Choice: I have informed patient of their freedom of choice with respect to DME providers    Estimated Length of Need (in months; 99 mo.= lifetime) 99      DME - HOSPITAL BED     Ht 149.9 cm    Wt 43.05 kg    Current Attending: Robby Sermon, MD    I certify this pt is under my care as an attending physician & that I, or a collaborating ANP/PA had a face to face encounter with the pt or the durable medical power of attorney, as  appropriate and explained the need for DME on the following date: [id number]    I certify that a hospital bed is necessary due to The patient requires positioning of the body in ways not feasible with an ordinary bed in order to alleviate pain    Bed Type Semi-electric-requires frequent changes in body position / has an immediate need for change in body position    Start Date 04/04/2022    Freedom of Choice: I have informed patient of their freedom of choice with respect to DME providers    Estimated Length of Need (in months; 99 mo.= lifetime) 99      DME - [place]    Please note - If patient is 300 lbs or greater please order bariatric or heavy duty items.     Ht 149.9 cm    Wt 43.05 kg    Current Attending: Robby Sermon, MD    Medical Condition or Diagnosis which is primary reason for equipment: L intertrochanteric fracture s/p ORIF    Patient has mobility limits that significantly impairs ability to participate in one or more mobility related ADL's (MRADL's): Yes    Moblity Limitations: Pt at heightened risk of injury r/t attempts to fulfill MRADL's & can safely use walker which resolves issue    Moblity Limitations: Pt unable to fulfill MRADL's within  reasonable time & can safely use walker which resolves issue    Walker Type: Front Wheeled    Freedom of Choice: I have informed patient of their freedom of choice with respect to DME providers    Estimated Length of Need (in months; 99 mo.= lifetime) 99                 Elgie Congo, APRN,ANP-BC    Copies sent to Care Team         Relationship Specialty Notifications Start End    [facility] PCP - General EXTERNAL  07/11/20     Phone: [phone] Fax: [phone]         [address] [place] [address]              Referring providers can utilize [email or web address] to access their referred [place] patient's information.

## 2022-04-04 NOTE — LTC Plan of Care (Signed)
[facility]  Notice of Transfer or Discharge    Resident Name/Responsible Party: Sherran Medlar  Date: 04/05/2022    Due to the reason indicated below, discharge or transfer from [facility] will be necessary.    [x]   The transfer or discharge is appropriate because your health has improved sufficiently so you no longer need the services provided by this facility.     []  The transfer or discharge is necessary for your welfare and the resident's needs cannot be met in this facility.    []  The safety of other individuals in the facility are endangered.    []  The health of other individuals in the facility are endangered.     []  You have failed after reasonable and appropriate notice to pay for (or to have paid under Medicare) a stay at the facility.      Effective date of transfer: 04/05/2022   Destination of transfer: HOME (with granddaughter temporarily) .

## 2022-04-04 NOTE — Care Management Notes (Signed)
[facility]  Discharge Notice    Sheri Garza  DATE: SATURDAY April 05, 2022  220/A      TRANSPORTED BY:     []  Ambulance:               Phone #                         [x]  Personal Car: Granddaughter: Sheri Garza        Phone #   [phone]            []  Other:                                                                DESTINATION:  Granddaughter's home: [address] [place] [address]                              EQUIPMENT NEEDED: VENDOR AND PHONE TO [place]      []  O2          []  Wheelchair        [x]  [facility]  P: [phone] Delivered to bedside prior to discharge    [x]   [facility] [facility]  Son will be ordering on Amazon    [x]  Hosp Bed    [facility]  P: [phone] Delivered to pt's home in [place] on Friday 3/22    []   Other               AGENCY ASSISTANCE:    AGENCY & PHONE #     []  Meals on Wheels        [x]  [facility] - referral sent  P: [phone] ** DO NOT CALL REPORT. SEE MSW NOTE  F: [phone]     []  [place]        []   [place]        []   Medicaid Waiver        []  Hospice        []  Other        Pt's granddaughter, [address], will be picking up pt to take her home with her to [facility] for a week and a half. Son will be bringing pt back to [facility] on Tuesday April 3rd. She will follow up with Ortho on 4/4; son will accompany her to this appointment. No home health is being set up for the week in 08-01-2005. Referral has been sent to [facility] however due to the gap, [facility] is requesting pt be seen by a separate physician either PCP or Ortho. They request new updated orders and clinic visit note at that time. MSW has provided information to son to relay to Ortho MD for visit on 4/4. [facility] liaison is also aware.      Hospital bed has been delivered to pt's home in [place]. Family plans on renting hospital bed while in [place]. FWW will be  delivered to bedside. Insurance would not cover the FWW and BSC due to conflicting documentation. Son has elected to private pay for [facility] off  of [place].     Dorna Bloom, MSW  [phone]

## 2022-04-04 NOTE — Nurses Notes (Signed)
Alert and [place] x4. Cooperative with care. Tolerating a regular diet without complaints. Takes meds without difficultly. Call bell within reach at all times. Patient continues to work with PT and OT and progressing towards DC goal. Patient to be discharged on 04/05/22 and arrangements have been made.     BP 106/63   Pulse (!) 104   Temp 36.7 C (98 F)   Resp 18   Ht 1.499 m (4\' 11" )   Wt 43.1 kg (94 lb 14.5 oz)   SpO2 94%   BMI 19.17 kg/m     Lysle Dingwall, RN

## 2022-04-04 NOTE — PT Treatment (Signed)
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]    Femur fracture, left (CMS HCC) [S72.92XA]    Swelling of left lower extremity [M79.89]    On deep vein thrombosis (DVT) prophylaxis [Z79.899]    Edema of left lower extremity [R60.0]    Orthopedic aftercare [Z47.89]    At risk for malnutrition [Z91.89]    Left hip pain [M25.552]    Acute blood loss as cause of postoperative anemia [D62]    Atrophy of muscle of multiple sites [M62.59]    Physical deconditioning [R53.81]    Aftercare for healing traumatic closed fracture of left hip [S72.002D]      Therapy Diagnosis Codes: M62.81 (muscle weakness), R26.89 (gait abnormality, and R26.81 (unsteadiness on feet)        Sheri Garza is a 87 y.o. female currently admitted for Aftercare for healing traumatic closed fracture of left hip [S72.002D]. Patient's current reason for discharge:REASON FOR DC: discharge to home with family/support    Service Dates: 03/18/22  to 04/04/22    The patient was seen this session for: PT Individual Therapy Minutes: 60 total individual billable minutes    Pt was seen this session for 60 total billable minutes.    The following procedures were performed this session: Therapeutic Activity, ea 15 min ([id number]) and Gait Training, ea 15 min [phone])    Subjective:     Pt reports she is excited to be going home with family and looking forward to then getting back  home toher house. She states her (L) LE remains sore but is confident in her mobility.      Numeric pain scale:  5/10. Reports no pain at rest, only when on (L) LE.    Shortness of breath or trouble breathing with:   Lying flat- no   Sitting at rest- no  Exertion- no    Objective:      Cognition:  Person, Place, Time, and Situation     Communication ability:  good       Range of Motion     RLE ROM WNL?  yes     LLE ROM WNL?  yes          Strength     RLE strength WNL?  yes  MMT : 4     LLE strength WNL?  yes   MMT : 4-       Sensation:  Intact    Functional Ability     Assistive devices used with bed mobility:  Bed rail     Rolling:  Independent     Supine - Sit:  Independent     Sit - Supine:  Independent     Sit - stand:  Standby assist     Bed - Chair/ Chair - bed:  Standby assist     Car transfers: NA     Balance while  sitting:  Able to maintain position     Balance while standing: Partial physical support     Comments:  She continues to favor/guard full use of (L) LE due to c/o pain.      Ambulation    Ambulation surface:  Ambulation surfaces : Level     Ambulation DistanceAmbulation Distance: up to 262ft.      Ambulation ability:  Standby Marine scientist:  Ambulation Assistive Device: Rolling walker     Ability to maintain weight bearing status:  Fair     Ambulation tolerance:  Fair     Stairs    Ability to manage stairs:  Minimum assist    Comments:  4 stairs 2x with step to pattern.     PT Tests: 30 second Chair Rise, modified with UE support : 9              TUG : 22               2MWT : 121ft                         Assessment:   Progress and Response to treatment: PTPATIENTPROGRESSDC: patient made consistent progress with skilled interventions, patient made substantial functional gains in response to skilled interventions, patients functional abilities have progressed as a result of skilled interventions, patients functional performance has improved as a result of instruction in  compensations, modifications and adaptations, and patient responded positively to passive techniques to stimulate functional performance and enhance safety to prevent further decline    Summary of Interventions provided: Res has undergone PT interventions of TE to enhance functional strength and endurance for basic mobility skills. TA to receive training and education on promoting improved sequencing and coordination techniques. Gait training to educate and train on effective sequencing and completion of ambulation around obstacles and in open environments.      Goals:     Learning goals:   Exercise, Gait, Precaution/Safety, Balance activities, and Transfers/mobility           Physical therapy SHORT TERM goal status:    1. Patient will exhibit bed mob supine<>sit to Standby assist in order to prepare for OOB activity, sitting EOB for ADLs and preparing for transfers/walking. GOAL MET: (I).  2. Patient will improve ability to safely and efficiently transfer to<>bed/chair with Standby assist with implementation of compensatory strategies and with recognition of safety hazards. GOAL MET: SBA.   3. Patient will safely ambulate on Level Ambulation Distance: 258ft with Standby assist and Assistive Devices used: Front wheeled walker to exhibit increased efficiency and safety with lower risk of falls during daily functional tasks and increased (I) performance. GOAL MET: 270ft   4. Patient will negotiate 5 stairs with  Contact guard assist to prepare for RTN to home and community. GOAL MET: 4 stairs 2x with step to pattern.   5. Patient will perform modified, with UE support, 30second Chair Rise: 7 to reveal enhanced functional strength, balance and activity tolerance to reduce risk of falls and improved mobility efficiency. GOAL MET: SBA for 9  6. Patient will decrease risk of falls and reveal enhanced coordination and functional mobility by improved score on TUG to: 38 sec. GOAL MET: SBA 22sec.  7. Patient will exhibit  enhanced functional activity tolerance and mobility efficiency through 2MWT : 164ft. GOAL MET: 172ft.       Physical therapy LONG TERM goals:  1. Patient will  exhibit bed mob supine<>sit to (I) in order to prepare for OOB activity, sitting EOB for ADLs and preparing for transfers/walking. GOAL MET: (I).  2. Patient will improve ability to safely and efficiently transfer to<>bed/chair with (I) with implementation of compensatory strategies and with recognition of safety hazards. GOAL MET: SBA.  3. Patient will safely ambulate on Level Ambulation Distance: 568ft with Standby assist and Assistive Devices used: Front wheeled walker to exhibit increased efficiency and safety with lower risk of falls during daily functional tasks and increased (I) performance. DISCONTINUE: 268ft RW SBA  4. Patient will negotiate 10 stairs with  Contact guard assist to prepare for RTN to home and community.  5. Patient will perform modified, with UE support, 30second Chair Rise: 10 to reveal enhanced functional strength, balance and activity tolerance to reduce risk of falls and improved mobility efficiency. DISCONTINUE: 4 stairs 2x with step to pattern.   6. Patient will decrease risk of falls and reveal enhanced coordination and functional mobility by improved score on TUG to:30.  GOAL MET: SBA 22sec.  7. Patient will exhibit enhanced functional activity tolerance and mobility efficiency through 2MWT : 126ft. DISCONTINUE: 117ft.    Mobility GG section completed yes    Therapist:   Ulis Rias, PT,  04/04/2022

## 2022-04-04 NOTE — OT Treatment (Addendum)
[facility]  Occupational Therapy Discharge Note    Patient Name: Sheri Garza  Date of Birth: 04/06/1935  Height:  149.9 cm (4\' 11" )  Weight:  43.1 kg (94 lb 14.5 oz)  Room/Bed: 220/A  Payor: MEDICARE / Plan: MEDICARE PART A AND B / Product Type: Medicare /     Date/Time of Admission: 03/17/2022  4:27 PM  Admitting Diagnosis:  Aftercare for healing traumatic closed fracture of left hip [S72.002D]  Therapy Diagnosis Codes: History/risk of falls - z91.81, weakness - m62.81, difficulty walking - r26.2     D/C plan:   Patient to D/C to home with family support.       Objective:   Precautions: fall    Activities of Daily Living (ADL's):        Grooming/Oral Hygiene:  SUA       Feeding: SUA        UB bathing: SUA-IND       UB dressing: SUA-IND       LB bathing: SUA-IND       LB dressing: Mod I       Toileting:               Clothing management:IND-SPV             Hygiene:IND-SPV    Instrumental Activities of Daily Living (IADL's): SBA with cues  Balance: fair +  Functional Transfers:      Toilet transfer:  IND     Shower transfer: IND     DME:   FWW       Endurance: Fair   Therapeutic Exercise: as per daily tx notes to increase UE strength in order to perform self care and functional mobility tasks.   Shortness of breath or trouble breathing with exertion (e.g. Walking, bathing, transferring): no  Shortness of breath or trouble breathing when sitting at rest: no  Shortness of breath or trouble breathing when lying flat: no  OT Rehab Goals Bathing Goal;Home Management Goal;LB Dressing Goal;Strength Goal   Bathing Goal   Bathing Goal, Date Established 03/18/22   Bathing Goal, Time to Achieve 30 days   Bathing Goal, Activity Type all bathing tasks   Bathing Goal, Independence Level modified independence GOAL MET   Home Management Goal   Home Mgmt Goal, Date Established 03/18/22   Home Mgmt Goal, Time to Achieve 30 days   Home Mgmt Goal, Activity Type Light  housekeeping and small meal prep   Home Mgmt Goal, Independence Level modified independence GOAL NOT MET; REQUIRES SBA with cues   LB Dressing Goal   LB Dressing Goal, Date Established 03/18/22   LB Dressing Goal, Time to Achieve 30 days   LB Dressing Goal, Activity Type all lower body dressing tasks   LB Dressing Goal, Independence Level modified independence GOAL MET   Strength Goal   Strength Goal, Date Established 03/18/22   Strength Goal, Time to Achieve 30 days   Strength Goal, Measure to Achieve BUEs 5/5 GOAL NOT MET; CURRENTLY ~ 4/5   Strength Goal, Functional Goal Increase BUE strength to facilitae indep with MRADLs with reduced WB tolerance on LLE     Assessment:   Pt has been seen daily in SNF for OT services following a hospital dx of orthopedic aftercare.  Pt has participated in ADL retraining, functional transfer and functional mobility training, home safety education, strength and endurance exercises.   Patient has made good progress towards all established  OT goals in order to return home. Pt has met 2/4 goals in POC. Pt has progressed with ADLs and IADLs as transfers CGA > SBA, grooming CGA > SUA-IND, LB dressing from CGA > Mod I, and home management functional activity tolerance has greatly progressed. MBI Score: 81; moderate dependence. Pt has been instructed on using reacher, and leg lifter for LB dressing to maintain independence and has been instructed to use the FWW for all daily activities when mobile.  Pt to d/c to home with family support. Pt d/c'd from skilled OT services at this time.     Discharge Needs:   Equipment Recommendations: front wheeled walker  The patient presents with mobility limitations due to impaired strength and impaired functional activity tolerance that significantly impair/prevent patient's ability to participate in mobility-related activities of daily living (MRADLs) including  toileting, bathing, safely entering/exiting the home. This functional mobility deficit can  be sufficiently resolved with the use of a  FWW in order to decrease the risk of falls, morbidity, and mortality in performance of these MRADLs.  Patient is able to safely use this assistive device.        Plan:   Discharge OT.       The risks/benefits of therapy have been discussed with the patient/caregiver and he/she is in agreement with the established plan of care.     Therapist:   Shanon Brow, OT,  04/04/2022    Time may include review of medical chart, obtaining patient's functional history from patient/family/medical staff/case management/ancillary personnel, collaboration on findings and treatment options (with the above mentioned individuals), re-assessment, and acute care rehabilitation.

## 2022-04-04 NOTE — Nurses Notes (Signed)
Patient is alert and oriented x 4. She takes her meds whole in applesauce. Her mood is pleasant and cooperative. She ambulates to the bathroom with a walker and one assist, though she was observed earlier in the shift ambulating to the bathroom on her own. After educating her she stated she understood she was to wait for a CNA, but since we didn't have any available at that moment, and all the nurses were busy she was going to go herself since she felt she couldn't hold it anymore. Patient was educated on the dangers of self ambulation without staff assisting and understood. Call light is within reach.

## 2022-04-04 NOTE — Discharge Instructions (Signed)
[name]  Discharge Notice    Sheri Garza  DATE: SATURDAY April 05, 2022  220/A      TRANSPORTED BY:     []  Ambulance:               Phone #                         [x]  Personal Car: Granddaughter: Sheri Garza        Phone #   [phone]            []  Other:                                                                DESTINATION:  Granddaughter's home: [address] [place] [address]                              EQUIPMENT NEEDED: VENDOR AND PHONE TO [place]      []  O2          []  Wheelchair        [x]  [facility]  P: [phone] Delivered to bedside prior to discharge    [x]   [place]  Son will be ordering on [employer]    [x]  Hosp Bed    [facility]  P: [phone] Delivered to pt's home in [place] on Friday 3/22    []   Other               AGENCY ASSISTANCE:    AGENCY & PHONE #     []  Meals on Wheels        [x]  [place] - referral sent  P: [phone] ** DO NOT CALL REPORT. SEE MSW NOTE  F: [phone]     []  [place]        []   [facility]        []   Medicaid Waiver        []  Hospice        []  Other

## 2022-04-04 NOTE — OT Treatment (Signed)
[facility]  Rehabilitation Services  Occupational Therapy Treatment Note    Patient Name: Sheri Garza  Date of Birth: 12-04-1935  Height:  149.9 cm (4\' 11" )  Weight:  43.1 kg (94 lb 14.5 oz)  Room/Bed: 220/A  Payor: MEDICARE / Plan: MEDICARE PART A AND B / Product Type: Medicare /     Date/Time of Admission: 03/17/2022  4:27 PM  Admitting Diagnosis:  Aftercare for healing traumatic closed fracture of left hip [S72.002D]    The patient was seen this session for OT Individual Therapy Minutes: 25 total billable minutes.  The following procedures were performed this session OT Treatment, ea 15 min ([id number]).      Subjective:     Patient with c/o LLE pain, with no rating. Nurse aware.       Objective:   Pt in r/r on toilet upon OT arrival to room. Pt agreeable to OT session.   OT Cameron ([id number]): Pt completed toileting routine including clothing management and perineal hygiene with SPV, STS transfers with FWW and functional ambulation from r/r to bed with SBA-SPV for safety. Pt sat EOB to complete ADL routine. Pt able to complete UB dressing tasks to don/doff shirt with SUA-SPV, LB dressing tasks to don/doff socks, and pants with SUA-Mod I with utilization of a reacher for B socks. Pt completed UB dressing in seated position and LB dressing tasks in seated and standing position. Pt then completed functional bed mobility from sitting EOB > supine in bed with SBA and was able to obtain midline position in bed. OT then educated pt on d/c recs, goal progress, and safety educations for safe return home. OT answered all of pt's questions with pt demonstrating good understanding.   At end of session call bell and phone within reach, with pt resting comfortably in bed.   Shortness of breath or trouble breathing with exertion (e.g. Walking, bathing, transferring): no  Shortness of breath or trouble breathing when sitting at rest: no  Shortness of breath or trouble breathing when lying flat:   no    Assessment:   Pt participating well in OT sessions and is making progress towards goals in POC. Pt demonstrating increased functional activity tolerance, and increased participation and independence with ADL routine.     Plan:   Continue to follow according to established plan of care.    The risks/benefits of therapy have been discussed with the patient/caregiver and he/she is in agreement with the established plan of care.     Therapist:   Shanon Brow, OT,  04/04/2022

## 2022-04-05 ENCOUNTER — Other Ambulatory Visit: Payer: Self-pay

## 2022-04-05 NOTE — Nurses Notes (Signed)
Discharge instructions given to pt & granddaughter-verbalized understanding-prescription escribed to pharmacy & other meds are over the counter-no [facility] report given since pt is going to [place] for a week or so & HH to pick up pt when she returns home-home meds returned to pt-personal belongings packed up-pt happy to be going home. Family members at bedside.

## 2022-04-05 NOTE — Nurses Notes (Signed)
Resident alert verbal oriented X4, able to make needs known to staff members.  Resident is cooperative with care this evening.  Regular diet continues tolerated well with oral supplements provided on meal trays.  Gave evening eye drops, tolerated well no complaints voiced.  Patient is looking forward to being D/C'd home later today.  Resident was skilled for therapy.  Ambulates to the bathroom with wheeled walker and stand by assist of one.  Resting i bed eyes closed call bell and fluids always within reach.

## 2022-04-05 NOTE — Nurses Notes (Signed)
Pt discharged to home-taken to private vehicle via w/c & CNA-personal belongings & family members with her-pt left in no distress.

## 2022-04-07 NOTE — Nurses Notes (Signed)
IDT discussion of resident usual performance during look back period 3/20-3/22/2024 with review of medical record documentation and staff interview shows the following and MDS ARD 04-25-2022 will be coded to reflect this.         Self care   Eating:    setup  Oral Hygiene:   setup  Toileting Hygiene:  supervision or touching assistance  Shower/bathe self:  Supervision or touching assistance  Upper body dressing:  setup  Lower body dressing:  setup  Putting on/taking off footwear:   setup  Personal Hygiene: setup        Mobility   Roll left and right:  Independent  Sit to lying:    Independent  Lying to sitting on side of bed:   Independent  Sit to stand:   Independent  Chair/bed-to chair transfer:   setup  Toilet transfer:     setup  Tub/shower transfer: setup  Car transfer:   setup  Walk 10 feet:   setup  Walk 50 feet with two turns:   setup  Walk 150 feet:   setup  Walk 10 feet on uneven surfaces:  setup  1 step (curb):   supervision or touching assistance  4 steps:    supervision or touching assistance  12 steps:   Not attempted due to medical condition or safety concerns    Picking up object:  supervision or touching assistance  Did the patient use a wheelchair/scooter:  No

## 2022-04-11 ENCOUNTER — Other Ambulatory Visit (HOSPITAL_BASED_OUTPATIENT_CLINIC_OR_DEPARTMENT_OTHER): Payer: Self-pay | Admitting: Specialist

## 2022-04-11 DIAGNOSIS — Z09 Encounter for follow-up examination after completed treatment for conditions other than malignant neoplasm: Secondary | ICD-10-CM

## 2022-04-16 NOTE — Progress Notes (Signed)
Activities Coordinator contacted patient today to follow up post discharge. Patient was discharged from our facility on 04/05/22. Patient reports they are doing well at home and appreciates the call.       Hazle Quant, Activities Coordinator, FMT, SNU

## 2022-04-17 ENCOUNTER — Other Ambulatory Visit: Payer: Self-pay

## 2022-04-17 ENCOUNTER — Inpatient Hospital Stay (HOSPITAL_BASED_OUTPATIENT_CLINIC_OR_DEPARTMENT_OTHER)
Admission: RE | Admit: 2022-04-17 | Discharge: 2022-04-17 | Disposition: A | Discharge status: Home / Self Care | Payer: Medicare Other | Source: Ambulatory Visit

## 2022-04-17 ENCOUNTER — Encounter (HOSPITAL_BASED_OUTPATIENT_CLINIC_OR_DEPARTMENT_OTHER): Payer: Self-pay | Admitting: Specialist

## 2022-04-17 ENCOUNTER — Ambulatory Visit: Payer: Medicare Other | Attending: Specialist | Admitting: Specialist

## 2022-04-17 VITALS — Ht 59.0 in | Wt 95.0 lb

## 2022-04-17 DIAGNOSIS — Z09 Encounter for follow-up examination after completed treatment for conditions other than malignant neoplasm: Secondary | ICD-10-CM | POA: Insufficient documentation

## 2022-04-17 DIAGNOSIS — S72142A Displaced intertrochanteric fracture of left femur, initial encounter for closed fracture: Secondary | ICD-10-CM | POA: Insufficient documentation

## 2022-04-17 NOTE — Progress Notes (Unsigned)
PATIENT NAME: Sheri Garza, Sheri Garza   HOSPITAL NUMBER:  [id number]  DATE OF SERVICE: 04/17/2022  DATE OF BIRTH:  January 18, 1935    PROGRESS NOTE    SUBJECTIVE:  This is a pleasant 87 year old female.  She is status post intertrochanteric nail for the left femoral neck fracture.  She is doing very well.  She has no pain in her left hip.  She is otherwise happy with her outcome  there.  She has been noticing a lot of pain and swelling in the knee.  She says the knee is quite swollen and tender to walk on.  She came in.  She has noticed also swelling from the knee all the way down the ankle.  She was just concerned about the pain in the knee.  She has no pain really in the foot and ankle area.  She has been doing physical therapy.  They have been working compression hose.    OBJECTIVE:  Today, when I exam here she has no pain with internal and external rotation of the hip.  Her incisions are well healed.  She has good strength in all 4 planes.  She does have a moderately swollen knee, however it is not warm and mildly tender.    IMAGING:  Today, I told her x-rays, AP pelvis and 2 views of the left hip shows a well healed and well aligned fracture with intact hardware.    ASSESSMENT AND PLAN:  I talked to her about her knee.  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  She has really no significant calf tenderness.  I told her to continue on aspirin twice a day.  At the same time, I offered her a cortisone shot in the left knee.  She said it did not hurt that bad.  She wanted to hold off.  I did recommend compression hose for to help get the swelling down.  I will see her back otherwise as needed.        Lester Carolina, MD                DD:  04/17/2022 17:07:14  DT:  04/17/2022 19:29:43 LA  D#:  [id number]

## 2022-04-17 NOTE — Progress Notes (Signed)
This note will be dictated.     For surgery patients: The surgery was discussed at length with the patient. Alternatives to treatment were discussed as well. The risks of the planned procedure include, but are not limited to the possibility of infection, nerve damage, tendon damage, wound healing problems, incomplete relief of pain, and need for specialized footwear in the future. The risks of smoking were discussed as well. The postoperative course was discussed with the patient. Informed consent was obtained. All questions were answered, and we told the patient to call before surgery if they had any questions about the operation.     Surgery patients were ordered a CBC,BMP/CMP and EKG.

## 2022-04-17 NOTE — Addendum Note (Signed)
Addended by: Lavena Bullion on: 04/17/2022 02:11 PM     Modules accepted: Orders

## 2022-04-24 ENCOUNTER — Telehealth (HOSPITAL_BASED_OUTPATIENT_CLINIC_OR_DEPARTMENT_OTHER): Payer: Self-pay | Admitting: Physician Assistant

## 2022-04-24 ENCOUNTER — Other Ambulatory Visit (HOSPITAL_BASED_OUTPATIENT_CLINIC_OR_DEPARTMENT_OTHER): Payer: Self-pay | Admitting: Specialist

## 2022-04-24 ENCOUNTER — Telehealth (HOSPITAL_BASED_OUTPATIENT_CLINIC_OR_DEPARTMENT_OTHER): Payer: Self-pay | Admitting: Specialist

## 2022-04-24 NOTE — Telephone Encounter (Addendum)
Called patient to let her know that the prescription was called into her pharmacy. Patient agreed and verbalized understanding.   Iva Lento, MA             ----- Message from Rush Landmark, PA-C sent at 04/24/2022  3:00 PM EDT -----  Regarding: RE: Dr. Shaune Spittle: [phone]  Bactim 7 days call with update on Monday . She was supposed compression stockings  ----- Message -----  From: Iva Lento, MA  Sent: 04/24/2022   2:53 PM EDT  To: Rush Landmark, PA-C  Subject: FW: Dr. Natale Lay                                  ----- Message -----  From: Plaugher, Trudy A  Sent: 04/24/2022   2:35 PM EDT  To: #  Subject: Dr. Natale Lay                                    Pt had surgery on 02-20, and Bonita Quin which is her care giving stated about a week ago, she notice that both of her legs are really swollen.    But the Left leg has a red streak from the ankle to the knee.  She is having a lot of pain in that leg.  It looks like she is getting blisters now.  Very warm to touch.    She would like to speak to someone to see what to do.  It is getting worse.    Call [phone]

## 2022-04-24 NOTE — Telephone Encounter (Signed)
Called in Bactrim 800-160mg  bid x7 days for patient to [facility] in [facility].     Lessie Dings, [place]

## 2022-05-02 ENCOUNTER — Ambulatory Visit (INDEPENDENT_AMBULATORY_CARE_PROVIDER_SITE_OTHER): Payer: Medicare Other | Admitting: Foot Surgery

## 2022-05-02 ENCOUNTER — Other Ambulatory Visit: Payer: Self-pay

## 2022-05-02 ENCOUNTER — Encounter (INDEPENDENT_AMBULATORY_CARE_PROVIDER_SITE_OTHER): Payer: Self-pay | Admitting: Foot Surgery

## 2022-05-02 VITALS — Ht 59.0 in | Wt 95.0 lb

## 2022-05-02 DIAGNOSIS — M79672 Pain in left foot: Secondary | ICD-10-CM

## 2022-05-02 DIAGNOSIS — I872 Venous insufficiency (chronic) (peripheral): Secondary | ICD-10-CM

## 2022-05-02 DIAGNOSIS — L84 Corns and callosities: Secondary | ICD-10-CM

## 2022-05-02 DIAGNOSIS — M79671 Pain in right foot: Secondary | ICD-10-CM

## 2022-05-02 NOTE — Progress Notes (Signed)
MEDICINE   Department of Orthopaedics  [facility]    Sheri Garza is a 87 y.o. female    Chief Complaint   Patient presents with    Callous           Past Medical History:     Past Medical History:   Diagnosis Date    Back pain     DDD (degenerative disc disease), cervical     Dry eye     Fracture of nasal bones     Hypertriglyceridemia     Insomnia     Kyphoscoliosis and scoliosis     Lumbar spinal stenosis     Osteoarthrosis     Osteoporosis     Spinal stenosis     Thyroid nodule     Vitamin D deficiency          Past Surgical History:   Procedure Laterality Date    HX CATARACT REMOVAL Left     HX CHOLECYSTECTOMY      HX COLONOSCOPY      HX DILATION AND CURETTAGE      HX OTHER      trigger finger, repair quad    HX TONSILLECTOMY      HX WISDOM TEETH EXTRACTION             Family History:  Family Medical History:       Problem Relation (Age of Onset)    Cancer Other, Father, Brother    Diabetes Other, Mother    HTN <20 y.o. Other    Heart Disease Mother                Social History:     Social History     Tobacco Use    Smoking status: Former    Smokeless tobacco: Never   Substance Use Topics    Alcohol use: No       Medications:    acetaminophen (TYLENOL) 325 mg Oral Tablet, Take 2 Tablets (650 mg total) by mouth Every 4 hours as needed  Carboxymethylcell-Glycerin,PF, (OPTIVE SENSITIVE, PF,) 0.5-0.9 % Ophthalmic Dropperette, Instill 1 Drop into both eyes Five times a day  Lactobacillus rhamnosus GG (CULTURELLE) 15 billion cell Oral Capsule, Sprinkle, Take 1 Capsule by mouth Twice daily with food  melatonin 3 mg Oral Tablet, Take 1 Tablet (3 mg total) by mouth Nightly as needed, may repeat one time  vit C,E-Zn-coppr-lutein-zeaxan (PRESERVISION AREDS-2) 250-90-40-1 mg Oral Capsule, Take 1 Capsule by mouth Twice daily with food    No facility-administered medications prior to visit.      Allergies:     Allergies   Allergen Reactions    Antihistamine [Diphenhydramine Hcl] Swelling     Penicillins      Rash, Dizziness    Vancomycin  Other Adverse Reaction (Add comment)     Phlebitis    Flagyl [Metronidazole]     Gentamicin     Iodine And Iodide Containing Products     Lidocaine      Except xylocaine. Ok taking that    Other      Antihistamines and all caines, Pt states Xylocaine is ok    Tetracycline        Patient Active Problem List   Diagnosis    Right Knee OA    Thyroid nodule    Spinal stenosis    Hyperlipidemia    Avitaminosis D    Back pain    DDD (degenerative disc disease), cervical  Insomnia    Kyphoscoliosis and scoliosis    Lumbar spinal stenosis    Osteoarthrosis    Aftercare for healing traumatic closed fracture of left hip    Acute blood loss as cause of postoperative anemia    Atrophy of muscle of multiple sites    Physical deconditioning    Orthopedic aftercare    At risk for malnutrition    Left hip pain    Swelling of left lower extremity    On deep vein thrombosis (DVT) prophylaxis    Edema of left lower extremity    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 female presents today with complaint of calluses on both of her feet. She points to the ball of the right foot and the heel and the ball of the left foot. Patient does trim her own toenails. Mentions today she usually wears support hose but at times they are difficult to put on. This visit she relates she broke her left hip and is having swelling in both feet. She does elevate the left leg and is in PT.    All other ROS Negative    Physical Exam:    Vascular exam       Dorsalis pedis pulse Right  1/4 Left   1/4   Posterior tibial pulse 1+ 1+   Capilary fill time  Less than 3 seconds Less than 3 seconds   Skin temperature cool cool   Edema 0 0   Comments:       Neurological exam      Right Left    Gross sensation intact to touch? yes yes   Paresthesia noted?  no no   Vibratory  sensation? intact intact   Comments:       Dermatologic exam      Right Left    Open wounds or lesions? yes and comments Hyperkeratosis is noted plantar aspect the 2nd met head and the plantar right heel. yes and comments Area hyperkeratosis is noted plantar aspect of the 1st metatarsal head.   Ecchymosis 0 0   Erythema 0 0   Edema 0 0   Hyperpigmentation yes yes   Nail appearance Normal in appearance 1-5 Normal in appearance 1-5   Comments:       Musculoskeletal exam      Right Left    Dorsiflexion 5+ 5+   Plantar flexion 5+ 5+   Inversion 5+ 5+   Eversion 5+ 5+   Intrinsic muscle strength     Range of Motion Pain-free range of motion AJ STJ MPJs Pain-free range of motion AJ STJ MPJs   Crepitus no no   Foot type planus planus   Comments:             Assessment:    ICD-10-CM    1. Callus  L84       2. Left foot pain  M79.672       3. Right foot pain  M79.671       4. Venous insufficiency of both lower extremities  I87.2             Plan of Care:  1. Recommended vaseline to areas of callus daily.  Also discussed different forms of padding.    Follow up:  Return  in about 3 months (around 08/01/2022).  I have asked Sheri Garza to follow up in 3 months or PRN if symptoms worsen or fail to improve.     I am scribing for, and in the presence of, Dr. Ninfa Linden for services provided on 05/02/2022.  Alfonzo Beers, SCRIBE   [place], [place]  05/02/2022, 14:10  I personally performed the services described in this documentation, as scribed  in my presence, and it is both accurate  and complete.    Ninfa Linden, DPM  Ninfa Linden, DPM

## 2022-05-08 ENCOUNTER — Telehealth (HOSPITAL_BASED_OUTPATIENT_CLINIC_OR_DEPARTMENT_OTHER): Payer: Self-pay | Admitting: Specialist

## 2022-05-08 NOTE — Telephone Encounter (Addendum)
I spoke to pt and advised to use her walker, she reports she has been using the walker. States her pain is more into her lower back now. I advised pt to continue using walker and scheduled her for 4/29 per provider at 1:30, she voiced understanding. Pt was notified to go to the nearest ED if pain worsened prior to Monday, she voiced understanding.   Lenda Kelp Laurier Jasperson, LPN        ----- Message from Rush Landmark, PA-C sent at 05/08/2022  2:35 PM EDT -----  Regarding: RE: Rush Landmark  Contact: [phone]  Tell her to get back on her walker and bring her in to see [place] on Monday. If she cannot do that that, however, the ER to evaluate it.  ----- Message -----  From: Clayborne Dana, LPN  Sent: 0/98/1191   2:33 PM EDT  To: Rush Landmark, PA-C  Subject: FW: Rush Landmark                                 Please advise.     Pt had surgery on 03/05/22 for Left intertrochanteric hip fracture.F/u on 3/14 and 4/4. Had recent antibiotics prescribed on 4/11.     Thanks  ----- Message -----  From: Plaugher, Trudy A  Sent: 05/08/2022  12:11 PM EDT  To: #  Subject: Rush Landmark                                     Pt had surgery for her hip, and she states she is having a lot of pain, and would like to speak to [place] regarding it.    Call [phone]

## 2022-05-12 ENCOUNTER — Inpatient Hospital Stay (HOSPITAL_BASED_OUTPATIENT_CLINIC_OR_DEPARTMENT_OTHER)
Admission: RE | Admit: 2022-05-12 | Discharge: 2022-05-12 | Disposition: A | Discharge status: Home / Self Care | Payer: Medicare Other | Source: Ambulatory Visit

## 2022-05-12 ENCOUNTER — Other Ambulatory Visit: Payer: Self-pay

## 2022-05-12 ENCOUNTER — Encounter (HOSPITAL_BASED_OUTPATIENT_CLINIC_OR_DEPARTMENT_OTHER): Payer: Self-pay | Admitting: Specialist

## 2022-05-12 ENCOUNTER — Other Ambulatory Visit (HOSPITAL_BASED_OUTPATIENT_CLINIC_OR_DEPARTMENT_OTHER): Payer: Self-pay | Admitting: Specialist

## 2022-05-12 ENCOUNTER — Ambulatory Visit: Payer: Medicare Other | Attending: Specialist | Admitting: Specialist

## 2022-05-12 VITALS — Ht 59.0 in | Wt 86.0 lb

## 2022-05-12 DIAGNOSIS — M4316 Spondylolisthesis, lumbar region: Secondary | ICD-10-CM

## 2022-05-12 DIAGNOSIS — M4126 Other idiopathic scoliosis, lumbar region: Secondary | ICD-10-CM

## 2022-05-12 DIAGNOSIS — M545 Low back pain, unspecified: Secondary | ICD-10-CM

## 2022-05-12 DIAGNOSIS — S72142A Displaced intertrochanteric fracture of left femur, initial encounter for closed fracture: Secondary | ICD-10-CM

## 2022-05-12 DIAGNOSIS — M48062 Spinal stenosis, lumbar region with neurogenic claudication: Secondary | ICD-10-CM | POA: Insufficient documentation

## 2022-05-12 DIAGNOSIS — M5136 Other intervertebral disc degeneration, lumbar region: Secondary | ICD-10-CM

## 2022-05-12 DIAGNOSIS — S72142D Displaced intertrochanteric fracture of left femur, subsequent encounter for closed fracture with routine healing: Secondary | ICD-10-CM

## 2022-05-12 MED ORDER — PREDNISONE 2.5 MG TABLET
2.5000 mg | ORAL_TABLET | Freq: Every day | ORAL | 0 refills | Status: AC
Start: 2022-05-12 — End: 2022-05-22

## 2022-05-12 NOTE — Addendum Note (Signed)
Addended bySanjuan Dame on: 05/12/2022 09:36 AM     Modules accepted: Orders

## 2022-05-12 NOTE — Progress Notes (Signed)
This note will be dictated.     For surgery patients: The surgery was discussed at length with the patient. Alternatives to treatment were discussed as well. The risks of the planned procedure include, but are not limited to the possibility of infection, nerve damage, tendon damage, wound healing problems, incomplete relief of pain, and need for specialized footwear in the future. The risks of smoking were discussed as well. The postoperative course was discussed with the patient. Informed consent was obtained. All questions were answered, and we told the patient to call before surgery if they had any questions about the operation.     Surgery patients were ordered a CBC,BMP/CMP and EKG.

## 2022-05-12 NOTE — Addendum Note (Signed)
Addended bySanjuan Dame on: 05/12/2022 09:39 AM     Modules accepted: Orders

## 2022-05-13 NOTE — Progress Notes (Signed)
PATIENT NAME: RAJA, CAPUTI   HOSPITAL NUMBER:  [id number]  DATE OF SERVICE: 05/12/2022  DATE OF BIRTH:  1935/10/02    PROGRESS NOTE    SUBJECTIVE:  This is an 87 year old female.  She underwent open reduction and fixation of an intertrochanteric fracture on the left, almost 3 months ago, by me.  She really came in today because she has been having increasing pain in both of her legs.  She says when she rests or lays she has no pain, but if she goes to stand up, she has a lot of pain shooting down both leg. She said it is very difficult for her to stand.  She came in to see me of this pain.  She thought it might be something wrong with the hip.  She came in for my opinion and my evaluation.    OBJECTIVE:  Today when I examine her, she has good dorsalis pedis and tibialis posterior pulses.  Her toes are pink and warm with brisk cap refill.  She has no tenderness to palpation over either calf and a negative Homans sign bilaterally.  She has mild 1+ edema in both lower extremities.  She has really no pain with internal and external rotation of the hip and no tenderness to palpation over the left hip.  Most of her pain seems to be coming from a positive leg raise on either leg.    IMAGING:  Today, we got x-rays, an AP pelvis and 2 views of the left hip, which otherwise shows stable fixation, early good fracture healing, and acceptable alignment.    X-rays of the lumbar spine, 3 views, shows advanced severe scoliosis with listhesis of any of the segments and advanced degenerative changes.    ASSESSMENT AND PLAN:  I told her it is coming from her back.  I talked to her about different treatment options for her back.  I talked to her about just a low dose of a steroid.  She states she is very sensitive to all medications.  I told her we could try just a very low dose of steroid and see if that can help.  We are going to try 2 mg daily for 10 days.  We also talked about the potential need for it in the future, doing other  denervation on the spine with these injections.  However, I referred her onto the chronic pain service. She did not seem interested in injections or any interventions, and she said she has extensive allergies.  I told her there is really not much else I can offer her. It is clearly coming from her spine.  It is not her hips.  It is not a blood clot and I answered all her questions.  I will see her back otherwise as needed.        Lester [place], MD              DD:  05/12/2022 16:26:34  DT:  05/13/2022 03:32:18 MH  D#:  [id number]

## 2022-08-01 ENCOUNTER — Encounter (INDEPENDENT_AMBULATORY_CARE_PROVIDER_SITE_OTHER): Payer: Medicare Other | Admitting: Foot Surgery

## 2022-09-04 ENCOUNTER — Telehealth (HOSPITAL_BASED_OUTPATIENT_CLINIC_OR_DEPARTMENT_OTHER): Payer: Self-pay | Admitting: Specialist

## 2022-09-04 NOTE — Telephone Encounter (Addendum)
I called patient to let her know that if she is paralyzed we recommend her to go to the ER. Patient stated that she was not paralyzed just having pain and swelling. Pt was made an appointment on 09/11/22 @ 1130. Patient agreed and confirmed appointment.   Iva Lento, MA                 ----- Message from Rush Landmark sent at 09/04/2022  2:35 PM EDT -----  Regarding: RE: Dr. Shaune Spittle: [phone]  We have not seen her since April. We can not see hr today. If she is paralyzed she needs to go to the ER to be evaluated immediately . We can't see her until Monday . But i advise her go to the ER. Its coming from her spine per his last note.  ----- Message -----  From: Iva Lento, MA  Sent: 09/04/2022   1:18 PM EDT  To: Rush Landmark, PA-C  Subject: FW: Dr. Natale Lay                                  ----- Message -----  From: Dorena Bodo  Sent: 09/04/2022  12:00 PM EDT  To: #  Subject: Dr. Natale Lay                                    Pt called because she is experiencing swelling in both legs that is hindering her ability to move around. Pt is very scared and because her legs are also discolored and she is essentially paralyzed. Pt would like to speak to someone today.    Thanks!

## 2022-09-10 ENCOUNTER — Encounter (INDEPENDENT_AMBULATORY_CARE_PROVIDER_SITE_OTHER): Payer: Medicare Other | Admitting: Foot Surgery

## 2022-09-10 ENCOUNTER — Other Ambulatory Visit (HOSPITAL_BASED_OUTPATIENT_CLINIC_OR_DEPARTMENT_OTHER): Payer: Self-pay | Admitting: Specialist

## 2022-09-10 DIAGNOSIS — M79672 Pain in left foot: Secondary | ICD-10-CM

## 2022-09-11 ENCOUNTER — Inpatient Hospital Stay (HOSPITAL_BASED_OUTPATIENT_CLINIC_OR_DEPARTMENT_OTHER)
Admission: RE | Admit: 2022-09-11 | Discharge: 2022-09-11 | Disposition: A | Discharge status: Home / Self Care | Payer: Medicare Other | Source: Ambulatory Visit

## 2022-09-11 ENCOUNTER — Encounter (HOSPITAL_BASED_OUTPATIENT_CLINIC_OR_DEPARTMENT_OTHER): Payer: Self-pay | Admitting: Specialist

## 2022-09-11 ENCOUNTER — Ambulatory Visit: Payer: Medicare Other | Attending: Specialist | Admitting: Specialist

## 2022-09-11 ENCOUNTER — Other Ambulatory Visit: Payer: Self-pay

## 2022-09-11 VITALS — Ht 59.0 in | Wt 86.0 lb

## 2022-09-11 DIAGNOSIS — M5136 Other intervertebral disc degeneration, lumbar region: Secondary | ICD-10-CM | POA: Insufficient documentation

## 2022-09-11 DIAGNOSIS — Z9889 Other specified postprocedural states: Secondary | ICD-10-CM

## 2022-09-11 DIAGNOSIS — M79672 Pain in left foot: Secondary | ICD-10-CM | POA: Insufficient documentation

## 2022-09-11 DIAGNOSIS — M4156 Other secondary scoliosis, lumbar region: Secondary | ICD-10-CM

## 2022-09-11 DIAGNOSIS — M7989 Other specified soft tissue disorders: Secondary | ICD-10-CM | POA: Insufficient documentation

## 2022-09-11 DIAGNOSIS — M79671 Pain in right foot: Secondary | ICD-10-CM | POA: Insufficient documentation

## 2022-09-11 DIAGNOSIS — R600 Localized edema: Secondary | ICD-10-CM

## 2022-09-11 DIAGNOSIS — Z8781 Personal history of (healed) traumatic fracture: Secondary | ICD-10-CM

## 2022-09-11 DIAGNOSIS — M48062 Spinal stenosis, lumbar region with neurogenic claudication: Secondary | ICD-10-CM | POA: Insufficient documentation

## 2022-09-11 MED ORDER — FUROSEMIDE 20 MG TABLET
10.0000 mg | ORAL_TABLET | Freq: Every day | ORAL | 0 refills | Status: AC
Start: 2022-09-11 — End: 2022-09-25

## 2022-09-11 NOTE — Progress Notes (Signed)
[facility] MEDICINE    Sheri Garza  23-Sep-1935  [id number]    This note will be dictated.     Past medical history, past surgical history, family history, social history, medications, allergies and review of systems were gone over with the patient and noted on the chart.     PHYSICAL EXAMINATION: Throughout the physical examination, the patient was noted to be alert, awake, oriented and relaxed. Examination of ankle jerk reflexes was within normal limits. Tests of coordination reveal normal rapid alternating movements. Inspection of the foot reveals no significant atrophy. Normal tone was noted. Lower extremities were without significant malalignment. Sensation was intact throughout. Strength was tested in four planes and was within normal limits.  Has good dorsalis pedis and tibialis posterior pulses. The toes are pink and warm with less than capillary refill.     For surgery patients: The surgery was discussed at length with the patient. Alternatives to treatment were discussed as well. The risks of the planned procedure include, but are not limited to the possibility of infection, nerve damage, tendon damage, wound healing problems, incomplete relief of pain, and need for specialized footwear in the future. The risks of smoking were discussed as well. The postoperative course was discussed with the patient. Informed consent was obtained. All questions were answered, and we told the patient to call before surgery if they had any questions about the operation.     Surgery patients were ordered a CBC,BMP/CMP and EKG.

## 2022-09-12 NOTE — Progress Notes (Signed)
PATIENT NAME: Sheri, [name]   HOSPITAL NUMBER:  [id number]  DATE OF SERVICE: 09/11/2022  DATE OF BIRTH:  [birth year]    PROGRESS NOTE    SUBJECTIVE:  Sheri Garza came in.  She is an unfortunate female.  She clearly has some dementia.  She had a hip fracture treated with open reduction and internal fixation early this spring.  She told me prior to her hip fracture, she was actually able to run with the high school kids next door and actually kept up running with them.  After her hip fracture now, she can barely walk.  She does not remember any other injury or trauma. She just says she is having swelling in both legs; however, she goes to the clinic and she says she does not do what they tell her.  She does not want to take medicines.  The next second she is telling me that she did not have scoliosis prior to her hip surgery.    OBJECTIVE:  Today, when I examined her, her hips have full range of motion.  She has good strength in all 4 planes.  She otherwise has normal alignment.  She has pitting edema of both lower extremities.    IMAGING:  I looked at her x-rays of the left hip.  I also got x-rays of the left foot and ankle which shows otherwise excellent alignment.  There are no fractures or dislocations.  She has a healed intertrochanteric hip fracture.    ASSESSMENT AND PLAN:  I showed the patient her back, actually she has horrible degenerative scoliosis with almost an 80 degree scoliosis.  She insisted that came after her hip fracture surgery.  At this point I realized she was not cognitively grasping the conversation and she was determined that her hip fracture surgery was the cause of her symptoms.  I told her to follow up with her PCP for her leg swelling and the pain clinic for her back.        Lester [place], MD                DD:  09/11/2022 21:56:56  DT:  09/12/2022 64:33:29 RO  D#:  [id number]

## 2022-09-12 NOTE — Progress Notes (Signed)
PATIENT NAME: Sheri Garza, Sheri Garza   HOSPITAL NUMBER:  [id number]  DATE OF SERVICE: 09/11/2022  DATE OF BIRTH:  1935-04-27    PROGRESS NOTE    SUBJECTIVE:  Sheri Garza came in for followup.  She is about 5 months out from an open reduction and fixation of a left intertrochanteric hip fracture.  She came in because she says she has bilateral lower extremity thought it was just related to the surgery, but then her right leg was swollen.  She is now still blaming it on the surgery.  She says she feels like her legs are weak and she has no energy.  She said prior to fixing her hip, she said she was able to run with the next door neighbor child who is 70, but then since this she has not been able to move very far.  She is in a walker.    OBJECTIVE:  When I examined her, she has no pain with range of motion of her left hip.  She has good strength in all 4 planes, both quadriceps, hamstrings, dorsiflexors, and plantarflexors.  She has pitting edema of both legs.    ASSESSMENT AND PLAN:  I told her she should go to the medical clinic and be evaluated for her heart, lungs, and kidneys.  I tried to explain to her the reasons for chronic edema in both lower extremities.  I doubt it was related to the surgery.  I once again reassured her it is not from the surgery.  Today we did get x-rays of the left foot and ankle which show no fractures or dislocations, just chronic edema.  Today, I also showed her back has almost an 80 degree curve and she has degenerative scoliosis.  She said she did not have that prior to her hip fracture.  I really got nowhere with this lady today and it was quite clear she is demented.  I explained to her that she needs to go to the Medical Clinic for evaluation of her chronic bilateral lower extremity edema.  She said she does not want to take any medicines.  I recommended she try some fluid fills.  I recommend she try an oral steroid.  She did not want to do any of these.  She really just came in, because she  wanted to talk about surgery and how she is now limited ever since her surgery.  I told her that the surgery for her intertrochanteric hip fracture is well healed and well aligned and is doing well.  I again assured her that her chronic problems are not related to her surgery of her left hip.  I answered all her questions.  I will see her back as needed.  Again, she really needs followup with her medical doctors.        Lester Carolina, MD                DD:  09/11/2022 22:02:29  DT:  09/12/2022 09:30:48 DW  D#:  [id number]

## 2022-10-22 ENCOUNTER — Encounter (INDEPENDENT_AMBULATORY_CARE_PROVIDER_SITE_OTHER): Payer: Medicare Other | Admitting: Foot Surgery

## 2022-10-27 ENCOUNTER — Encounter (HOSPITAL_COMMUNITY): Payer: Self-pay | Admitting: PHYSICIAN ASSISTANT

## 2022-10-27 DIAGNOSIS — R609 Edema, unspecified: Secondary | ICD-10-CM

## 2022-10-29 ENCOUNTER — Encounter (HOSPITAL_COMMUNITY): Payer: Self-pay | Admitting: PHYSICIAN ASSISTANT

## 2022-10-29 ENCOUNTER — Other Ambulatory Visit (HOSPITAL_COMMUNITY): Payer: Self-pay | Admitting: PHYSICIAN ASSISTANT

## 2022-10-29 DIAGNOSIS — R600 Localized edema: Secondary | ICD-10-CM

## 2022-10-29 DIAGNOSIS — R609 Edema, unspecified: Secondary | ICD-10-CM

## 2022-11-18 NOTE — Nursing Note (Signed)
Labs not collected.   Sheri Lewis, MA

## 2022-12-02 ENCOUNTER — Ambulatory Visit (HOSPITAL_COMMUNITY): Payer: Self-pay

## 2023-05-07 ENCOUNTER — Other Ambulatory Visit: Payer: Self-pay

## 2023-05-07 ENCOUNTER — Ambulatory Visit
Admission: RE | Admit: 2023-05-07 | Discharge: 2023-05-07 | Disposition: A | Discharge status: Home / Self Care | Payer: Self-pay | Source: Ambulatory Visit | Attending: PHYSICIAN ASSISTANT | Admitting: PHYSICIAN ASSISTANT

## 2023-05-07 DIAGNOSIS — R600 Localized edema: Secondary | ICD-10-CM | POA: Insufficient documentation

## 2023-05-10 DIAGNOSIS — I517 Cardiomegaly: Secondary | ICD-10-CM

## 2023-05-10 LAB — TRANSTHORACIC ECHOCARDIOGRAM - ADULT
AV LVOT peak gradient: 3 mmHg
AV mean gradient: 2 mmHg
Ao VTI: 19.9 cm
Ao peak vel: 115 cm/s
Aortic Valve Area by Continuity of Peak Velocity: 2.19 cm2
Aortic Valve Area by Continuity of VTI: 2.67 cm2
Ascending aorta: 2.9 cm
E wave decelartion time: 118 ms
E/A ratio: 0.8
E/E' ratio: 13.7
EF LOW: 60
EF: 65
LA Volume Index: 105.5 mL/m2
LA volume: 28.4 cm3
LVOT diameter: 1.9 cm
LVOT stroke volume: 53 cm3
Left Ventricular Outflow Tract Peak Velocity: 88.6 cm/s
Left ant tibial dis sys ratio: 2.5 cm
Left ant tibial dis sys ratio: 2.5 cm
MV Peak A Vel: 110 cm/s
MV Peak E Vel: 83.2 cm/s
MV stenosis pressure 1/2 time: 35 ms
Pulmonic Valve Acceleration Time: 140 ms
TDI: 6.09 cm/s
TDI: 6.09 cm/s

## 2023-08-06 ENCOUNTER — Ambulatory Visit (INDEPENDENT_AMBULATORY_CARE_PROVIDER_SITE_OTHER): Payer: Self-pay | Admitting: Student in an Organized Health Care Education/Training Program

## 2023-08-06 ENCOUNTER — Encounter (INDEPENDENT_AMBULATORY_CARE_PROVIDER_SITE_OTHER): Payer: Self-pay | Admitting: Student in an Organized Health Care Education/Training Program

## 2023-08-06 ENCOUNTER — Other Ambulatory Visit: Payer: Self-pay

## 2023-08-06 VITALS — BP 92/58 | HR 103 | Temp 98.6°F | Resp 18 | Ht 60.0 in | Wt 98.5 lb

## 2023-08-06 DIAGNOSIS — R600 Localized edema: Secondary | ICD-10-CM

## 2023-08-06 DIAGNOSIS — E785 Hyperlipidemia, unspecified: Secondary | ICD-10-CM

## 2023-08-06 DIAGNOSIS — R5381 Other malaise: Secondary | ICD-10-CM

## 2023-08-06 DIAGNOSIS — M810 Age-related osteoporosis without current pathological fracture: Secondary | ICD-10-CM

## 2023-08-06 DIAGNOSIS — Z9181 History of falling: Secondary | ICD-10-CM

## 2023-08-06 DIAGNOSIS — Z7689 Persons encountering health services in other specified circumstances: Secondary | ICD-10-CM

## 2023-08-06 MED ORDER — FUROSEMIDE 20 MG TABLET
20.0000 mg | ORAL_TABLET | Freq: Every day | ORAL | 1 refills | Status: DC
Start: 2023-08-06 — End: 2023-09-03

## 2023-08-06 NOTE — Progress Notes (Signed)
 [facility], [facility]  [address]  [place] [address]      Name: Sheri Garza MRN:  [id number]   Date: 08/06/2023 DOB:  03/20/1935 (88 y.o.)         OUTPATIENT PROGRESS NOTE    Subjective:   Patient ID:  Sheri Garza is a pleasant 88 y.o. female.    Chief Complaint: Establish Care and Hyperlipidemia      History of Present Illness:    Patient presents to clinic to establish care.    Has acute complaint of fluid retention  Or throat prescribed Lasix  in the past, but she has not taken it regularly.       Had ORIF of left intertrochanteric hip fracture    Had TTE on 05/10/23:  Left ventricular systolic function is normal.  EF 60-65%.  Mild LVH.  Mild diastolic dysfunction.  Mild MR.    Memory issues getting worse  Sometimes having trouble paying bills  Repeating conversations    The history is provided by the patient.      Allergies:   Allergies[1]      Medications:   acetaminophen  (TYLENOL ) 325 mg Oral Tablet, Take 2 Tablets (650 mg total) by mouth Every 4 hours as needed  Carboxymethylcell-Glycerin ,PF, (OPTIVE SENSITIVE, PF,) 0.5-0.9 % Ophthalmic Dropperette, Instill 1 Drop into both eyes Five times a day  vit C,E-Zn-coppr-lutein -zeaxan (PRESERVISION AREDS-2) 250-90-40-1 mg Oral Capsule, Take 1 Capsule by mouth Twice daily with food  furosemide  (LASIX ) 20 mg Oral Tablet, Lasix  20 MG Oral Tablet QTY: 5 tablet Days: 5 Refills: 0  Written: 05/22/22 Patient Instructions: one a day  Lactobacillus rhamnosus GG (CULTURELLE) 15 billion cell Oral Capsule, Sprinkle, Take 1 Capsule by mouth Twice daily with food (Patient not taking: Reported on 09/11/2022)  melatonin 3 mg Oral Tablet, Take 1 Tablet (3 mg total) by mouth Nightly as needed, may repeat one time (Patient not taking: Reported on 08/06/2023)    No facility-administered medications prior to visit.        Immunization History:     Immunization History   Administered Date(s) Administered    Covid-19 Vaccine,Pfizer Bivalent,77mcg/0.3ml,12 yrs+  12/28/2020    Covid-19 Vaccine,Pfizer-BioNTech,Purple Top,56yrs+ 04/28/2019, 05/19/2019, 11/30/2019, 05/31/2020    PPD(ADMIN) 03/18/2022, 03/27/2022         Past Medical History:     Past Medical History:   Diagnosis Date    Back pain     DDD (degenerative disc disease), cervical     Dry eye     Fracture of nasal bones     Hypertriglyceridemia     Insomnia     Kyphoscoliosis and scoliosis     Lumbar spinal stenosis     Osteoarthrosis     Osteoporosis     Spinal stenosis     Thyroid  nodule     Vitamin D  deficiency              Past Surgical History:     Past Surgical History:   Procedure Laterality Date    HX CATARACT REMOVAL Left     HX CHOLECYSTECTOMY      HX COLONOSCOPY      HX DILATION AND CURETTAGE      HX HIP SURGERY      HX OTHER      trigger finger, repair quad    HX TONSILLECTOMY      HX WISDOM TEETH EXTRACTION               Family History:  Family Medical History:       Problem Relation (Age of Onset)    Cancer Other, Father, Brother    Diabetes Other, Mother    HTN <20 y.o. Other    Heart Disease Mother                Social History:   Sheri Garza  reports that she has quit smoking. She has never used smokeless tobacco. She reports that she does not drink alcohol and does not use drugs.          Review of Systems: Other than ROS in HPI, all other systems are negative.      Objective:   Vitals:    Vitals:    08/06/23 1325   BP: (!) 92/58   Pulse: (!) 103   Resp: 18   Temp: 37 C (98.6 F)   TempSrc: Temporal   SpO2: 95%   Weight: 44.7 kg (98 lb 8.7 oz)   Height: 1.524 m (5')   BMI: 19.25          Body mass index is 19.25 kg/m.      Constitutional: Alert, well developed, well nourished  HEENT:  Head: NC/AT    Eyes: Sclera anicteric, conjunctiva not injected    Ears: EAC normal, bilateral TMs clear    Nose: No discharge    Throat: MMM, posterior pharynx without erythema or exudate  Neck:   Supple with normal ROM, no cervical LAD, no thyromegaly, no JVD, no carotid bruits  Cardiovascular: RRR, normal  S1/S2, no murmurs/rubs/gallops  Pulmonary:  CTAB, equal air entry, nonlabored, no wheezes/crackles/rhonchi  Abdomen:   NABS, NT/ND, soft, no HSM, no masses  Musculoskeletal:  No deformity, no injury, no edema  Neurological:   Alert, oriented x 3, no abnormal tone  Skin:     Warm, pink, dry, no rashes, no jaundice, no pallor, no cyanosis  Psychiatric:  Normal mood, affect, behavior, judgment, and thought content        Assessment & Plan:     1. Encounter to establish care  2. Hyperlipidemia, unspecified hyperlipidemia type (Primary)  Continue Diet control    3. Osteoporosis, unspecified osteoporosis type, unspecified pathological fracture presence  - VITAMIN D  25 TOTAL; Future    4. Lower extremity edema  - furosemide  (LASIX ) 20 mg Oral Tablet; Take 1 Tablet (20 mg total) by mouth Daily  Dispense: 90 Tablet; Refill: 1  - Refer to Home Health; Future  - COMPREHENSIVE METABOLIC PANEL, NON-FASTING; Future    5. Physical deconditioning  6. At high risk for falls  - Refer to [facility]; Future      Health Maintenance   Topic Date Due    Pneumococcal Vaccination, Age 31+ (1 of 1 - PCV) Never done    RSV Adult 60+ or Pregnancy (1 - 1-dose 75+ series) Never done    Medicare Annual Wellness Visit  10/03/2021    Covid-19 Vaccine (6 - 2024-25 season) 09/14/2022    Depression Screening  04/03/2023    Influenza Vaccine (1) 09/14/2023    Osteoporosis screening  09/27/2023    Adult Tdap-Td  Discontinued    Shingles Vaccine  Discontinued       Return in about 4 weeks (around 09/03/2023) for swelling follow up.    The patient has been educated and verbalized understanding regarding the services provided during this visit.      Jacquline Maltos, MD               [  1]   Allergies  Allergen Reactions    Antihistamine [Diphenhydramine Hcl] Swelling    Penicillins      Rash, Dizziness    Vancomycin  Other Adverse Reaction (Add comment)     Phlebitis    Flagyl [Metronidazole]     Gentamicin     Iodine And Iodide Containing Products      Lidocaine      Except xylocaine. Ok taking that    Other      Antihistamines and all caines, Pt states Xylocaine is ok    Tetracycline

## 2023-08-20 ENCOUNTER — Encounter (INDEPENDENT_AMBULATORY_CARE_PROVIDER_SITE_OTHER): Payer: Self-pay | Admitting: Student in an Organized Health Care Education/Training Program

## 2023-08-20 ENCOUNTER — Telehealth (INDEPENDENT_AMBULATORY_CARE_PROVIDER_SITE_OTHER): Payer: Self-pay | Admitting: Social Worker

## 2023-08-20 NOTE — Telephone Encounter (Signed)
 MSW received home health order for this patient. Referral being sent to [facility]). MSW notified [facility])   Liaison, Damien Buttner, of referral. MSW will continue to follow as otherwise needed.    Sheri Garza was provided with information about the following community resources:  Home Care:  Home Health Services    Solmon Cooler, MSW, LGSW  Ambulatory Social Services

## 2023-08-25 ENCOUNTER — Telehealth (INDEPENDENT_AMBULATORY_CARE_PROVIDER_SITE_OTHER): Payer: Self-pay | Admitting: Student in an Organized Health Care Education/Training Program

## 2023-08-25 NOTE — Telephone Encounter (Signed)
 Sheri Garza called from home health stating that he was doing an initial visit with the pt. He stated that she has a red area on the back of her left calf that is irritated and is red from her mid-calf down. Both of her legs are weeping and swollen. He stated that she is taking 20 mg of furosemide  daily. I informed Sheri Garza about this due to Dr. Cecelia not being here today. He asked if the pt could send in a picture of the area but the pt does not have access to MyChart at this time. Sheri Garza advised the pt to take 2 tabs total of 40 mg of furosemide  daily for 2-3 days until the swelling goes down and for the pt to check her weight daily. The pt is scheduled to see Dr. Cecelia 09/03/23. Sheri Garza stated that they would send a nurse out as well to check the area. Thamas Kipper, Ambulatory Care Assistant

## 2023-09-02 ENCOUNTER — Other Ambulatory Visit
Attending: Student in an Organized Health Care Education/Training Program | Admitting: Student in an Organized Health Care Education/Training Program

## 2023-09-02 ENCOUNTER — Other Ambulatory Visit: Payer: Self-pay

## 2023-09-02 ENCOUNTER — Ambulatory Visit (INDEPENDENT_AMBULATORY_CARE_PROVIDER_SITE_OTHER)

## 2023-09-02 DIAGNOSIS — R600 Localized edema: Secondary | ICD-10-CM

## 2023-09-02 DIAGNOSIS — M810 Age-related osteoporosis without current pathological fracture: Secondary | ICD-10-CM

## 2023-09-02 DIAGNOSIS — Z0189 Encounter for other specified special examinations: Secondary | ICD-10-CM

## 2023-09-02 LAB — COMPREHENSIVE METABOLIC PANEL, NON-FASTING
ALBUMIN: 3.4 g/dL (ref 3.4–4.8)
ALKALINE PHOSPHATASE: 127 U/L (ref 55–145)
ALT (SGPT): 10 U/L (ref <55)
ANION GAP: 8 mmol/L (ref 4–13)
AST (SGOT): 20 U/L (ref 8–41)
BILIRUBIN TOTAL: 0.3 mg/dL (ref 0.3–1.3)
BUN/CREA RATIO: 16 (ref 6–22)
BUN: 11 mg/dL (ref 8–25)
CALCIUM: 9.2 mg/dL (ref 8.5–10.2)
CHLORIDE: 104 mmol/L (ref 96–111)
CO2 TOTAL: 30 mmol/L (ref 22–32)
CREATININE: 0.68 mg/dL (ref 0.49–1.10)
ESTIMATED GFR: >60 mL/min/1.73mˆ2 (ref >60)
GLUCOSE: 84 mg/dL (ref 65–125)
POTASSIUM: 4.1 mmol/L (ref 3.5–5.1)
PROTEIN TOTAL: 6.3 g/dL (ref 6.0–8.0)
SODIUM: 142 mmol/L (ref 136–145)

## 2023-09-02 NOTE — Nursing Note (Signed)
 Department of Community Practice     Venipuncture performed in office on left arm antecubital vein, dry pressure dressing was applied to site and patient tolerated it well.  Specimen was centrifuged, aliquoted as needed and specimen was labeled and packaged for transport.    Thamas Kipper, Ambulatory Care Assistant  09/02/2023, 13:51

## 2023-09-03 ENCOUNTER — Ambulatory Visit (INDEPENDENT_AMBULATORY_CARE_PROVIDER_SITE_OTHER): Payer: Self-pay | Admitting: Student in an Organized Health Care Education/Training Program

## 2023-09-03 ENCOUNTER — Encounter (INDEPENDENT_AMBULATORY_CARE_PROVIDER_SITE_OTHER): Payer: Self-pay | Admitting: Student in an Organized Health Care Education/Training Program

## 2023-09-03 ENCOUNTER — Telehealth (INDEPENDENT_AMBULATORY_CARE_PROVIDER_SITE_OTHER): Payer: Self-pay | Admitting: Student in an Organized Health Care Education/Training Program

## 2023-09-03 VITALS — BP 104/56 | HR 90 | Temp 98.0°F | Resp 18 | Ht 60.0 in | Wt 102.3 lb

## 2023-09-03 DIAGNOSIS — R600 Localized edema: Secondary | ICD-10-CM

## 2023-09-03 MED ORDER — FUROSEMIDE 20 MG TABLET
40.0000 mg | ORAL_TABLET | Freq: Every day | ORAL | Status: AC
Start: 2023-09-03 — End: ?

## 2023-09-03 NOTE — Progress Notes (Signed)
 [facility]  [address]  [place] [address]      Name: Quinlan Mcfall MRN:  [id number]   Date: 09/03/2023 DOB:  28-Dec-1935 (88 y.o.)         OUTPATIENT PROGRESS NOTE    Subjective:   Patient ID:  Ms. Falconi is a pleasant 88 y.o. female.    Chief Complaint: Follow Up 4 Weeks      History of Present Illness:    Patient presents to clinic for 4 weeks swelling follow-up   Started on Lasix  20 mg daily over the past 4 weeks   Swelling has not improved much and is actually now up to above the knees  Patient reports that when taking an adhesive bandage off left leg, skin peeled off her leg  No purulent drainage or redness  Home health has been applying dressing to leg    Labs were obtained yesterday and showed normal BMP with normal potassium and kidney function    The history is provided by the patient.      Allergies:   Allergies[1]      Medications:   acetaminophen  (TYLENOL ) 325 mg Oral Tablet, Take 2 Tablets (650 mg total) by mouth Every 4 hours as needed  Carboxymethylcell-Glycerin ,PF, (OPTIVE SENSITIVE, PF,) 0.5-0.9 % Ophthalmic Dropperette, Instill 1 Drop into both eyes Five times a day  furosemide  (LASIX ) 20 mg Oral Tablet, Take 1 Tablet (20 mg total) by mouth Daily  vit C,E-Zn-coppr-lutein -zeaxan (PRESERVISION AREDS-2) 250-90-40-1 mg Oral Capsule, Take 1 Capsule by mouth Twice daily with food    No facility-administered medications prior to visit.        Immunization History:     Immunization History   Administered Date(s) Administered    Covid-19 Vaccine,Pfizer Bivalent,51mcg/0.3ml,12 yrs+ 12/28/2020    Covid-19 Vaccine,Pfizer-BioNTech,Purple Top,16yrs+ 04/28/2019, 05/19/2019, 11/30/2019, 05/31/2020    PPD(ADMIN) 03/18/2022, 03/27/2022         Past Medical History:     Past Medical History:   Diagnosis Date    Back pain     DDD (degenerative disc disease), cervical     Dry eye     Fracture of nasal bones     Hypertriglyceridemia     Insomnia     Kyphoscoliosis and scoliosis      Lumbar spinal stenosis     Osteoarthrosis     Osteoporosis     Spinal stenosis     Thyroid  nodule     Vitamin D  deficiency              Past Surgical History:     Past Surgical History:   Procedure Laterality Date    HX CATARACT REMOVAL Left     HX CHOLECYSTECTOMY      HX COLONOSCOPY      HX DILATION AND CURETTAGE      HX HIP SURGERY      HX OTHER      trigger finger, repair quad    HX TONSILLECTOMY      HX WISDOM TEETH EXTRACTION               Family History:     Family Medical History:       Problem Relation (Age of Onset)    Cancer Other, Father, Brother    Diabetes Other, Mother    HTN <20 y.o. Other    Heart Disease Mother                Social History:   Indie  Ashbaugh  reports that she has quit smoking. She has never used smokeless tobacco. She reports that she does not drink alcohol and does not use drugs.          Review of Systems: Other than ROS in HPI, all other systems are negative.      Objective:   Vitals:  There were no vitals filed for this visit.       There is no height or weight on file to calculate BMI.      Constitutional: Alert, well developed, well nourished  HEENT:  Head: NC/AT    Eyes: Sclera anicteric, conjunctiva not injected    Ears: EAC normal, bilateral TMs clear    Nose: No discharge    Throat: MMM, posterior pharynx without erythema or exudate  Neck:   Supple with normal ROM, no cervical LAD, no thyromegaly, no JVD, no carotid bruits  Cardiovascular: RRR, normal S1/S2, no murmurs/rubs/gallops  Pulmonary:  CTAB, equal air entry, nonlabored, no wheezes/crackles/rhonchi  Abdomen:   NABS, NT/ND, soft, no HSM, no masses  Musculoskeletal:  No deformity, no injury, 3+ bilateral lower extremity edema  Neurological:   Alert, oriented x 3, no abnormal tone  Skin:     Warm, pink, dry, no rashes, no jaundice, no pallor, no cyanosis  Psychiatric:  Normal mood, affect, behavior, judgment, and thought content    Results for orders placed or performed in visit on 09/02/23 (from the past 4 weeks)    COMPREHENSIVE METABOLIC PANEL, NON-FASTING    Collection Time: 09/02/23  1:20 PM   Result Value Ref Range    SODIUM 142 136 - 145 mmol/L    POTASSIUM 4.1 3.5 - 5.1 mmol/L    CHLORIDE 104 96 - 111 mmol/L    CO2 TOTAL 30 22 - 32 mmol/L    ANION GAP 8 4 - 13 mmol/L    BUN 11 8 - 25 mg/dL    CREATININE 9.31 9.50 - 1.10 mg/dL    BUN/CREA RATIO 16 6 - 22    ESTIMATED GFR >60 >60 mL/min/1.29m^2    ALBUMIN 3.4 3.4 - 4.8 g/dL    CALCIUM 9.2 8.5 - 89.7 mg/dL    GLUCOSE 84 65 - 874 mg/dL    ALKALINE PHOSPHATASE 127 55 - 145 U/L    ALT (SGPT) 10 <55 U/L    AST (SGOT) 20 8 - 41 U/L    BILIRUBIN TOTAL 0.3 0.3 - 1.3 mg/dL    PROTEIN TOTAL 6.3 6.0 - 8.0 g/dL    Narrative    Estimated Glomerular Filtration Rate (eGFR) is calculated using the CKD-EPI (2021) equation, intended for patients 17 years of age and older. If gender is not documented or unknown, there will be no eGFR calculation.       Assessment & Plan:     1. Lower extremity edema (Primary)  Chronic, worsening   Increase Lasix  to 40 mg daily  - furosemide  (LASIX ) 20 mg Oral Tablet; Take 2 Tablets (40 mg total) by mouth Daily  Applied Xeroform dressing to wound on left leg   No sign of infection  Home health to continue to monitor      Health Maintenance   Topic Date Due    Pneumococcal Vaccination, Age 29+ (1 of 1 - PCV) Never done    RSV Adult 60+ or Pregnancy (1 - 1-dose 75+ series) Never done    Medicare Annual Wellness Visit  10/03/2021    Covid-19 Vaccine (6 - 2024-25 season) 09/14/2022  Influenza Vaccine (1) 09/14/2023    Osteoporosis screening  09/27/2023    Depression Screening  09/02/2024    Adult Tdap-Td  Discontinued    Shingles Vaccine  Discontinued       Return in about 2 months (around 11/03/2023).    The patient has been educated and verbalized understanding regarding the services provided during this visit.      Jacquline Maltos, MD               [1]   Allergies  Allergen Reactions    Antihistamine [Diphenhydramine Hcl] Swelling    Penicillins       Rash, Dizziness    Vancomycin  Other Adverse Reaction (Add comment)     Phlebitis    Flagyl [Metronidazole]     Gentamicin     Iodine And Iodide Containing Products     Lidocaine      Except xylocaine. Ok taking that    Other      Antihistamines and all caines, Pt states Xylocaine is ok    Tetracycline

## 2023-09-03 NOTE — Nursing Note (Signed)
 09/03/23 1107   Depression Screen   Little interest or pleasure in doing things. 0   Feeling down, depressed, or hopeless 0   PHQ 2 Total 0

## 2023-09-03 NOTE — Telephone Encounter (Signed)
 Received call from the lab stating the patient's Vitamin D  lab was not spun down, so it will need to be redrawn.    Sheri Garza

## 2023-09-07 ENCOUNTER — Ambulatory Visit (INDEPENDENT_AMBULATORY_CARE_PROVIDER_SITE_OTHER): Payer: Self-pay | Admitting: Student in an Organized Health Care Education/Training Program

## 2023-11-04 ENCOUNTER — Ambulatory Visit (INDEPENDENT_AMBULATORY_CARE_PROVIDER_SITE_OTHER): Payer: Self-pay | Admitting: Student in an Organized Health Care Education/Training Program

## 2023-11-04 ENCOUNTER — Encounter (INDEPENDENT_AMBULATORY_CARE_PROVIDER_SITE_OTHER): Payer: Self-pay | Admitting: Student in an Organized Health Care Education/Training Program

## 2023-11-04 ENCOUNTER — Other Ambulatory Visit: Payer: Self-pay

## 2023-11-04 VITALS — BP 84/50 | HR 89 | Temp 98.2°F | Resp 18 | Ht 60.0 in | Wt 100.1 lb

## 2023-11-04 DIAGNOSIS — R413 Other amnesia: Secondary | ICD-10-CM

## 2023-11-04 NOTE — Progress Notes (Signed)
 [facility]  [address]  [place] [address]      Name: Sheri Garza MRN:  [id number]   Date: 11/04/2023 DOB:  1935-06-23 (88 y.o.)         OUTPATIENT PROGRESS NOTE    Subjective:   Patient ID:  Ms. Sheri Garza is a pleasant 88 y.o. female.    Chief Complaint: Follow Up 8 Weeks      History of Present Illness:    Patient presents to clinic with granddaughter for follow-up  Patient reports that things are going well  Granddaughter has concerns about her memory and visual hallucinations  They both state that she is having trouble with managing bills and can longer drive  She is currently living at home by herself  Patient reports that people come into her house and move and steal things  She will then call her granddaughter and granddaughter will be able to find the items  Family has also added a camera to the house and changed locks to make sure that no one is breaking into the house        The history is provided by the patient.      Allergies:   Allergies[1]      Medications:   acetaminophen  (TYLENOL ) 325 mg Oral Tablet, Take 2 Tablets (650 mg total) by mouth Every 4 hours as needed (Patient not taking: Reported on 11/04/2023)  Carboxymethylcell-Glycerin ,PF, (OPTIVE SENSITIVE, PF,) 0.5-0.9 % Ophthalmic Dropperette, Instill 1 Drop into both eyes Five times a day  furosemide  (LASIX ) 20 mg Oral Tablet, Take 2 Tablets (40 mg total) by mouth Daily  vit C,E-Zn-coppr-lutein -zeaxan (PRESERVISION AREDS-2) 250-90-40-1 mg Oral Capsule, Take 1 Capsule by mouth Twice daily with food    No facility-administered medications prior to visit.        Immunization History:     Immunization History   Administered Date(s) Administered    Covid-19 Vaccine,Pfizer Bivalent,40mcg/0.3ml,12 yrs+ 12/28/2020    Covid-19 Vaccine,Pfizer-BioNTech,Purple Top,49yrs+ 04/28/2019, 05/19/2019, 11/30/2019, 05/31/2020    PPD(ADMIN) 03/18/2022, 03/27/2022         Past Medical History:     Past Medical History:   Diagnosis Date     Back pain     DDD (degenerative disc disease), cervical     Dry eye     Fracture of nasal bones     Hypertriglyceridemia     Insomnia     Kyphoscoliosis and scoliosis     Lumbar spinal stenosis     Osteoarthrosis     Osteoporosis     Spinal stenosis     Thyroid  nodule     Vitamin D  deficiency              Past Surgical History:     Past Surgical History:   Procedure Laterality Date    HX CATARACT REMOVAL Left     HX CHOLECYSTECTOMY      HX COLONOSCOPY      HX DILATION AND CURETTAGE      HX HIP SURGERY      HX OTHER      trigger finger, repair quad    HX TONSILLECTOMY      HX WISDOM TEETH EXTRACTION               Family History:     Family Medical History:       Problem Relation (Age of Onset)    Cancer Other, Father, Brother    Diabetes Other, Mother    HTN <20  y.o. Other    Heart Disease Mother                Social History:   Sheri Garza  reports that she has quit smoking. She has never used smokeless tobacco. She reports that she does not drink alcohol and does not use drugs.          Review of Systems: Other than ROS in HPI, all other systems are negative.      Objective:   Vitals:    Vitals:    11/04/23 1146   BP: (!) 84/50   Pulse: 89   Resp: 18   Temp: 36.8 C (98.2 F)   TempSrc: Temporal   SpO2: 92%   Weight: 45.4 kg (100 lb 1.4 oz)   Height: 1.524 m (5')   BMI: 19.55          Body mass index is 19.55 kg/m.      Constitutional: Alert, well developed, well nourished  HEENT:  Head: NC/AT    Eyes: Sclera anicteric, conjunctiva not injected    Ears: EAC normal, bilateral TMs clear    Nose: No discharge    Throat: MMM, posterior pharynx without erythema or exudate  Neck:   Supple with normal ROM, no cervical LAD, no thyromegaly, no JVD, no carotid bruits  Cardiovascular: RRR, normal S1/S2, no murmurs/rubs/gallops  Pulmonary:  CTAB, equal air entry, nonlabored, no wheezes/crackles/rhonchi  Abdomen:   NABS, NT/ND, soft, no HSM, no masses  Musculoskeletal:  No deformity, no injury, no edema  Neurological:    Alert, oriented x 3, no abnormal tone  Skin:     Warm, pink, dry, no rashes, no jaundice, no pallor, no cyanosis  Psychiatric:  Normal mood, affect, behavior, judgment, and thought content        Assessment & Plan:     1. Memory deficit (Primary)  - AMB CONSULT/REFERRAL NEUROPSYCH TESTING; Future      Health Maintenance   Topic Date Due    Pneumococcal Vaccination, Age 24+ (1 of 1 - PCV) Never done    RSV Adult 60+ or Pregnancy (1 - 1-dose 75+ series) Never done    Medicare Annual Wellness Visit  09/20/2020    Influenza Vaccine (1) Never done    Covid-19 Vaccine (Shared decision making) (6 - 2025-26 season) 09/14/2023    Osteoporosis screening  09/27/2023    Depression Screening  09/02/2024    Adult Tdap-Td  Discontinued    Shingles Vaccine  Discontinued       Return in about 3 months (around 02/04/2024).    The patient has been educated and verbalized understanding regarding the services provided during this visit.      Jacquline Maltos, MD               [1]   Allergies  Allergen Reactions    Antihistamine [Diphenhydramine Hcl] Swelling    Penicillins      Rash, Dizziness    Vancomycin  Other Adverse Reaction (Add comment)     Phlebitis    Flagyl [Metronidazole]     Gentamicin     Iodine And Iodide Containing Products     Lidocaine      Except xylocaine. Ok taking that    Other      Antihistamines and all caines, Pt states Xylocaine is ok    Tetracycline

## 2023-11-19 ENCOUNTER — Encounter (INDEPENDENT_AMBULATORY_CARE_PROVIDER_SITE_OTHER): Payer: Self-pay | Admitting: Student in an Organized Health Care Education/Training Program

## 2023-11-20 ENCOUNTER — Telehealth (INDEPENDENT_AMBULATORY_CARE_PROVIDER_SITE_OTHER): Payer: Self-pay

## 2023-12-01 ENCOUNTER — Other Ambulatory Visit: Payer: Self-pay

## 2023-12-01 ENCOUNTER — Telehealth (INDEPENDENT_AMBULATORY_CARE_PROVIDER_SITE_OTHER): Payer: Self-pay

## 2023-12-07 ENCOUNTER — Encounter (INDEPENDENT_AMBULATORY_CARE_PROVIDER_SITE_OTHER): Payer: Self-pay | Admitting: Student in an Organized Health Care Education/Training Program

## 2023-12-28 ENCOUNTER — Telehealth (INDEPENDENT_AMBULATORY_CARE_PROVIDER_SITE_OTHER): Payer: Self-pay

## 2023-12-28 NOTE — Telephone Encounter (Signed)
 12/15:  Called pt to schedule NP eval.  NA, LVM akl

## 2024-02-01 ENCOUNTER — Other Ambulatory Visit: Payer: Self-pay

## 2024-02-01 ENCOUNTER — Ambulatory Visit: Attending: Clinical Neuropsychologist | Admitting: Clinical Neuropsychologist

## 2024-02-01 DIAGNOSIS — F0150 Vascular dementia without behavioral disturbance: Secondary | ICD-10-CM | POA: Insufficient documentation

## 2024-02-01 DIAGNOSIS — F0280 Dementia in other diseases classified elsewhere without behavioral disturbance: Secondary | ICD-10-CM | POA: Insufficient documentation

## 2024-02-01 DIAGNOSIS — R413 Other amnesia: Secondary | ICD-10-CM | POA: Insufficient documentation

## 2024-02-01 DIAGNOSIS — G309 Alzheimer's disease, unspecified: Secondary | ICD-10-CM | POA: Insufficient documentation

## 2024-02-01 NOTE — Neuropsychology Assessment (Signed)
 NEUROPSYCHOLOGICAL EVALUATION    PATIENT NAME: Sheri Garza  MEDICAL RECORD NUMBER: [id number]  DATE OF SERVICE: 02/01/2024  DATE OF BIRTH: 1935-06-26  TIME IN/OUT: 8:20 - 10:30 am    CPT/CODE:  09208 = 1 hour (8:20 - 9:00 am interview and evaluation, 9:00 - 9:45 am interview with collateral)  96132 = 1 hour (record review, clinical decision making, analysis)   03866 = 1 hour (integration, report)   03861 = 1 unit + 96139 = 3 units (9:04 - 10:30 am test administration with technician [facility] + scoring)       REFERRING PROVIDER:     Cecelia Standing, MD  [address]  [place],  [place] [address]     REFERRAL INFORMATION:     Amarionna Arca is a 89 y.o., right handed, White female was referred for a neuropsychological assessment by Standing Cecelia, MD due to concerns for dementia.  She was accompanied to the evaluation by granddaughter Annah and grandson.      PRESENTING CONCERNS:     Cognitive symptoms, course, and current functioning:     Per patient, it's hard to remember, particularly when she get interrupted and it is harder to learn new things. Per family, she had gradual decline in cognition prior to her hip fracture but since that time, she has become noticeable worse.  She is having significant difficulties with her memory.    Behaviorally, she has had delusions of a medical student that has entered her home and staying there. She talks about multiple animals (has one cat), and she has food and water bowls all over the home, resulting in 15-20 feeding stations.  She has started mixing up her nights and days, mostly going to bed around 3-4 am as she is shuffling piles of papers, reading things over and over again.     Functionally, she is struggling to handle her bills and several services have been cancelled due to the financial disarray. She has also started cancelling her credit cards as she has delusions that they are stealing from her and they can decided how I can use my cards if she has the cards. She has  also claimed that someone have stolen my thermostat. She is very concerned about taking medications and thus are not adherent to what has been prescribed for her. She has not driven since her hip fracture. Her family is reporting that she appears to have sundowning.      SUBSTANCE USE:    Alcohol: denied   Tobacco: remote use   Other: denied     MEDICAL & PSYCHIATRIC HISTORY:   Problem List[1]     DIAGNOSTICS:      NEUROIMAGING: N/A      MEDICATIONS:   Current Medications[2]     FAMILY MEDICAL & PSYCHIATRIC HISTORY:      Problem Relation (Age of Onset)    Cancer Other, Father, Brother    Diabetes Other, Mother    HTN <20 y.o. Other    Heart Disease Mother     SOCIAL HISTORY:     Education: The patient completed a Bachelor's degree in accounting, with grades generally being A's; they denied a history of learning difficulties, attention problems, or behavioral problems.   Military service: N/A  Occupation: accounting, retired   Relationships: widowed last year, but actually 18 years ago. Lives by herself in a five room, slit level house.     BEHAVIORAL OBSERVATIONS:     Appearance: casually dressed and well groomed  Motor functioning/gait:  No gross or fine motor abnormalities  Hearing & Vision: intact for the purposes of evaluation with the use of glasses   Speech: Normal volume, rate, and rhythm   Receptive and expressive language: reduced comprehension and needed multiple clarifications throughout; tangential   Mood: irritable and anxious   Affect: mood congruent  Social comportment/Behavior: Normal eye contact and range of facial expression, distracted, defensive   Insight: Impaired,  Validity of examination: Overall, she seemed engaged, and performance on embedded measures of task engagement was adequate. Consequently, the current evaluation is believed to be a valid assessment of her cognitive and neurobehavioral functioning.    RESULTS:      Neurobehavioral exam and cognitive screening:       Alertness:  alert  Orientation: Disoriented to weekday, month, time and place part of a school.     Right-left orientation: intact  Grasp reflex:  Subtle grasp reflex on the right  Glabellar tap reflex:  Does not extinguish appropriately  Gaze:  Normal  Loops:  Mildly perseverative  Cross construction:  Mildly impaired  Motor sequencing:  Unable to perform using her right dominant hand despite simultaneous modeling and verbal cues  Praxis:  Patient refused    Cognitive screening (MMSE) was 19/30 where she lost points for orientation to time and place, attention, recall (1/3), and repetition.     Premorbid functioning:  Single word reading suggested average premorbid functioning, consistent with her educational and occupational achievement.     Visuoperception and visuoconstruction:   Visuospatial judgment was within normal limits. Simple visual construction was mildly impaired.     Language and reasoning:   Spontaneous speech was fluent, with some paraphasic errors and word-finding pauses. The patient could follow a complex 3-step command without midline crossing. She was able to repeat some simple phrases but struggled with some lengthier and complex ones. Basic auditory comprehension was intact though she had some difficulties with task instructions.. Confrontation naming was low average with some paraphasic errors. Phonemic and semantic fluency were mildly to moderately impaired. Verbal abstract reasoning was mildly impaired and somewhat concrete.      Attention, processing speed, and executive functioning:    Basic auditory attention span was average with mildly impaired divided attention.  Her sequencing ability was severely impaired. Processing speed and sequencing was mildly impaired but when a mental flexibility component was added, she was unable to complete the task and was confused and the task was subsequently discontinued.       Learning and memory:    Verbal learning through repetition was severely impaired (2,  2, 3, 3 out of 9 words).  While she was able to retrieve two words after a short delay, she was unable to retrieve any words after a longer delay without the expected benefits from cues.  Consolidation was also below expectations, endorsing eight of the nine words on the list but also including to false positives.  Procedural memory was maintained.     Mood:   On self-report measures, the patient did not endorse significant symptoms of depression with a score of 3/15 on the Geriatric depression scale, short form.    Functional Skills:  Due to her reported difficulties with financial management, brief functional measure related to financial skills was administered.  All the measure, she was able to accurately identify currency and write a check although she had asked twice how much he was supposed to pay.  However, in contrast she struggled to count change, was unable to give change  for a five dollar bill, and she was unable to balance a checkbook.  This is in stark contrast to her previous abilities as she was a skilled accountant in the past.    IMPRESSIONS:      The patient is a 89 y.o., right handed, White female with a Bachelor's degree in accounting.  She was referred for neuropsychological evaluation due to concerns for cognitive decline with functional impact.  Relative to her average pre-morbid general functioning, the evaluation revealed cognitive deficits ranging from mildly to severely impaired.  Specifically she was shown to have changes to semantic language including some impairment in comprehension, repetition, and verbal fluency, with a relative to decline in naming from premorbid status.  Additionally she had severe deficits in mental flexibility and sequencing, with mild declines in abstract reasoning and processing speed.  Perhaps most notable was her impairments in new learning and memory for verbal information although she showed some benefits from a recognition format.  Behaviorally, she has  been noted to have delusions and hallucinations on occasion.  It is also clear that she has poor insight into her level of cognitive decline and functional impact from this.    Given her cognitive difficulties and decline in IADLs, the patient currently meets criteria for a Major Neurocognitive Disorder.  The etiology of her dementia is likely mixed with dementia of the Alzheimer's type being very high on the differential though she likely also have some impact from white matter ischemic changes based on her cognitive evaluation alone.      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   Given her cognitive deficits, she may also not understand the need for such assistance nor the level of assistance necessary.Consequently, family members or other trusted individuals are encouraged to aid the patient in making decisions about her long term care needs.     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  Of cores family should be mindful to have her be involved to the extent possible with making decisions about her finances and property.    Given her receptive language problems, her family, medical providers, and others should attempt to use simplified language whenever possible, and her family should accompany her to all appointments to assist with information sharing.     The patient is encouraged to engage in activities and behaviors that research has shown to be beneficial to brain health, including physical activity,  eating a heart healthy diet, and remaining cognitively and socially active.     Given the diagnostic impression of a neurodegenerative condition, continued decline is expected over time.  The patient did family are encouraged to contact the [employer] for additional education and support.      Thank you for allowing us  to participate in the care of this patient. Please let us  know if we can be of any further assistance.     Sherryn Pollino E. Ulises Wolfinger, Psy.D., ABPP-CN  Professor    [facility]  This note was partially dictated using voice recognition software which may lead to minor errors in grammar and/or syntax that escape proof reading.                    [1]   Patient Active Problem List  Diagnosis    Right Knee OA    Thyroid  nodule    Spinal stenosis    Hyperlipidemia    Avitaminosis D    Back pain    DDD (degenerative disc disease), cervical    Insomnia    Kyphoscoliosis and scoliosis    Lumbar spinal stenosis    Osteoarthrosis    Aftercare for healing traumatic closed fracture of left hip    Acute blood loss as cause of postoperative anemia    Atrophy of muscle of multiple sites    Physical deconditioning    Orthopedic aftercare    At risk for malnutrition    Left hip pain    Swelling of left lower extremity    On deep vein thrombosis (DVT) prophylaxis    Edema of left lower extremity    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    Femur fracture, left    Osteoporosis   [2]   Current Outpatient Medications:     acetaminophen  (TYLENOL ) 325 mg Oral Tablet, Take 2 Tablets (650 mg total) by mouth Every 4 hours as needed (Patient not taking: Reported on 11/04/2023), Disp: , Rfl:     Carboxymethylcell-Glycerin ,PF, (OPTIVE SENSITIVE, PF,) 0.5-0.9 % Ophthalmic Dropperette, Instill 1 Drop into both eyes Five times a day, Disp: 1 Each, Rfl: 0    furosemide  (LASIX ) 20 mg Oral Tablet, Take 2 Tablets (40 mg total) by mouth Daily, Disp: , Rfl:     vit C,E-Zn-coppr-lutein -zeaxan (PRESERVISION  AREDS-2) 250-90-40-1 mg Oral Capsule, Take 1 Capsule by mouth Twice daily with food, Disp: , Rfl:

## 2024-02-12 DIAGNOSIS — F0150 Vascular dementia without behavioral disturbance: Secondary | ICD-10-CM | POA: Insufficient documentation

## 2024-03-02 ENCOUNTER — Ambulatory Visit (INDEPENDENT_AMBULATORY_CARE_PROVIDER_SITE_OTHER): Payer: Self-pay | Admitting: Clinical Neuropsychologist

## 2024-03-07 ENCOUNTER — Ambulatory Visit (INDEPENDENT_AMBULATORY_CARE_PROVIDER_SITE_OTHER): Payer: Self-pay | Admitting: Clinical Neuropsychologist
